# Patient Record
Sex: Male | Born: 1980
Health system: Southern US, Community
[De-identification: ages and names within clinical notes are randomized; demographics above are authoritative.]

## PROBLEM LIST (undated history)

## (undated) DIAGNOSIS — R000 Tachycardia, unspecified: Secondary | ICD-10-CM

## (undated) DIAGNOSIS — R569 Unspecified convulsions: Secondary | ICD-10-CM

## (undated) DIAGNOSIS — A419 Sepsis, unspecified organism: Secondary | ICD-10-CM

## (undated) DIAGNOSIS — E0590 Thyrotoxicosis, unspecified without thyrotoxic crisis or storm: Secondary | ICD-10-CM

## (undated) DIAGNOSIS — F419 Anxiety disorder, unspecified: Secondary | ICD-10-CM

---
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
## 2002-04-24 ENCOUNTER — Encounter: Payer: Self-pay | Admitting: Emergency Medicine

## 2002-04-24 ENCOUNTER — Emergency Department (HOSPITAL_COMMUNITY): Admission: EM | Admit: 2002-04-24 | Discharge: 2002-04-24 | Payer: Self-pay | Admitting: Emergency Medicine

## 2002-05-12 ENCOUNTER — Emergency Department (HOSPITAL_COMMUNITY): Admission: EM | Admit: 2002-05-12 | Discharge: 2002-05-12 | Payer: Self-pay | Admitting: *Deleted

## 2002-05-16 ENCOUNTER — Emergency Department (HOSPITAL_COMMUNITY): Admission: EM | Admit: 2002-05-16 | Discharge: 2002-05-16 | Payer: Self-pay | Admitting: Emergency Medicine

## 2007-06-11 ENCOUNTER — Emergency Department: Payer: Self-pay | Admitting: Emergency Medicine

## 2007-06-14 ENCOUNTER — Emergency Department (HOSPITAL_COMMUNITY): Admission: EM | Admit: 2007-06-14 | Discharge: 2007-06-14 | Payer: Self-pay | Admitting: Emergency Medicine

## 2009-05-04 ENCOUNTER — Emergency Department (HOSPITAL_COMMUNITY): Admission: EM | Admit: 2009-05-04 | Discharge: 2009-05-04 | Payer: Self-pay | Admitting: Emergency Medicine

## 2009-10-13 ENCOUNTER — Emergency Department (HOSPITAL_COMMUNITY): Admission: EM | Admit: 2009-10-13 | Discharge: 2009-10-13 | Payer: Self-pay | Admitting: Emergency Medicine

## 2010-04-25 LAB — WOUND CULTURE

## 2010-05-05 LAB — GC/CHLAMYDIA PROBE AMP, GENITAL
Chlamydia, DNA Probe: NEGATIVE
GC Probe Amp, Genital: NEGATIVE

## 2010-11-06 LAB — COMPREHENSIVE METABOLIC PANEL
ALT: 16
AST: 21
CO2: 26
Calcium: 9.1
Chloride: 109
GFR calc Af Amer: >60
GFR calc non Af Amer: >60
Potassium: 4.1
Sodium: 140

## 2010-11-06 LAB — DIFFERENTIAL
Eosinophils Absolute: 0
Eosinophils Relative: 0
Lymphs Abs: 1.4

## 2010-11-06 LAB — ETHANOL: Alcohol, Ethyl (B): <5

## 2010-11-06 LAB — RAPID URINE DRUG SCREEN, HOSP PERFORMED
Amphetamines: NOT DETECTED
Barbiturates: NOT DETECTED
Benzodiazepines: POSITIVE — AB
Cocaine: NOT DETECTED
Opiates: NOT DETECTED

## 2010-11-06 LAB — CBC
RBC: 4.52
WBC: 10.9 — ABNORMAL HIGH

## 2011-08-11 ENCOUNTER — Emergency Department (HOSPITAL_COMMUNITY)
Admission: EM | Admit: 2011-08-11 | Discharge: 2011-08-11 | Disposition: A | Discharge status: Home / Self Care | Payer: Self-pay | Attending: Emergency Medicine | Admitting: Emergency Medicine

## 2011-08-11 ENCOUNTER — Encounter (HOSPITAL_COMMUNITY): Payer: Self-pay | Admitting: *Deleted

## 2011-08-11 DIAGNOSIS — S30860A Insect bite (nonvenomous) of lower back and pelvis, initial encounter: Secondary | ICD-10-CM | POA: Insufficient documentation

## 2011-08-11 DIAGNOSIS — R21 Rash and other nonspecific skin eruption: Secondary | ICD-10-CM | POA: Insufficient documentation

## 2011-08-11 DIAGNOSIS — L0390 Cellulitis, unspecified: Secondary | ICD-10-CM

## 2011-08-11 DIAGNOSIS — W57XXXA Bitten or stung by nonvenomous insect and other nonvenomous arthropods, initial encounter: Secondary | ICD-10-CM | POA: Insufficient documentation

## 2011-08-11 LAB — GLUCOSE, CAPILLARY: Glucose-Capillary: 94 mg/dL (ref 70–99)

## 2011-08-11 MED ORDER — DOXYCYCLINE HYCLATE 100 MG PO TABS: 100.0000 mg | ORAL_TABLET | Freq: Two times a day (BID) | ORAL | Status: AC

## 2011-08-11 NOTE — ED Notes (Signed)
Removed a tick from RLQ over 1 week ago, now has red , itching areas to abd.  No NVD , or fever.

## 2011-08-11 NOTE — ED Provider Notes (Signed)
History   This chart was scribed for Ronald Lyons, MD by Sofie Rower. The patient was seen in room APA03/APA03 and the patient's care was started at 11:54 AM     CSN: [id number]  Arrival date & time 08/11/11  1120   First MD Initiated Contact with Patient 08/11/11 1144      Chief Complaint  Patient presents with  . Tick Removal    (Consider location/radiation/quality/duration/timing/severity/associated sxs/prior treatment) HPI  PREM COYKENDALL is a 31 y.o. male who presents to the Emergency Department complaining of moderate, episodic rash located at the RLQ onset seven days ago with associated symptoms of itching, erythema. The pt removed a tick one week ago which was located at the RLQ. The pt informs the EDP that the bite location has swelled up and begun itching, progressively becoming worse. The pt also complains of moderate, episodic weakness. The pt would like to have blood work evaluated.  Pt has a hx of allergy to penicillin.   Pt denies fever, headache.       History reviewed. No pertinent past medical history.  History reviewed. No pertinent past surgical history.  History reviewed. No pertinent family history.  History  Substance Use Topics  . Smoking status: Never Smoker   . Smokeless tobacco: Not on file  . Alcohol Use: No      Review of Systems  All other systems reviewed and are negative.    10 Systems reviewed and all are negative for acute change except as noted in the HPI.    Allergies  Penicillins  Home Medications  No current outpatient prescriptions on file.  BP 119/66  Pulse 76  Temp 98.1 F (36.7 C) (Oral)  Resp 18  Ht 5\' 4"  (1.626 m)  Wt 150 lb (68.04 kg)  BMI 25.75 kg/m2  SpO2 100%  Physical Exam  Nursing note and vitals reviewed. Constitutional: He appears well-developed and well-nourished.  HENT:  Head: Atraumatic.  Right Ear: External ear normal.  Left Ear: External ear normal.  Nose: Nose normal.  Musculoskeletal:  Normal range of motion.  Neurological: He is alert.  Skin: Skin is warm and dry. Rash (Located at the RLQ.) noted.       Macular, raised, erythematous rash located at the RLQ. Further area of warmth and erythema located at the suprapubic region.   Psychiatric: He has a normal mood and affect. His behavior is normal.    ED Course  Procedures (including critical care time)  DIAGNOSTIC STUDIES: Oxygen Saturation is 100% on room air, normal by my interpretation.    COORDINATION OF CARE:    11:53AM- EDP at bedside discusses treatment plan concerning treatment of rash with antibiotics.   12:13PM- EDP at bedside discusses results of blood work.   Labs Reviewed - No data to display No results found.   No diagnosis found.    MDM  Has rash where tick was attached.  Does not appear to be in a bullseye configuration, however will treat with doxy.  To return prn if worsens.      I personally performed the services described in this documentation, which was scribed in my presence. The recorded information has been reviewed and considered.      Ronald Lyons, MD 08/12/11 [phone]

## 2011-08-11 NOTE — Discharge Instructions (Signed)
Wood Tick Bite Ticks are insects that attach themselves to the skin. Most tick bites are harmless, but sometimes ticks carry diseases that can make a person quite ill. The chance of getting ill depends on:  The kind of tick that bites you.   Time of year.   How long the tick is attached.   Geographic location.  Wood ticks are also called dog ticks. They are generally black. They can have white markings. They live in shrubs and grassy areas. They are larger than deer ticks. Wood ticks are about the size of a watermelon seed. They have a hard body. The most common places for ticks to attach themselves are the scalp, neck, armpits, waist, and groin. Wood ticks may stay attached for up to 2 weeks. TICKS MUST BE REMOVED AS SOON AS POSSIBLE TO HELP PREVENT DISEASES CAUSED BY TICK BITES.  TO REMOVE A TICK: 1. If available, put on latex gloves before trying to remove a tick.  2. Grasp the tick as close to the skin as possible, with curved forceps, fine tweezers or a special tick removal tool.  3. Pull gently with steady pressure until the tick lets go. Do not twist the tick or jerk it suddenly. This may break off the tick's head or mouth parts.  4. Do not crush the tick's body. This could force disease-carrying fluids from the tick into your body.  5. After the tick is removed, wash the bite area and your hands with soap and water or other disinfectant.  6. Apply a small amount of antiseptic cream or ointment to the bite site.  7. Wash and disinfect any instruments that were used.  8. Save the tick in a jar or plastic bag for later identification. Preserve the tick with a bit of alcohol or put it in the freezer.  9. Do not apply a hot match, petroleum jelly, or fingernail polish to the tick. This does not work and may increase the chances of disease from the tick bite.  YOU MAY NEED TO SEE YOUR CAREGIVER FOR A TETANUS SHOT NOW IF:  You have no idea when you had the last one.   You have never had a  tetanus shot before.  If you need a tetanus shot, and you decide not to get one, there is a rare chance of getting tetanus. Sickness from tetanus can be serious. If you get a tetanus shot, your arm may swell, get red and warm to the touch at the shot site. This is common and not a problem. PREVENTION  Wear protective clothing. Long sleeves and pants are best.   Wear white clothes to see ticks more easily   Tuck your pant legs into your socks.   If walking on trail, stay in the middle of the trail to avoid brushing against bushes.   Put insect repellent on all exposed skin and along boot tops, pant legs and sleeve cuffs   Check clothing, hair and skin repeatedly and before coming inside.   Brush off any ticks that are not attached.  SEEK MEDICAL CARE IF:   You cannot remove a tick or part of the tick that is left in the skin.   Unexplained fever.   Redness and swelling in the area of the tick bite.   Tender, swollen lymph glands.   Diarrhea.   Weight loss.   Cough.   Fatigue.   Muscle, joint or bone pain.   Belly pain.   Headache.   Rash.    SEEK IMMEDIATE MEDICAL CARE IF:   You develop an oral temperature above 102 F (38.9 C).   You are having trouble walking or moving your legs.   Numbness in the legs.   Shortness of breath.   Confusion.   Repeated vomiting.  Document Released: 01/25/2000 Document Revised: 01/16/2011 Document Reviewed: 01/03/2008 ExitCare Patient Information 2012 [employer]. 

## 2013-03-13 ENCOUNTER — Emergency Department (HOSPITAL_COMMUNITY)
Admission: EM | Admit: 2013-03-13 | Discharge: 2013-03-13 | Discharge status: Against Medical Advice | Payer: Self-pay | Attending: Emergency Medicine | Admitting: Emergency Medicine

## 2013-03-13 ENCOUNTER — Encounter (HOSPITAL_COMMUNITY): Payer: Self-pay | Admitting: Emergency Medicine

## 2013-03-13 DIAGNOSIS — F17200 Nicotine dependence, unspecified, uncomplicated: Secondary | ICD-10-CM | POA: Insufficient documentation

## 2013-03-13 DIAGNOSIS — R300 Dysuria: Secondary | ICD-10-CM | POA: Insufficient documentation

## 2013-03-13 NOTE — ED Notes (Signed)
Pt left without being seen after Triage 

## 2013-03-13 NOTE — ED Notes (Signed)
Burning with urination x 3-4 days.  Denies abd pain, denies frequency.

## 2014-02-21 ENCOUNTER — Encounter (HOSPITAL_COMMUNITY): Payer: Self-pay | Admitting: Cardiology

## 2014-02-21 ENCOUNTER — Emergency Department (HOSPITAL_COMMUNITY)
Admission: EM | Admit: 2014-02-21 | Discharge: 2014-02-21 | Disposition: A | Discharge status: Home / Self Care | Payer: Self-pay | Attending: Emergency Medicine | Admitting: Emergency Medicine

## 2014-02-21 DIAGNOSIS — Z880 Allergy status to penicillin: Secondary | ICD-10-CM | POA: Insufficient documentation

## 2014-02-21 DIAGNOSIS — K644 Residual hemorrhoidal skin tags: Secondary | ICD-10-CM | POA: Insufficient documentation

## 2014-02-21 DIAGNOSIS — Z720 Tobacco use: Secondary | ICD-10-CM | POA: Insufficient documentation

## 2014-02-21 HISTORY — DX: Unspecified convulsions: R56.9

## 2014-02-21 MED ORDER — LIDOCAINE 5 % EX OINT: 1.0000 "application " | TOPICAL_OINTMENT | Freq: Four times a day (QID) | CUTANEOUS | Status: DC | PRN

## 2014-02-21 MED ORDER — TRAMADOL HCL 50 MG PO TABS: 50.0000 mg | ORAL_TABLET | Freq: Four times a day (QID) | ORAL | Status: DC | PRN

## 2014-02-21 NOTE — ED Notes (Signed)
Patient called his pharmacy, lidocaine will be $180.00. Discount coupon for lidocaine given for [facility], lidocaine will $44, also gave a pharmacy discount card. EDP also wrote a script for tramadol.

## 2014-02-21 NOTE — ED Provider Notes (Signed)
TIME SEEN: 3:39 PM  CHIEF COMPLAINT: Hemorrhoids  HPI: Pt is a 34 y.o. M with h/o seizures who presents to the ED with hemorrhoids with onset several months ago.  Pt notes pain worsened yesterday. Pt is using Preparation H and Epsom salt soak for relief.  Pt notes associated diarrhea and hematochezia. No abdominal pain. Pt notes he has to lift frequently at work and states this makes his pain worse. Pt denies melena, fever, nausea, and vomiting.  ROS: See HPI Constitutional: no fever  Eyes: no drainage  ENT: no runny nose   Cardiovascular:  no chest pain  Resp: no SOB  GI: no vomiting GU: no dysuria Integumentary: no rash  Allergy: no hives  Musculoskeletal: no leg swelling  Neurological: no slurred speech ROS otherwise negative  PAST MEDICAL HISTORY/PAST SURGICAL HISTORY:  Past Medical History  Diagnosis Date  . Seizures     MEDICATIONS:  Prior to Admission medications   Not on File    ALLERGIES:  Allergies  Allergen Reactions  . Penicillins     SOCIAL HISTORY:  History  Substance Use Topics  . Smoking status: Current Every Day Smoker    Types: Cigarettes  . Smokeless tobacco: Not on file  . Alcohol Use: No    FAMILY HISTORY: History reviewed. No pertinent family history.  EXAM: BP 115/64 mmHg  Pulse 70  Temp(Src) 98.6 F (37 C) (Oral)  Resp 20  Ht 5\' 4"  (1.626 m)  Wt 140 lb (63.504 kg)  BMI 24.02 kg/m2  SpO2 100% CONSTITUTIONAL: Alert and oriented and responds appropriately to questions. Well-appearing; well-nourished HEAD: Normocephalic EYES: Conjunctivae clear, PERRL, no conjunctival pallor ENT: normal nose; no rhinorrhea; moist mucous membranes; pharynx without lesions noted NECK: Supple, no meningismus, no LAD  CARD: RRR; S1 and S2 appreciated; no murmurs, no clicks, no rubs, no gallops RESP: Normal chest excursion without splinting or tachypnea; breath sounds clear and equal bilaterally; no wheezes, no rhonchi, no rales,  ABD/GI: Normal bowel  sounds; non-distended; soft, non-tender, no rebound, no guarding RECTAL: 2 large external non thrombosed, non bleeding hemorrhoids; no rectal prolapse BACK:  The back appears normal and is non-tender to palpation, there is no CVA tenderness EXT: Normal ROM in all joints; non-tender to palpation; no edema; normal capillary refill; no cyanosis    SKIN: Normal color for age and race; warm NEURO: Moves all extremities equally PSYCH: The patient's mood and manner are appropriate. Grooming and personal hygiene are appropriate.  MEDICAL DECISION MAKING: Patient here with external hemorrhoids. They're nonthrombosed. There is no rectal prolapse. Abdominal exam benign. No conjunctival pallor or signs or symptoms to suggest anemia. Hemodynamically stable. Have advised him to continue Preparation H, witch hazel wipes, Epsom salts. We'll give him lidocaine ointment to apply topically. Discussed with patient that narcotic pain medication will make him constipated which can make his symptoms worse. Have advised him to follow up with general surgery if medical management does not improve his symptoms. We'll give work note. Discussed return precautions. He verbalized understanding and is comfortable with plan.      I personally performed the services described in this documentation, which was scribed in my presence. The recorded information has been reviewed and is accurate.    Layla MawKristen N Jillianna Stanek, DO 02/21/14 [id number]

## 2014-02-21 NOTE — ED Notes (Addendum)
Hemorrhoid pain for months.  Pain has been a lot worse since yesterday. Has noticed bright red blood with his bowel movements.

## 2014-02-21 NOTE — Discharge Instructions (Signed)
I recommended you use Preparation H, continue your Epsom salts warm soaks and use witch hazel wipes for your hemorrhoids. I also recommend that you try to keep your stool soft and avoid being constipated.  Please continue to drink plenty of water, eat foods high in fiber. You may use over-the-counter Metamucil, MiraLAX and Colace tablets. Pain medication will make you more constipated which will make your hemorrhoids worse. We are discharging you with lidocaine that you may place on these areas that may help numb them. If you're hemorrhoids continue to bother you and you may follow up with surgery to discuss surgical options.   Hemorrhoids Hemorrhoids are swollen veins around the rectum or anus. There are two types of hemorrhoids:   Internal hemorrhoids. These occur in the veins just inside the rectum. They may poke through to the outside and become irritated and painful.  External hemorrhoids. These occur in the veins outside the anus and can be felt as a painful swelling or hard lump near the anus. CAUSES  Pregnancy.   Obesity.   Constipation or diarrhea.   Straining to have a bowel movement.   Sitting for long periods on the toilet.  Heavy lifting or other activity that caused you to strain.  Anal intercourse. SYMPTOMS   Pain.   Anal itching or irritation.   Rectal bleeding.   Fecal leakage.   Anal swelling.   One or more lumps around the anus.  DIAGNOSIS  Your caregiver may be able to diagnose hemorrhoids by visual examination. Other examinations or tests that may be performed include:   Examination of the rectal area with a gloved hand (digital rectal exam).   Examination of anal canal using a small tube (scope).   A blood test if you have lost a significant amount of blood.  A test to look inside the colon (sigmoidoscopy or colonoscopy). TREATMENT Most hemorrhoids can be treated at home. However, if symptoms do not seem to be getting better or if  you have a lot of rectal bleeding, your caregiver may perform a procedure to help make the hemorrhoids get smaller or remove them completely. Possible treatments include:   Placing a rubber band at the base of the hemorrhoid to cut off the circulation (rubber band ligation).   Injecting a chemical to shrink the hemorrhoid (sclerotherapy).   Using a tool to burn the hemorrhoid (infrared light therapy).   Surgically removing the hemorrhoid (hemorrhoidectomy).   Stapling the hemorrhoid to block blood flow to the tissue (hemorrhoid stapling).  HOME CARE INSTRUCTIONS   Eat foods with fiber, such as whole grains, beans, nuts, fruits, and vegetables. Ask your doctor about taking products with added fiber in them (fibersupplements).  Increase fluid intake. Drink enough water and fluids to keep your urine clear or pale yellow.   Exercise regularly.   Go to the bathroom when you have the urge to have a bowel movement. Do not wait.   Avoid straining to have bowel movements.   Keep the anal area dry and clean. Use wet toilet paper or moist towelettes after a bowel movement.   Medicated creams and suppositories may be used or applied as directed.   Only take over-the-counter or prescription medicines as directed by your caregiver.   Take warm sitz baths for 15-20 minutes, 3-4 times a day to ease pain and discomfort.   Place ice packs on the hemorrhoids if they are tender and swollen. Using ice packs between sitz baths may be helpful.   Put  ice in a plastic bag.   Place a towel between your skin and the bag.   Leave the ice on for 15-20 minutes, 3-4 times a day.   Do not use a donut-shaped pillow or sit on the toilet for long periods. This increases blood pooling and pain.  SEEK MEDICAL CARE IF:  You have increasing pain and swelling that is not controlled by treatment or medicine.  You have uncontrolled bleeding.  You have difficulty or you are unable to have a  bowel movement.  You have pain or inflammation outside the area of the hemorrhoids. MAKE SURE YOU:  Understand these instructions.  Will watch your condition.  Will get help right away if you are not doing well or get worse. Document Released: 01/25/2000 Document Revised: 01/14/2012 Document Reviewed: 12/02/2011 [facility] Patient Information 2015 [place], [place]. This information is not intended to replace advice given to you by your health care provider. Make sure you discuss any questions you have with your health care provider.   High-Fiber Diet Fiber is found in fruits, vegetables, and grains. A high-fiber diet encourages the addition of more whole grains, legumes, fruits, and vegetables in your diet. The recommended amount of fiber for adult males is 38 g per day. For adult females, it is 25 g per day. Pregnant and lactating women should get 28 g of fiber per day. If you have a digestive or bowel problem, ask your caregiver for advice before adding high-fiber foods to your diet. Eat a variety of high-fiber foods instead of only a select few type of foods.  PURPOSE  To increase stool bulk.  To make bowel movements more regular to prevent constipation.  To lower cholesterol.  To prevent overeating. WHEN IS THIS DIET USED?  It may be used if you have constipation and hemorrhoids.  It may be used if you have uncomplicated diverticulosis (intestine condition) and irritable bowel syndrome.  It may be used if you need help with weight management.  It may be used if you want to add it to your diet as a protective measure against atherosclerosis, diabetes, and cancer. SOURCES OF FIBER  Whole-grain breads and cereals.  Fruits, such as apples, oranges, bananas, berries, prunes, and pears.  Vegetables, such as green peas, carrots, sweet potatoes, beets, broccoli, cabbage, spinach, and artichokes.  Legumes, such split peas, soy, lentils.  Almonds. FIBER CONTENT IN FOODS Starches  and Grains / Dietary Fiber (g)  Cheerios, 1 cup / 3 g  Corn Flakes cereal, 1 cup / 0.7 g  Rice crispy treat cereal, 1 cup / 0.3 g  Instant oatmeal (cooked),  cup / 2 g  Frosted wheat cereal, 1 cup / 5.1 g  Brown, long-grain rice (cooked), 1 cup / 3.5 g  White, long-grain rice (cooked), 1 cup / 0.6 g  Enriched macaroni (cooked), 1 cup / 2.5 g Legumes / Dietary Fiber (g)  Baked beans (canned, plain, or vegetarian),  cup / 5.2 g  Kidney beans (canned),  cup / 6.8 g  Pinto beans (cooked),  cup / 5.5 g Breads and Crackers / Dietary Fiber (g)  Plain or honey graham crackers, 2 squares / 0.7 g  Saltine crackers, 3 squares / 0.3 g  Plain, salted pretzels, 10 pieces / 1.8 g  Whole-wheat bread, 1 slice / 1.9 g  White bread, 1 slice / 0.7 g  Raisin bread, 1 slice / 1.2 g  Plain bagel, 3 oz / 2 g  Flour tortilla, 1 oz / 0.9  g  Corn tortilla, 1 small / 1.5 g  Hamburger or hotdog bun, 1 small / 0.9 g Fruits / Dietary Fiber (g)  Apple with skin, 1 medium / 4.4 g  Sweetened applesauce,  cup / 1.5 g  Banana,  medium / 1.5 g  Grapes, 10 grapes / 0.4 g  Orange, 1 small / 2.3 g  Raisin, 1.5 oz / 1.6 g  Melon, 1 cup / 1.4 g Vegetables / Dietary Fiber (g)  Green beans (canned),  cup / 1.3 g  Carrots (cooked),  cup / 2.3 g  Broccoli (cooked),  cup / 2.8 g  Peas (cooked),  cup / 4.4 g  Mashed potatoes,  cup / 1.6 g  Lettuce, 1 cup / 0.5 g  Corn (canned),  cup / 1.6 g  Tomato,  cup / 1.1 g Document Released: 01/27/2005 Document Revised: 07/29/2011 Document Reviewed: 05/01/2011 ExitCare Patient Information 2015 [place], [place]. This information is not intended to replace advice given to you by your health care provider. Make sure you discuss any questions you have with your health care provider.

## 2015-07-28 DIAGNOSIS — F411 Generalized anxiety disorder: Secondary | ICD-10-CM | POA: Diagnosis not present

## 2015-11-01 DIAGNOSIS — K648 Other hemorrhoids: Secondary | ICD-10-CM | POA: Diagnosis not present

## 2015-11-01 NOTE — H&P (Signed)
  NTS SOAP Note  Vital Signs:  Vitals as of: 11/01/2015: Systolic 109: Diastolic 60: Heart Rate 64: Temp 98.12F (Temporal): Height 405ft 4in: Weight 126Lbs 0 Ounces: Pain Level 7: BMI 21.63   BMI : 21.63 kg/m2  Subjective: This 35 year old male presents for of prolapsing hemorrhoid disease.  Referred by Dr. Margo AyeHall.  Has been present for many years.  Recently has had increasing pain and bleeding per rectum.  Creams not helpful.  Feels like hemorrhoid protrudes out, then goes back in spontaneously.  Pain/pressure when out.  Occassional blood on toilet paper when he wipes himself.  No fever, weight loss, family h/o colon cancer.  Review of Symptoms:  Constitutional:negative Head:negative Eyes:pain bilateral sinus problems Cardiovascular:negative Respiratory:negative Gastrointestinnegative Genitourinary:negative Musculoskeletal:negative boils Hematolgic/Lymphatic:negative Allergic/Immunologic:negative   Past Medical History:Reviewed  Past Medical History  Surgical History: none Medical Problems: anxiety Psychiatric History: Anxiety Allergies: pcn Medications: valium   Social History:Reviewed  Social History  Preferred Language: English Race:  White Ethnicity: Not Hispanic / Latino Age: [age] year Marital Status:  S Alcohol: no   Smoking Status: Former smoker reviewed on 11/01/2015 Started Date:  Stopped Date: 02/10/2013 Functional Status reviewed on 11/01/2015 ------------------------------------------------ Bathing: Normal Cooking: Normal Dressing: Normal Driving: Normal Eating: Normal Managing Meds: Normal Oral Care: Normal Shopping: Normal Toileting: Normal Transferring: Normal Walking: Normal Cognitive Status reviewed on 11/01/2015 ------------------------------------------------ Attention: Normal Decision Making: Normal Language: Normal Memory: Normal Motor: Normal Perception: Normal Problem Solving: Normal Visual and Spatial:  Normal   Family History:Reviewed  Family Health History Mother, Healthy;  Father, Healthy;     Objective Information: General:Well appearing, well nourished in no distress. Head:Atraumatic; no masses; no abnormalities Neck:Supple without lymphadenopathy.  Heart:RRR, no murmur or gallop.  Normal S1, S2.  No S3, S4.  Lungs:CTA bilaterally, no wheezes, rhonchi, rales.  Breathing unlabored. Abdomen:Soft, NT/ND, no HSM, no masses. Prolapsing hemorrhoid noted along left aspect of anus.  Smaller one on right side.  Tight sphincter.  No masses.  No active bleeding. Dr. Scharlene GlossHall's notes reviewed. Assessment:Prolapsing hemorrhoidal disease  Diagnoses: 455.8  K64.8 Prolapsed hemorrhoids (Other hemorrhoids)  Procedures: [id number] - OFFICE OUTPATIENT NEW 30 MINUTES    Plan:  Scheduled for extensive hemorrhoidectomy on 11/12/15.   Patient Education:Alternative treatments to surgery were discussed with patient (and family).Risks and benefits  of procedure including bleeding, infection, and recurrence were fully explained to the patient (and family) who gave informed consent. Patient/family questions were addressed.  Follow-up:Pending Surgery

## 2015-11-06 NOTE — Patient Instructions (Signed)
Ronald Hoffman  11/06/2015     @PREFPERIOPPHARMACY @   Your procedure is scheduled on 11/12/2015.  [facility] to [facility] at [facility]:15 [facility].M.  Call this number if you have problems the morning of surgery:  [phone]   Remember:  Do not eat food or drink liquids after midnight.  Take these medicines the morning of surgery with A SIP OF WATER Hydrocodone or Tramadol if needed   Do not wear jewelry, make-up or nail polish.  Do not wear lotions, powders, or perfumes, or deoderant.  Do not shave 48 hours prior to surgery.  Men may shave face and neck.  Do not bring valuables to the hospital.  [facility] any belongings or valuables.  Contacts, dentures or bridgework may not be worn into surgery.  Leave your suitcase in the car.  After surgery it may be brought to your room.  For patients admitted to the hospital, discharge time will be determined by your treatment team.  Patients discharged the day of surgery will not be allowed to drive home.    Please read over the following fact sheets that you were given. Surgical Site Infection Prevention and Anesthesia Post-op Instructions    PATIENT INSTRUCTIONS POST-ANESTHESIA  IMMEDIATELY FOLLOWING SURGERY:  Do not drive or operate machinery for the first twenty four hours after surgery.  Do not make any important decisions for twenty four hours after surgery or while taking narcotic pain medications or sedatives.  If you develop intractable nausea and vomiting or a severe headache please notify your doctor immediately.  FOLLOW-UP:  Please make an appointment with your surgeon as instructed. You do not need to follow up with anesthesia unless specifically instructed to do so.  WOUND CARE INSTRUCTIONS (if applicable):  Keep a dry clean dressing on the anesthesia/puncture wound site if there is drainage.  Once the wound has quit draining you may leave it open to air.  Generally you should leave the bandage intact for twenty  four hours unless there is drainage.  If the epidural site drains for more than 36-48 hours please call the anesthesia department.  QUESTIONS?:  Please feel free to call your physician or the hospital operator if you have any questions, and they will be happy to assist you.      Surgical Procedures for Hemorrhoids Surgical procedures can be used to treat hemorrhoids. Hemorrhoids are swollen veins that are inside the rectum (internal hemorrhoids) or around the anus (external hemorrhoids). They are caused by increased pressure in the anal area. This pressure may result from straining to have a bowel movement (constipation), diarrhea, pregnancy, obesity, anal sex, or sitting for long periods of time. Hemorrhoids can cause symptoms such as pain and bleeding. Surgery may be needed if diet changes, lifestyle changes, and other treatments do not help your symptoms. Various surgical methods may be used. Three common methods are:  Closed hemorrhoidectomy. The hemorrhoids are surgically removed, and the surgical cuts (incisions) are closed with stitches (sutures).  Open hemorrhoidectomy. The hemorrhoids are surgically removed, but the incisions are allowed to heal without sutures.  Stapled hemorrhoidopexy. The hemorrhoids are removed using a device that takes out a ring of excess tissue. LET [facility] KNOW ABOUT:  Any allergies you have.  All medicines you are taking, including vitamins, herbs, eye drops, creams, and over-the-counter medicines.  Previous problems you or members of your family have had with the use of anesthetics.  Any blood disorders you have.  Previous surgeries you have had.  Any medical conditions you have.  Whether you are pregnant or may be pregnant. RISKS AND COMPLICATIONS Generally, this is a safe procedure. However, problems may occur, including:  Infection.  Bleeding.  Allergic reactions to medicines.  Damage to other structures or  organs.  Pain.  Constipation.  Difficulty passing urine.  Narrowing of the anal canal (stenosis).  Difficulty controlling bowel movements (incontinence). BEFORE THE PROCEDURE  Ask your health care provider about:  Changing or stopping your regular medicines. This is especially important if you are taking diabetes medicines or blood thinners.  Taking medicines such as aspirin and ibuprofen. These medicines can thin your blood. Do not take these medicines before your procedure if your health care provider instructs you not to.  You may need to have a procedure to examine the inside of your colon with a scope (colonoscopy). Your health care provider may do this to make sure that there are no other causes for your bleeding or pain.  Follow instructions from your health care provider about eating or drinking restrictions.  You may be instructed to take a laxative and an enema to clean out your colon before surgery (bowel prep). Carefully follow instructions from your health care provider about bowel prep.  Ask your health care provider how your surgical site will be marked or identified.  You may be given antibiotic medicine to help prevent infection.  Plan to have someone take you home after the procedure. PROCEDURE  To reduce your risk of infection:  Your health care team will wash or sanitize their hands.  Your skin will be washed with soap.  An IV tube will be inserted into one of your veins.  You will be given one or more of the following:  A medicine to help you relax (sedative).  A medicine to numb the area (local anesthetic).  A medicine to make you fall asleep (general anesthetic).  A medicine that is injected into an area of your body to numb everything below the injection site (regional anesthetic).  A lubricating jelly may be placed into your rectum.  Your surgeon will insert a short scope (anoscope) into your rectum to examine the hemorrhoids.  One of the  following hemorrhoid procedures will be performed. Closed Hemorrhoidectomy  Your surgeon will use surgical instruments to open the tissue around the hemorrhoids.  The veins that supply the hemorrhoids will be tied off with a suture.  The hemorrhoids will be removed.  The tissue that surrounds the hemorrhoids will be closed with sutures that your body can absorb (absorbable sutures). Open Hemorrhoidectomy  The hemorrhoids will be removed with surgical instruments.  The incisions will be left open to heal without sutures. Stapled Hemorrhoidopexy  Your surgeon will use a circular stapling device to remove the hemorrhoids.  The device will be inserted into your anus. It will remove a circular ring of tissue that includes hemorrhoid tissue and some tissue above the hemorrhoids.  The staples in the device will close the edges of removed tissue. This will cut off the blood supply to the hemorrhoids and will pull any remaining hemorrhoids back into place. Each of these procedures may vary among health care providers and hospitals. AFTER THE PROCEDURE  Your blood pressure, heart rate, breathing rate, and blood oxygen level will be monitored often until the medicines you were given have worn off.  You will be given pain medicine as needed.   This information is not intended to replace advice  given to you by your health care provider. Make sure you discuss any questions you have with your health care provider.   Document Released: 11/24/2008 Document Revised: 10/18/2014 Document Reviewed: 04/24/2014 Elsevier Interactive Patient Education [employer].

## 2015-11-07 ENCOUNTER — Encounter (HOSPITAL_COMMUNITY): Payer: Self-pay

## 2015-11-07 ENCOUNTER — Encounter (HOSPITAL_COMMUNITY)
Admission: RE | Admit: 2015-11-07 | Discharge: 2015-11-07 | Disposition: A | Discharge status: Home / Self Care | Payer: BLUE CROSS/BLUE SHIELD | Source: Ambulatory Visit | Attending: General Surgery | Admitting: General Surgery

## 2015-11-07 DIAGNOSIS — Z01818 Encounter for other preprocedural examination: Secondary | ICD-10-CM | POA: Insufficient documentation

## 2015-11-07 HISTORY — DX: Anxiety disorder, unspecified: F41.9

## 2015-11-07 LAB — BASIC METABOLIC PANEL
Anion gap: 7 (ref 5–15)
BUN: 11 mg/dL (ref 6–20)
CHLORIDE: 102 mmol/L (ref 101–111)
CO2: 27 mmol/L (ref 22–32)
Calcium: 9.1 mg/dL (ref 8.9–10.3)
Creatinine, Ser: 0.97 mg/dL (ref 0.61–1.24)
GFR calc non Af Amer: >60 mL/min (ref >60)
Glucose, Bld: 112 mg/dL — ABNORMAL HIGH (ref 65–99)
POTASSIUM: 3.6 mmol/L (ref 3.5–5.1)
SODIUM: 136 mmol/L (ref 135–145)

## 2015-11-07 LAB — CBC WITH DIFFERENTIAL/PLATELET
BASOS ABS: 0 10*3/uL (ref 0.0–0.1)
Basophils Relative: 0 %
EOS ABS: 0.5 10*3/uL (ref 0.0–0.7)
Eosinophils Relative: 7 %
HCT: 42.2 % (ref 39.0–52.0)
HEMOGLOBIN: 14.4 g/dL (ref 13.0–17.0)
LYMPHS ABS: 2.1 10*3/uL (ref 0.7–4.0)
LYMPHS PCT: 28 %
MCH: 29 pg (ref 26.0–34.0)
MCHC: 34.1 g/dL (ref 30.0–36.0)
MCV: 85.1 fL (ref 78.0–100.0)
Monocytes Absolute: 0.4 10*3/uL (ref 0.1–1.0)
Monocytes Relative: 5 %
NEUTROS PCT: 60 %
Neutro Abs: 4.5 10*3/uL (ref 1.7–7.7)
Platelets: 215 10*3/uL (ref 150–400)
RBC: 4.96 MIL/uL (ref 4.22–5.81)
RDW: 12.9 % (ref 11.5–15.5)
WBC: 7.5 10*3/uL (ref 4.0–10.5)

## 2015-11-07 NOTE — Pre-Procedure Instructions (Signed)
Patient given information to sign up for my chart at home. 

## 2015-11-12 ENCOUNTER — Ambulatory Visit (HOSPITAL_COMMUNITY): Payer: BLUE CROSS/BLUE SHIELD | Admitting: Anesthesiology

## 2015-11-12 ENCOUNTER — Ambulatory Visit (HOSPITAL_COMMUNITY)
Admission: RE | Admit: 2015-11-12 | Discharge: 2015-11-12 | Disposition: A | Discharge status: Home / Self Care | Payer: BLUE CROSS/BLUE SHIELD | Source: Ambulatory Visit | Attending: General Surgery | Admitting: General Surgery

## 2015-11-12 ENCOUNTER — Encounter (HOSPITAL_COMMUNITY)
Admission: RE | Disposition: A | Discharge status: Home / Self Care | Payer: Self-pay | Source: Ambulatory Visit | Attending: General Surgery

## 2015-11-12 ENCOUNTER — Encounter (HOSPITAL_COMMUNITY): Payer: Self-pay | Admitting: *Deleted

## 2015-11-12 DIAGNOSIS — K645 Perianal venous thrombosis: Secondary | ICD-10-CM | POA: Diagnosis not present

## 2015-11-12 DIAGNOSIS — Z79899 Other long term (current) drug therapy: Secondary | ICD-10-CM | POA: Insufficient documentation

## 2015-11-12 DIAGNOSIS — F17200 Nicotine dependence, unspecified, uncomplicated: Secondary | ICD-10-CM | POA: Insufficient documentation

## 2015-11-12 DIAGNOSIS — F419 Anxiety disorder, unspecified: Secondary | ICD-10-CM | POA: Diagnosis not present

## 2015-11-12 DIAGNOSIS — K648 Other hemorrhoids: Secondary | ICD-10-CM | POA: Diagnosis not present

## 2015-11-12 HISTORY — PX: HEMORRHOID SURGERY: SHX153

## 2015-11-12 SURGERY — HEMORRHOIDECTOMY
Anesthesia: General | Site: Rectum

## 2015-11-12 MED ORDER — ROCURONIUM BROMIDE 50 MG/5ML IV SOLN
INTRAVENOUS | Status: AC
  Filled 2015-11-12: qty 1

## 2015-11-12 MED ORDER — HYDROMORPHONE HCL 1 MG/ML IJ SOLN
0.2500 mg | INTRAMUSCULAR | Status: DC | PRN
  Administered 2015-11-12 (×2): 0.5 mg via INTRAVENOUS
  Filled 2015-11-12: qty 1

## 2015-11-12 MED ORDER — BUPIVACAINE HCL (PF) 0.5 % IJ SOLN
INTRAMUSCULAR | Status: DC | PRN
  Administered 2015-11-12: 10 mL

## 2015-11-12 MED ORDER — LIDOCAINE HCL (CARDIAC) 10 MG/ML IV SOLN
INTRAVENOUS | Status: DC | PRN
  Administered 2015-11-12: 50 mg via INTRAVENOUS

## 2015-11-12 MED ORDER — BUPIVACAINE HCL (PF) 0.5 % IJ SOLN
INTRAMUSCULAR | Status: AC
  Filled 2015-11-12: qty 30

## 2015-11-12 MED ORDER — PROPOFOL 10 MG/ML IV BOLUS
INTRAVENOUS | Status: AC
  Filled 2015-11-12: qty 20

## 2015-11-12 MED ORDER — FENTANYL CITRATE (PF) 100 MCG/2ML IJ SOLN
25.0000 ug | INTRAMUSCULAR | Status: AC | PRN
  Administered 2015-11-12 (×2): 25 ug via INTRAVENOUS

## 2015-11-12 MED ORDER — GLYCOPYRROLATE 0.2 MG/ML IJ SOLN
INTRAMUSCULAR | Status: DC | PRN
  Administered 2015-11-12: 0.2 mg via INTRAVENOUS

## 2015-11-12 MED ORDER — LACTATED RINGERS IV SOLN
INTRAVENOUS | Status: DC
  Administered 2015-11-12: 1000 mL via INTRAVENOUS

## 2015-11-12 MED ORDER — CHLORHEXIDINE GLUCONATE CLOTH 2 % EX PADS: 6.0000 | MEDICATED_PAD | Freq: Once | CUTANEOUS | Status: DC

## 2015-11-12 MED ORDER — KETOROLAC TROMETHAMINE 30 MG/ML IJ SOLN
30.0000 mg | Freq: Once | INTRAMUSCULAR | Status: AC
  Administered 2015-11-12: 30 mg via INTRAVENOUS

## 2015-11-12 MED ORDER — KETOROLAC TROMETHAMINE 30 MG/ML IJ SOLN
INTRAMUSCULAR | Status: AC
  Filled 2015-11-12: qty 1

## 2015-11-12 MED ORDER — LIDOCAINE VISCOUS 2 % MT SOLN
OROMUCOSAL | Status: AC
  Filled 2015-11-12: qty 15

## 2015-11-12 MED ORDER — LIDOCAINE VISCOUS 2 % MT SOLN
OROMUCOSAL | Status: DC | PRN
  Administered 2015-11-12: 1

## 2015-11-12 MED ORDER — ONDANSETRON HCL 4 MG/2ML IJ SOLN
4.0000 mg | Freq: Once | INTRAMUSCULAR | Status: AC
  Administered 2015-11-12: 4 mg via INTRAVENOUS

## 2015-11-12 MED ORDER — FENTANYL CITRATE (PF) 100 MCG/2ML IJ SOLN
INTRAMUSCULAR | Status: AC
Start: 2015-11-12 — End: 2015-11-12
  Filled 2015-11-12: qty 2

## 2015-11-12 MED ORDER — SODIUM CHLORIDE 0.9 % IR SOLN
Status: DC | PRN
  Administered 2015-11-12: 1000 mL

## 2015-11-12 MED ORDER — MIDAZOLAM HCL 2 MG/2ML IJ SOLN
INTRAMUSCULAR | Status: AC
  Filled 2015-11-12: qty 2

## 2015-11-12 MED ORDER — ONDANSETRON HCL 4 MG/2ML IJ SOLN
INTRAMUSCULAR | Status: AC
  Filled 2015-11-12: qty 2

## 2015-11-12 MED ORDER — PROPOFOL 10 MG/ML IV BOLUS
INTRAVENOUS | Status: DC | PRN
  Administered 2015-11-12: 200 mg via INTRAVENOUS

## 2015-11-12 MED ORDER — HEMOSTATIC AGENTS (NO CHARGE) OPTIME
TOPICAL | Status: DC | PRN
  Administered 2015-11-12: 1 via TOPICAL

## 2015-11-12 MED ORDER — FENTANYL CITRATE (PF) 250 MCG/5ML IJ SOLN
INTRAMUSCULAR | Status: AC
  Filled 2015-11-12: qty 5

## 2015-11-12 MED ORDER — MIDAZOLAM HCL 2 MG/2ML IJ SOLN
1.0000 mg | INTRAMUSCULAR | Status: DC | PRN
  Administered 2015-11-12: 2 mg via INTRAVENOUS

## 2015-11-12 MED ORDER — OXYCODONE-ACETAMINOPHEN 7.5-325 MG PO TABS: 1.0000 | ORAL_TABLET | ORAL | 0 refills | Status: DC | PRN

## 2015-11-12 MED ORDER — GLYCOPYRROLATE 0.2 MG/ML IJ SOLN
INTRAMUSCULAR | Status: AC
  Filled 2015-11-12: qty 1

## 2015-11-12 MED ORDER — METRONIDAZOLE IN NACL 5-0.79 MG/ML-% IV SOLN
500.0000 mg | INTRAVENOUS | Status: AC
  Administered 2015-11-12: 500 mg via INTRAVENOUS
  Filled 2015-11-12: qty 100

## 2015-11-12 MED ORDER — FENTANYL CITRATE (PF) 100 MCG/2ML IJ SOLN
INTRAMUSCULAR | Status: DC | PRN
  Administered 2015-11-12: 50 ug via INTRAVENOUS
  Administered 2015-11-12: 25 ug via INTRAVENOUS
  Administered 2015-11-12: 50 ug via INTRAVENOUS
  Administered 2015-11-12: 25 ug via INTRAVENOUS

## 2015-11-12 MED ORDER — LIDOCAINE HCL (PF) 1 % IJ SOLN
INTRAMUSCULAR | Status: AC
  Filled 2015-11-12: qty 5

## 2015-11-12 SURGICAL SUPPLY — 32 items
BAG HAMPER (MISCELLANEOUS) ×2 IMPLANT
CLOTH BEACON ORANGE TIMEOUT ST (SAFETY) ×2 IMPLANT
COVER LIGHT HANDLE STERIS (MISCELLANEOUS) ×4 IMPLANT
DECANTER SPIKE VIAL GLASS SM (MISCELLANEOUS) ×2 IMPLANT
DRAPE HALF SHEET 40X57 (DRAPES) ×1 IMPLANT
DRAPE PROXIMA HALF (DRAPES) ×2 IMPLANT
ELECT REM PT RETURN 9FT ADLT (ELECTROSURGICAL) ×2
ELECTRODE REM PT RTRN 9FT ADLT (ELECTROSURGICAL) ×1 IMPLANT
FORMALIN 10 PREFIL 120ML (MISCELLANEOUS) ×2 IMPLANT
GAUZE SPONGE 4X4 12PLY STRL (GAUZE/BANDAGES/DRESSINGS) ×2 IMPLANT
GLOVE BIOGEL PI IND STRL 7.0 (GLOVE) ×1 IMPLANT
GLOVE BIOGEL PI INDICATOR 7.0 (GLOVE) ×1
GLOVE SURG SS PI 7.5 STRL IVOR (GLOVE) ×4 IMPLANT
GOWN STRL REUS W/ TWL XL LVL3 (GOWN DISPOSABLE) ×1 IMPLANT
GOWN STRL REUS W/TWL LRG LVL3 (GOWN DISPOSABLE) ×2 IMPLANT
GOWN STRL REUS W/TWL XL LVL3 (GOWN DISPOSABLE) ×2
HEMOSTAT SURGICEL 4X8 (HEMOSTASIS) ×2 IMPLANT
KIT ROOM TURNOVER AP CYSTO (KITS) ×2 IMPLANT
LIGASURE IMPACT 36 18CM CVD LR (INSTRUMENTS) ×1 IMPLANT
MANIFOLD NEPTUNE II (INSTRUMENTS) ×2 IMPLANT
NDL HYPO 25X1 1.5 SAFETY (NEEDLE) ×1 IMPLANT
NEEDLE HYPO 25X1 1.5 SAFETY (NEEDLE) ×2 IMPLANT
NS IRRIG 1000ML POUR BTL (IV SOLUTION) ×2 IMPLANT
PACK PERI GYN (CUSTOM PROCEDURE TRAY) ×2 IMPLANT
PAD ARMBOARD 7.5X6 YLW CONV (MISCELLANEOUS) ×2 IMPLANT
SET BASIN LINEN APH (SET/KITS/TRAYS/PACK) ×2 IMPLANT
SPONGE GAUZE 4X4 12PLY (GAUZE/BANDAGES/DRESSINGS) ×1 IMPLANT
STRIP SURGICAL 1/2 X 6 IN (GAUZE/BANDAGES/DRESSINGS) ×1 IMPLANT
SURGILUBE 3G PEEL PACK STRL (MISCELLANEOUS) ×2 IMPLANT
SUT SILK 0 FSL (SUTURE) ×2 IMPLANT
SUT VIC AB 2-0 CT2 27 (SUTURE) IMPLANT
SYR CONTROL 10ML LL (SYRINGE) ×2 IMPLANT

## 2015-11-12 NOTE — Anesthesia Procedure Notes (Signed)
Procedure Name: LMA Insertion Date/Time: 11/12/2015 7:41 AM Performed by: Patrcia DollyMOSES, Zimal Weisensel Pre-anesthesia Checklist: Patient identified, Patient being monitored, Timeout performed, Emergency Drugs available and Suction available Patient Re-evaluated:Patient Re-evaluated prior to inductionOxygen Delivery Method: Circle system utilized Preoxygenation: Pre-oxygenation with 100% oxygen Intubation Type: IV induction Ventilation: Mask ventilation without difficulty Tube type: Oral Number of attempts: 1 Placement Confirmation: positive ETCO2 and breath sounds checked- equal and bilateral Tube secured with: Tape Dental Injury: Teeth and Oropharynx as per pre-operative assessment

## 2015-11-12 NOTE — Discharge Instructions (Signed)
Surgical Procedures for Hemorrhoids, Care After °Refer to this sheet in the next few weeks. These instructions provide you with information about caring for yourself after your procedure. Your health care provider may also give you more specific instructions. Your treatment has been planned according to current medical practices, but problems sometimes occur. Call your health care provider if you have any problems or questions after your procedure. °WHAT TO EXPECT AFTER THE PROCEDURE °After your procedure, it is common to have: °· Rectal pain. °· Pain when you are having a bowel movement. °· Slight rectal bleeding. °HOME CARE INSTRUCTIONS °Medicines °· Take over-the-counter and prescription medicines only as told by your health care provider. °· Do not drive or operate heavy machinery while taking prescription pain medicine. °· Use a stool softener or a bulk laxative as told by your health care provider. °Activities °· Rest at home. Return to your normal activities as told by your health care provider. °· Do not lift anything that is heavier than 10 lb (4.5 kg). °· Do not sit for long periods of time. Take a walk every day or as told by your health care provider. °· Do not strain to have a bowel movement. Do not spend a long time sitting on the toilet. °Eating and Drinking °· Eat foods that contain fiber, such as whole grains, beans, nuts, fruits, and vegetables. °· Drink enough fluid to keep your urine clear or pale yellow. °General Instructions °· Sit in a warm bath 2-3 times per day to relieve soreness or itching. °· Keep all follow-up visits as told by your health care provider. This is important. °SEEK MEDICAL CARE IF: °· Your pain medicine is not helping. °· You have a fever or chills. °· You become constipated. °· You have trouble passing urine. °SEEK IMMEDIATE MEDICAL CARE IF: °· You have very bad rectal pain. °· You have heavy bleeding from your rectum. °  °This information is not intended to replace advice  given to you by your health care provider. Make sure you discuss any questions you have with your health care provider. °  °Document Released: 04/19/2003 Document Revised: 10/18/2014 Document Reviewed: 04/24/2014 °Elsevier Interactive Patient Education ©2016 Elsevier Inc. ° °

## 2015-11-12 NOTE — Op Note (Signed)
Patient:  Ronald Hoffman  DOB:  10/17/1980  MRN:  [id number]   Preop Diagnosis:  Prolapsing hemorrhoids  Postop Diagnosis:  Same  Procedure:  Extensive hemorrhoidectomy  Surgeon:  Franky MachoMark Terrius Gentile, M.D.  Anes:  Gen.  Indications:  Patient is a 35 year old white male who presents with prolapsing hemorrhoidal disease. The risks and benefits of the procedure including bleeding, infection, pain, and the possibility of recurrence of the hemorrhoids were fully explained to the patient, who gave informed consent.  Procedure note:  The patient was placed in the lithotomy position after general anesthesia was administered. The perineum was prepped and draped using usual sterile technique with Betadine. Surgical site confirmation was performed.  On examination, the patient had protruding internal and external hemorrhoids at the 4:00 and 10:00 positions. A smaller internal hemorrhoid was noted at the 2:00 position. The internal and external hemorrhoids were removed in a collar-like fashion using the LigaSure. Care was taken to avoid the external sphincter mechanism. The internal hemorrhoid at the 2:00 position was excised using the LigaSure. The rectum was inspected and no bleeding was noted the end of the procedure. 0.5% Sensorcaine was instilled in the surrounding perineum. Surgicel and Viscous Xylocaine rectal packing was then placed.  All tape and needle counts were correct at the end of the procedure. Patient was awakened and transferred to PACU in stable condition.  Complications:  None  EBL:  Minimal  Specimen:  Hemorrhoids

## 2015-11-12 NOTE — Interval H&P Note (Signed)
History and Physical Interval Note:  11/12/2015 7:24 AM  Ronald Hoffman  has presented today for surgery, with the diagnosis of prolapsed hemorrhoids  The various methods of treatment have been discussed with the patient and family. After consideration of risks, benefits and other options for treatment, the patient has consented to  Procedure(s): EXTENSIVE HEMORRHOIDECTOMY (N/A) as a surgical intervention .  The patient's history has been reviewed, patient examined, no change in status, stable for surgery.  I have reviewed the patient's chart and labs.  Questions were answered to the patient's satisfaction.     Franky MachoJENKINS,Eastyn Skalla A

## 2015-11-12 NOTE — Anesthesia Postprocedure Evaluation (Signed)
Anesthesia Post Note  Patient: Ronald Hoffman  Procedure(s) Performed: Procedure(s) (LRB): EXTENSIVE HEMORRHOIDECTOMY (N/A)  Patient location during evaluation: PACU Anesthesia Type: General Level of consciousness: awake and awake and alert Pain management: pain level controlled Respiratory status: spontaneous breathing and patient connected to face mask oxygen Cardiovascular status: stable Anesthetic complications: no    Last Vitals:  Vitals:   11/12/15 0730 11/12/15 0800  BP: (!) 100/59   Pulse:  (!) (P) 59  Resp: 10   Temp:  (P) 37.1 C    Last Pain:  Vitals:   11/12/15 0639  TempSrc: Oral                 Rozalyn Osland

## 2015-11-12 NOTE — Anesthesia Preprocedure Evaluation (Signed)
Anesthesia Evaluation  Patient identified by MRN, date of birth, ID band Patient awake    Reviewed: Allergy & Precautions, NPO status , Patient's Chart, lab work & pertinent test results  Airway Mallampati: I  TM Distance: >3 FB     Dental  (+) Teeth Intact   Pulmonary Current Smoker,    breath sounds clear to auscultation       Cardiovascular negative cardio ROS   Rhythm:Regular Rate:Normal     Neuro/Psych Seizures -, Well Controlled,  PSYCHIATRIC DISORDERS Anxiety    GI/Hepatic negative GI ROS,   Endo/Other    Renal/GU      Musculoskeletal   Abdominal   Peds  Hematology   Anesthesia Other Findings   Reproductive/Obstetrics                             Anesthesia Physical Anesthesia Plan  ASA: II  Anesthesia Plan: General   Post-op Pain Management:    Induction: Intravenous  Airway Management Planned: LMA  Additional Equipment:   Intra-op Plan:   Post-operative Plan: Extubation in OR  Informed Consent: I have reviewed the patients History and Physical, chart, labs and discussed the procedure including the risks, benefits and alternatives for the proposed anesthesia with the patient or authorized representative who has indicated his/her understanding and acceptance.     Plan Discussed with:   Anesthesia Plan Comments:         Anesthesia Quick Evaluation

## 2015-11-12 NOTE — Transfer of Care (Signed)
Immediate Anesthesia Transfer of Care Note  Patient: Ronald Hoffman  Procedure(s) Performed: Procedure(s): EXTENSIVE HEMORRHOIDECTOMY (N/A)  Patient Location: PACU  Anesthesia Type:General  Level of Consciousness: awake, alert , pateint uncooperative and responds to stimulation  Airway & Oxygen Therapy: Patient Spontanous Breathing and Patient connected to face mask oxygen  Post-op Assessment: Report given to RN, Post -op Vital signs reviewed and stable and Patient able to stick tongue midline  Post vital signs: Reviewed and stable  Last Vitals:  Vitals:   11/12/15 0700 11/12/15 0730  BP: 99/61 (!) 100/59  Resp: 15 10  Temp:      Last Pain:  Vitals:   11/12/15 0639  TempSrc: Oral      Patients Stated Pain Goal: 7 (11/12/15 [id number])  Complications: No apparent anesthesia complications

## 2015-11-15 ENCOUNTER — Encounter (HOSPITAL_COMMUNITY): Payer: Self-pay | Admitting: General Surgery

## 2016-02-01 DIAGNOSIS — J060 Acute laryngopharyngitis: Secondary | ICD-10-CM | POA: Diagnosis not present

## 2016-06-03 DIAGNOSIS — Z6822 Body mass index (BMI) 22.0-22.9, adult: Secondary | ICD-10-CM | POA: Diagnosis not present

## 2016-06-03 DIAGNOSIS — J060 Acute laryngopharyngitis: Secondary | ICD-10-CM | POA: Diagnosis not present

## 2016-06-03 DIAGNOSIS — R05 Cough: Secondary | ICD-10-CM | POA: Diagnosis not present

## 2017-01-05 DIAGNOSIS — J069 Acute upper respiratory infection, unspecified: Secondary | ICD-10-CM | POA: Diagnosis not present

## 2017-01-05 DIAGNOSIS — Z0000 Encounter for general adult medical examination without abnormal findings: Secondary | ICD-10-CM | POA: Diagnosis not present

## 2017-06-01 DIAGNOSIS — J019 Acute sinusitis, unspecified: Secondary | ICD-10-CM | POA: Diagnosis not present

## 2018-04-06 DIAGNOSIS — R51 Headache: Secondary | ICD-10-CM | POA: Diagnosis not present

## 2018-04-06 DIAGNOSIS — J019 Acute sinusitis, unspecified: Secondary | ICD-10-CM | POA: Diagnosis not present

## 2018-04-06 DIAGNOSIS — R05 Cough: Secondary | ICD-10-CM | POA: Diagnosis not present

## 2018-04-29 DIAGNOSIS — J069 Acute upper respiratory infection, unspecified: Secondary | ICD-10-CM | POA: Diagnosis not present

## 2018-04-29 DIAGNOSIS — J019 Acute sinusitis, unspecified: Secondary | ICD-10-CM | POA: Diagnosis not present

## 2018-05-13 DIAGNOSIS — J019 Acute sinusitis, unspecified: Secondary | ICD-10-CM | POA: Diagnosis not present

## 2018-05-13 DIAGNOSIS — R05 Cough: Secondary | ICD-10-CM | POA: Diagnosis not present

## 2018-05-13 DIAGNOSIS — R51 Headache: Secondary | ICD-10-CM | POA: Diagnosis not present

## 2018-05-13 DIAGNOSIS — H9209 Otalgia, unspecified ear: Secondary | ICD-10-CM | POA: Diagnosis not present

## 2018-05-25 DIAGNOSIS — R0602 Shortness of breath: Secondary | ICD-10-CM | POA: Diagnosis not present

## 2018-05-25 DIAGNOSIS — R0982 Postnasal drip: Secondary | ICD-10-CM | POA: Diagnosis not present

## 2018-05-25 DIAGNOSIS — R05 Cough: Secondary | ICD-10-CM | POA: Diagnosis not present

## 2018-05-25 DIAGNOSIS — J309 Allergic rhinitis, unspecified: Secondary | ICD-10-CM | POA: Diagnosis not present

## 2018-06-01 DIAGNOSIS — J069 Acute upper respiratory infection, unspecified: Secondary | ICD-10-CM | POA: Diagnosis not present

## 2018-06-01 DIAGNOSIS — R21 Rash and other nonspecific skin eruption: Secondary | ICD-10-CM | POA: Diagnosis not present

## 2018-06-01 DIAGNOSIS — J019 Acute sinusitis, unspecified: Secondary | ICD-10-CM | POA: Diagnosis not present

## 2018-06-01 DIAGNOSIS — L233 Allergic contact dermatitis due to drugs in contact with skin: Secondary | ICD-10-CM | POA: Diagnosis not present

## 2019-10-25 ENCOUNTER — Inpatient Hospital Stay (HOSPITAL_COMMUNITY)
Admission: AD | Admit: 2019-10-25 | Discharge: 2019-10-31 | DRG: 885 | Disposition: A | Discharge status: Home / Self Care | Payer: No Typology Code available for payment source | Attending: Psychiatry | Admitting: Psychiatry

## 2019-10-25 DIAGNOSIS — F22 Delusional disorders: Secondary | ICD-10-CM

## 2019-10-25 DIAGNOSIS — I878 Other specified disorders of veins: Secondary | ICD-10-CM | POA: Diagnosis not present

## 2019-10-25 DIAGNOSIS — Z885 Allergy status to narcotic agent status: Secondary | ICD-10-CM

## 2019-10-25 DIAGNOSIS — Z765 Malingerer [conscious simulation]: Secondary | ICD-10-CM

## 2019-10-25 DIAGNOSIS — Z880 Allergy status to penicillin: Secondary | ICD-10-CM | POA: Diagnosis not present

## 2019-10-25 DIAGNOSIS — F23 Brief psychotic disorder: Secondary | ICD-10-CM | POA: Diagnosis not present

## 2019-10-25 DIAGNOSIS — F419 Anxiety disorder, unspecified: Secondary | ICD-10-CM | POA: Diagnosis not present

## 2019-10-25 DIAGNOSIS — G8929 Other chronic pain: Secondary | ICD-10-CM | POA: Diagnosis present

## 2019-10-25 DIAGNOSIS — F329 Major depressive disorder, single episode, unspecified: Secondary | ICD-10-CM | POA: Diagnosis present

## 2019-10-25 DIAGNOSIS — Z20822 Contact with and (suspected) exposure to covid-19: Secondary | ICD-10-CM | POA: Diagnosis not present

## 2019-10-25 DIAGNOSIS — F1520 Other stimulant dependence, uncomplicated: Secondary | ICD-10-CM | POA: Diagnosis not present

## 2019-10-25 DIAGNOSIS — F1120 Opioid dependence, uncomplicated: Secondary | ICD-10-CM | POA: Diagnosis present

## 2019-10-25 DIAGNOSIS — F17210 Nicotine dependence, cigarettes, uncomplicated: Secondary | ICD-10-CM | POA: Diagnosis not present

## 2019-10-25 DIAGNOSIS — K219 Gastro-esophageal reflux disease without esophagitis: Secondary | ICD-10-CM | POA: Diagnosis present

## 2019-10-25 DIAGNOSIS — R52 Pain, unspecified: Secondary | ICD-10-CM

## 2019-10-25 DIAGNOSIS — F1320 Sedative, hypnotic or anxiolytic dependence, uncomplicated: Secondary | ICD-10-CM | POA: Diagnosis not present

## 2019-10-25 DIAGNOSIS — M545 Low back pain: Secondary | ICD-10-CM | POA: Diagnosis not present

## 2019-10-25 DIAGNOSIS — G4700 Insomnia, unspecified: Secondary | ICD-10-CM | POA: Diagnosis present

## 2019-10-25 DIAGNOSIS — K5900 Constipation, unspecified: Secondary | ICD-10-CM | POA: Diagnosis present

## 2019-10-25 DIAGNOSIS — Z7401 Bed confinement status: Secondary | ICD-10-CM

## 2019-10-25 DIAGNOSIS — R251 Tremor, unspecified: Secondary | ICD-10-CM | POA: Diagnosis present

## 2019-10-25 DIAGNOSIS — I4510 Unspecified right bundle-branch block: Secondary | ICD-10-CM | POA: Diagnosis present

## 2019-10-25 HISTORY — DX: Delusional disorders: F22

## 2019-10-25 LAB — SARS CORONAVIRUS 2 BY RT PCR (HOSPITAL ORDER, PERFORMED IN ~~LOC~~ HOSPITAL LAB): SARS Coronavirus 2: NEGATIVE

## 2019-10-25 MED ORDER — HALOPERIDOL LACTATE 5 MG/ML IJ SOLN
5.0000 mg | Freq: Once | INTRAMUSCULAR | Status: AC
  Filled 2019-10-25: qty 1

## 2019-10-25 MED ORDER — ALUM & MAG HYDROXIDE-SIMETH 200-200-20 MG/5ML PO SUSP
30.0000 mL | ORAL | Status: DC | PRN
  Administered 2019-10-31: 30 mL via ORAL
  Filled 2019-10-25: qty 30

## 2019-10-25 MED ORDER — HYDROXYZINE HCL 25 MG PO TABS
25.0000 mg | ORAL_TABLET | Freq: Three times a day (TID) | ORAL | Status: DC | PRN
Start: 2019-10-25 — End: 2019-10-31
  Administered 2019-10-26 – 2019-10-31 (×6): 25 mg via ORAL
  Filled 2019-10-25 (×6): qty 1

## 2019-10-25 MED ORDER — LORAZEPAM 2 MG/ML IJ SOLN: 1.0000 mg | Freq: Once | INTRAMUSCULAR | Status: AC

## 2019-10-25 MED ORDER — LORAZEPAM 2 MG/ML IJ SOLN
2.0000 mg | Freq: Once | INTRAMUSCULAR | Status: AC
  Administered 2019-10-26: 2 mg via INTRAMUSCULAR

## 2019-10-25 MED ORDER — HALOPERIDOL 5 MG PO TABS
5.0000 mg | ORAL_TABLET | Freq: Once | ORAL | Status: AC
  Administered 2019-10-25: 5 mg via ORAL
  Filled 2019-10-25 (×2): qty 1

## 2019-10-25 MED ORDER — ACETAMINOPHEN 325 MG PO TABS
650.0000 mg | ORAL_TABLET | Freq: Four times a day (QID) | ORAL | Status: DC | PRN
  Administered 2019-10-26 – 2019-10-30 (×7): 650 mg via ORAL
  Filled 2019-10-25 (×8): qty 2

## 2019-10-25 MED ORDER — TRAZODONE HCL 50 MG PO TABS
50.0000 mg | ORAL_TABLET | Freq: Every evening | ORAL | Status: DC | PRN
  Administered 2019-10-26 – 2019-10-29 (×4): 50 mg via ORAL
  Filled 2019-10-25 (×4): qty 1

## 2019-10-25 MED ORDER — NICOTINE POLACRILEX 2 MG MT GUM: 2.0000 mg | CHEWING_GUM | OROMUCOSAL | Status: DC | PRN

## 2019-10-25 MED ORDER — MAGNESIUM HYDROXIDE 400 MG/5ML PO SUSP
30.0000 mL | Freq: Every day | ORAL | Status: DC | PRN
  Administered 2019-10-29: 30 mL via ORAL
  Filled 2019-10-25: qty 30

## 2019-10-25 MED ORDER — LORAZEPAM 1 MG PO TABS
1.0000 mg | ORAL_TABLET | Freq: Once | ORAL | Status: AC
  Administered 2019-10-25: 1 mg via ORAL
  Filled 2019-10-25: qty 1

## 2019-10-25 MED ORDER — LORAZEPAM 2 MG/ML IJ SOLN
INTRAMUSCULAR | Status: AC
  Filled 2019-10-25: qty 1

## 2019-10-25 NOTE — BH Assessment (Signed)
Pt's friend and brother shared their contact information, which is follows:  Bonney Leitz, friend ("son"): [phone]  Jishnu Jenniges, brother: [phone]
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
## 2019-10-25 NOTE — Progress Notes (Addendum)
Pt finally on the unit. Pt agitated, yelling, cursing, walking in other pt's rooms. Pt hard to redirect. Provider notified. Received additional order for 2 mg Ativan IM. Pt agreed to take Haldol 5 mg PO and Ativan 1 mg PO in the search room. Pt very delusional and labile.
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
## 2019-10-26 ENCOUNTER — Other Ambulatory Visit: Payer: Self-pay

## 2019-10-26 ENCOUNTER — Encounter (HOSPITAL_COMMUNITY): Payer: Self-pay | Admitting: Nurse Practitioner

## 2019-10-26 DIAGNOSIS — F22 Delusional disorders: Secondary | ICD-10-CM

## 2019-10-26 DIAGNOSIS — F23 Brief psychotic disorder: Secondary | ICD-10-CM

## 2019-10-26 HISTORY — DX: Brief psychotic disorder: F23

## 2019-10-26 MED ORDER — NICOTINE 21 MG/24HR TD PT24
21.0000 mg | MEDICATED_PATCH | Freq: Every day | TRANSDERMAL | Status: DC
  Administered 2019-10-27 – 2019-10-31 (×5): 21 mg via TRANSDERMAL
  Filled 2019-10-26 (×9): qty 1

## 2019-10-26 MED ORDER — ONDANSETRON 4 MG PO TBDP
4.0000 mg | ORAL_TABLET | Freq: Four times a day (QID) | ORAL | Status: DC | PRN
  Administered 2019-10-27 – 2019-10-28 (×2): 4 mg via ORAL
  Filled 2019-10-26 (×2): qty 1

## 2019-10-26 MED ORDER — OLANZAPINE 10 MG PO TBDP: 10.0000 mg | ORAL_TABLET | Freq: Three times a day (TID) | ORAL | Status: DC | PRN

## 2019-10-26 MED ORDER — CLONIDINE HCL 0.1 MG PO TABS
0.1000 mg | ORAL_TABLET | Freq: Four times a day (QID) | ORAL | Status: DC
  Administered 2019-10-26 – 2019-10-27 (×4): 0.1 mg via ORAL
  Filled 2019-10-26 (×7): qty 1

## 2019-10-26 MED ORDER — HALOPERIDOL 5 MG PO TABS
5.0000 mg | ORAL_TABLET | Freq: Four times a day (QID) | ORAL | Status: DC | PRN
  Administered 2019-10-26 – 2019-10-30 (×3): 5 mg via ORAL
  Filled 2019-10-26 (×3): qty 1

## 2019-10-26 MED ORDER — CLONIDINE HCL 0.1 MG PO TABS: 0.1000 mg | ORAL_TABLET | Freq: Every day | ORAL | Status: DC

## 2019-10-26 MED ORDER — DICYCLOMINE HCL 20 MG PO TABS
20.0000 mg | ORAL_TABLET | Freq: Four times a day (QID) | ORAL | Status: DC | PRN
  Administered 2019-10-27 – 2019-10-28 (×3): 20 mg via ORAL
  Filled 2019-10-26 (×3): qty 1

## 2019-10-26 MED ORDER — OLANZAPINE 5 MG PO TBDP
5.0000 mg | ORAL_TABLET | Freq: Every day | ORAL | Status: DC
  Administered 2019-10-26 – 2019-10-27 (×2): 5 mg via ORAL
  Filled 2019-10-26 (×4): qty 1

## 2019-10-26 MED ORDER — NICOTINE 21 MG/24HR TD PT24
MEDICATED_PATCH | TRANSDERMAL | Status: AC
  Administered 2019-10-26: 21 mg
  Filled 2019-10-26: qty 1

## 2019-10-26 MED ORDER — METHOCARBAMOL 500 MG PO TABS
500.0000 mg | ORAL_TABLET | Freq: Three times a day (TID) | ORAL | Status: DC | PRN
  Administered 2019-10-26 – 2019-10-29 (×6): 500 mg via ORAL
  Filled 2019-10-26 (×7): qty 1

## 2019-10-26 MED ORDER — THIAMINE HCL 100 MG PO TABS
100.0000 mg | ORAL_TABLET | Freq: Every day | ORAL | Status: DC
  Administered 2019-10-26 – 2019-10-31 (×6): 100 mg via ORAL
  Filled 2019-10-26 (×8): qty 1

## 2019-10-26 MED ORDER — ZIPRASIDONE MESYLATE 20 MG IM SOLR: 20.0000 mg | INTRAMUSCULAR | Status: DC | PRN

## 2019-10-26 MED ORDER — LORAZEPAM 1 MG PO TABS
1.0000 mg | ORAL_TABLET | Freq: Four times a day (QID) | ORAL | Status: DC | PRN
  Administered 2019-10-26 – 2019-10-27 (×4): 1 mg via ORAL
  Filled 2019-10-26 (×4): qty 1

## 2019-10-26 MED ORDER — ENSURE ENLIVE PO LIQD
237.0000 mL | Freq: Two times a day (BID) | ORAL | Status: DC
  Administered 2019-10-26 – 2019-10-31 (×10): 237 mL via ORAL

## 2019-10-26 MED ORDER — CLONIDINE HCL 0.1 MG PO TABS
0.1000 mg | ORAL_TABLET | ORAL | Status: DC
  Filled 2019-10-26 (×3): qty 1

## 2019-10-26 MED ORDER — HALOPERIDOL LACTATE 5 MG/ML IJ SOLN
5.0000 mg | Freq: Four times a day (QID) | INTRAMUSCULAR | Status: DC | PRN
  Administered 2019-10-28: 5 mg via INTRAMUSCULAR
  Filled 2019-10-26: qty 1

## 2019-10-26 MED ORDER — LORAZEPAM 1 MG PO TABS
1.0000 mg | ORAL_TABLET | ORAL | Status: AC | PRN
  Administered 2019-10-30: 1 mg via ORAL

## 2019-10-26 MED ORDER — ADULT MULTIVITAMIN W/MINERALS CH
1.0000 | ORAL_TABLET | Freq: Every day | ORAL | Status: DC
  Administered 2019-10-26 – 2019-10-31 (×6): 1 via ORAL
  Filled 2019-10-26 (×9): qty 1

## 2019-10-26 MED ORDER — LOPERAMIDE HCL 2 MG PO CAPS: 2.0000 mg | ORAL_CAPSULE | ORAL | Status: DC | PRN

## 2019-10-26 MED ORDER — OLANZAPINE 10 MG PO TBDP
10.0000 mg | ORAL_TABLET | Freq: Every day | ORAL | Status: DC
  Administered 2019-10-26: 10 mg via ORAL
  Filled 2019-10-26 (×2): qty 1

## 2019-10-26 MED ORDER — LORAZEPAM 1 MG PO TABS
1.0000 mg | ORAL_TABLET | Freq: Four times a day (QID) | ORAL | Status: DC | PRN
  Administered 2019-10-26: 1 mg via ORAL
  Filled 2019-10-26: qty 1

## 2019-10-26 MED ORDER — NAPROXEN 500 MG PO TABS
500.0000 mg | ORAL_TABLET | Freq: Two times a day (BID) | ORAL | Status: AC | PRN
  Administered 2019-10-26 – 2019-10-31 (×7): 500 mg via ORAL
  Filled 2019-10-26 (×7): qty 1

## 2019-10-26 MED ORDER — FOLIC ACID 1 MG PO TABS
1.0000 mg | ORAL_TABLET | Freq: Every day | ORAL | Status: DC
  Administered 2019-10-26 – 2019-10-31 (×6): 1 mg via ORAL
  Filled 2019-10-26 (×8): qty 1

## 2019-10-26 NOTE — Progress Notes (Signed)
Pt seen at nurse's station complaining of the following symptoms: tremors, legs hurting, anxiety, pain 10/10. Pt given PRNs. Pt returned to his room.
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
## 2019-10-26 NOTE — Progress Notes (Signed)
Patient ID: Ronald Hoffman, male   DOB: 1980/07/29, 39 y.o.   MRN: [id number] Patient with b/l arm tremors, complaining of generalized body pains of 7 (10 being worst), complained of feeling anxious, reports that he had been abusing heroine and "pain pills" prior to hospitalization. Patient medicated with Ativan 1mg  PO for anxiety, Robaxin 500mg  and Tylenol 650mg  for pain. Report will be given to oncoming shift RN. Pt also given urine cup for Urine Drug Screen, but states he does not have to urinate yet, and encouraged to bring the urine to the nurses' station once he voids and give to staff.
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
## 2019-10-26 NOTE — Progress Notes (Signed)
Patient ID: Ronald Hoffman, male   DOB: May 19, 1980, 39 y.o.   MRN: [id number] D: Patient with blunted affect, depressed mood, appeared fidgety and restless, but was cooperative this morning. Pt denies SI/HI/AVH.  A: Pt was medicated with Ativan 1mg  and Hydroxyzine 25mg  for anxiety, and also with Haldol 5mg  for psychosis. Pt is resting in bed now with no signs of distress.  R: Will continue to monitor on Q15 minute safety checks [place] NOVEL CORONAVIRUS (COVID-19) DAILY CHECK-OFF SYMPTOMS - answer yes or no to each - every day NO YES  Have you had a fever in the past 24 hours?  . Fever (Temp > 37.80C / 100F) X   Have you had any of these symptoms in the past 24 hours? . New Cough .  Sore Throat  .  Shortness of Breath .  Difficulty Breathing .  Unexplained Body Aches   X   Have you had any one of these symptoms in the past 24 hours not related to allergies?   . Runny Nose .  Nasal Congestion .  Sneezing   X   If you have had runny nose, nasal congestion, sneezing in the past 24 hours, has it worsened?  X   EXPOSURES - check yes or no X   Have you traveled outside the state in the past 14 days?  X   Have you been in contact with someone with a confirmed diagnosis of COVID-19 or PUI in the past 14 days without wearing appropriate PPE?  X   Have you been living in the same home as a person with confirmed diagnosis of COVID-19 or a PUI (household contact)?    X   Have you been diagnosed with COVID-19?    X              What to do next: Answered NO to all: Answered YES to anything:   Proceed with unit schedule Follow the [facility] Inpatient Flowsheet.

## 2019-10-26 NOTE — Progress Notes (Signed)
Pt in his room calling out. Pt states that his legs are hurting badly. Pt has been given all of the pain medication that has been ordered for him. "Can you give me something stronger for pain?" Pt told that he will have to endure until the next scheduled dose of his medications. Will continue to monitor.

## 2019-10-26 NOTE — Progress Notes (Signed)
NUTRITION ASSESSMENT  Pt identified as at risk on the Malnutrition Screen Tool  INTERVENTION: 1. Supplements: Ensure Enlive po BID, each supplement provides 350 kcal and 20 grams of protein 2. Multivitamin with minerals daily   NUTRITION DIAGNOSIS: Unintentional weight loss related to sub-optimal intake as evidenced by pt report.   Goal: Pt to meet >/= 90% of their estimated nutrition needs.  Monitor:  PO intake  Assessment:  Pt admitted with delusional disorder and substance abuse. Pt reports using benzos, opiates and alcohol. Pt with decreased appetite. Will order Ensure supplements and daily MVI.  Height: Ht Readings from Last 1 Encounters:  10/25/19 5' 3.5" (1.613 m)    Weight: Wt Readings from Last 1 Encounters:  10/25/19 64.2 kg    Weight Hx: Wt Readings from Last 10 Encounters:  10/25/19 64.2 kg  11/07/15 57.4 kg  02/21/14 63.5 kg  03/13/13 64.4 kg  08/11/11 68 kg    BMI:  Body mass index is 24.67 kg/m. Pt meets criteria for normal based on current BMI.  Estimated Nutritional Needs: Kcal: 25-30 kcal/kg Protein: > 1 gram protein/kg Fluid: 1 ml/kcal  Diet Order:  Diet Order            Diet regular Room service appropriate? Yes; Fluid consistency: Thin  Diet effective now                Pt is also offered choice of unit snacks mid-morning and mid-afternoon.  Pt is eating as desired.   Lab results and medications reviewed.   Tilda Franco, MS, RD, LDN Inpatient Clinical Dietitian Contact information available via Amion
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
## 2019-10-26 NOTE — Tx Team (Signed)
Interdisciplinary Treatment and Diagnostic Plan Update  10/26/2019 Time of Session: 10:00am ESIQUIO BOESEN MRN: [id number]  Principal Diagnosis: <principal problem not specified>  Secondary Diagnoses: Active Problems:   Delusional disorder ([place])   Current Medications:  Current Facility-Administered Medications  Medication Dose Route Frequency Provider Last Rate Last Admin  . acetaminophen (TYLENOL) tablet 650 mg  650 mg Oral Q6H PRN Lindon Romp A, NP      . alum & mag hydroxide-simeth (MAALOX/MYLANTA) 200-200-20 MG/5ML suspension 30 mL  30 mL Oral Q4H PRN Lindon Romp A, NP      . cloNIDine (CATAPRES) tablet 0.1 mg  0.1 mg Oral QID Sharma Covert, MD       Followed by  . [START ON 10/28/2019] cloNIDine (CATAPRES) tablet 0.1 mg  0.1 mg Oral BH-qamhs Clary, Cordie Grice, MD       Followed by  . [START ON 10/30/2019] cloNIDine (CATAPRES) tablet 0.1 mg  0.1 mg Oral QAC breakfast Sharma Covert, MD      . dicyclomine (BENTYL) tablet 20 mg  20 mg Oral Q6H PRN Sharma Covert, MD      . folic acid (FOLVITE) tablet 1 mg  1 mg Oral Daily Sharma Covert, MD      . haloperidol (HALDOL) tablet 5 mg  5 mg Oral Q6H PRN Lindon Romp A, NP   5 mg at 10/26/19 1001   Or  . haloperidol lactate (HALDOL) injection 5 mg  5 mg Intramuscular Q6H PRN Lindon Romp A, NP      . hydrOXYzine (ATARAX/VISTARIL) tablet 25 mg  25 mg Oral TID PRN Lindon Romp A, NP   25 mg at 10/26/19 1001  . loperamide (IMODIUM) capsule 2-4 mg  2-4 mg Oral PRN Lindon Romp A, NP      . LORazepam (ATIVAN) 2 MG/ML injection           . OLANZapine zydis (ZYPREXA) disintegrating tablet 10 mg  10 mg Oral Q8H PRN Sharma Covert, MD       And  . LORazepam (ATIVAN) tablet 1 mg  1 mg Oral PRN Sharma Covert, MD       And  . ziprasidone (GEODON) injection 20 mg  20 mg Intramuscular PRN Sharma Covert, MD      . LORazepam (ATIVAN) tablet 1 mg  1 mg Oral Q6H PRN Sharma Covert, MD      . magnesium hydroxide (MILK  OF MAGNESIA) suspension 30 mL  30 mL Oral Daily PRN Lindon Romp A, NP      . methocarbamol (ROBAXIN) tablet 500 mg  500 mg Oral Q8H PRN Sharma Covert, MD      . naproxen (NAPROSYN) tablet 500 mg  500 mg Oral BID PRN Sharma Covert, MD      . nicotine (NICODERM CQ - dosed in mg/24 hours) patch 21 mg  21 mg Transdermal Daily Sharma Covert, MD      . OLANZapine zydis (ZYPREXA) disintegrating tablet 10 mg  10 mg Oral QHS Sharma Covert, MD      . OLANZapine zydis [facility]) disintegrating tablet 5 mg  5 mg Oral Daily Sharma Covert, MD      . ondansetron (ZOFRAN-ODT) disintegrating tablet 4 mg  4 mg Oral Q6H PRN Lindon Romp A, NP      . thiamine tablet 100 mg  100 mg Oral Daily Sharma Covert, MD      . traZODone (DESYREL) tablet 50 mg  50 mg Oral QHS PRN Lindon Romp A, NP       PTA Medications: No medications prior to admission.    Patient Stressors: Arts development officer issue Marital or family conflict Substance abuse  Patient Strengths: Motivation for treatment/growth Supportive family/friends  Treatment Modalities: Medication Management, Group therapy, Case management,  1 to 1 session with clinician, Psychoeducation, Recreational therapy.   Physician Treatment Plan for Primary Diagnosis: <principal problem not specified> Long Term Goal(s):     Short Term Goals:    Medication Management: Evaluate patient's response, side effects, and tolerance of medication regimen.  Therapeutic Interventions: 1 to 1 sessions, Unit Group sessions and Medication administration.  Evaluation of Outcomes: Not Met  Physician Treatment Plan for Secondary Diagnosis: Active Problems:   Delusional disorder ([place])  Long Term Goal(s):     Short Term Goals:       Medication Management: Evaluate patient's response, side effects, and tolerance of medication regimen.  Therapeutic Interventions: 1 to 1 sessions, Unit Group sessions and Medication  administration.  Evaluation of Outcomes: Not Met   RN Treatment Plan for Primary Diagnosis: <principal problem not specified> Long Term Goal(s): Knowledge of disease and therapeutic regimen to maintain health will improve  Short Term Goals: Ability to remain free from injury will improve, Ability to verbalize frustration and anger appropriately will improve, Ability to demonstrate self-control, Ability to identify and develop effective coping behaviors will improve and Compliance with prescribed medications will improve  Medication Management: RN will administer medications as ordered by provider, will assess and evaluate patient's response and provide education to patient for prescribed medication. RN will report any adverse and/or side effects to prescribing provider.  Therapeutic Interventions: 1 on 1 counseling sessions, Psychoeducation, Medication administration, Evaluate responses to treatment, Monitor vital signs and CBGs as ordered, Perform/monitor CIWA, COWS, AIMS and Fall Risk screenings as ordered, Perform wound care treatments as ordered.  Evaluation of Outcomes: Not Met   LCSW Treatment Plan for Primary Diagnosis: <principal problem not specified> Long Term Goal(s): Safe transition to appropriate next level of care at discharge, Engage patient in therapeutic group addressing interpersonal concerns.  Short Term Goals: Engage patient in aftercare planning with referrals and resources, Increase social support, Increase ability to appropriately verbalize feelings, Facilitate acceptance of mental health diagnosis and concerns, Identify triggers associated with mental health/substance abuse issues and Increase skills for wellness and recovery  Therapeutic Interventions: Assess for all discharge needs, 1 to 1 time with Social worker, Explore available resources and support systems, Assess for adequacy in community support network, Educate family and significant other(s) on suicide  prevention, Complete Psychosocial Assessment, Interpersonal group therapy.  Evaluation of Outcomes: Not Met   Progress in Treatment: Attending groups: No. Participating in groups: No. Taking medication as prescribed: Yes. Toleration medication: Yes. Family/Significant other contact made: No, will contact:  if consent is given Patient understands diagnosis: No. Discussing patient identified problems/goals with staff: Yes. Medical problems stabilized or resolved: Yes. Denies suicidal/homicidal ideation: Yes. Issues/concerns per patient self-inventory: No.  New problem(s) identified: No, Describe:  none  New Short Term/Long Term Goal(s): medication stabilization, elimination of SI thoughts, development of comprehensive mental wellness plan.   Patient Goals:  "To get out"  Discharge Plan or Barriers:  Patient recently admitted. CSW will continue to follow and assess for appropriate referrals and possible discharge planning.   Reason for Continuation of Hospitalization: Aggression Delusions  Medication stabilization  Estimated Length of Stay: 3-5 days  Attendees: Patient: Darrol Brandenburg 10/26/2019  Physician: Myles Lipps, MD 10/26/2019   Nursing:  10/26/2019  RN Care Manager: 10/26/2019  Social Worker: Darletta Moll, LCSW 10/26/2019   Recreational Therapist:  10/26/2019   Other:  10/26/2019  Other:  10/26/2019   Other: 10/26/2019       Scribe for Treatment Team: Vassie Moselle, LCSW 10/26/2019 10:49 AM

## 2019-10-26 NOTE — Progress Notes (Signed)
Ativan 2 mg IM not given at this time. Went to pt room. Pt taking shower. Pt staying in room at the moment. Will continue to monitor.

## 2019-10-26 NOTE — Progress Notes (Addendum)
Pt has been yelling and screaming and cursing for the last 30 minutes. Pt has awoken the other patients on the hall. Pt came out of his room and was hard to redirect. Pt given Ativan 2 mg IM right deltoid. Pt now out of his room and pulling on the doors. Pt belligerent with staff and trying to pull on panels on the wall. AC called to come and speak with pt. Pt maintains that he has nanites in his blood and they are causing pain in his legs.
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
## 2019-10-26 NOTE — BHH Counselor (Signed)
CSW attempted to complete PSA with this patient, however this patient was sleeping and was unable to be woken.   CSW will attempt to complete assessment at a later time.   Ruthann Cancer MSW, LCSW Clincal Social Worker  [facility]

## 2019-10-26 NOTE — Progress Notes (Signed)
   10/26/19 2204  Psych Admission Type (Psych Patients Only)  Admission Status Voluntary  Psychosocial Assessment  Patient Complaints Anxiety;Agitation;Substance abuse  Eye Contact Intense;Glaring;Watchful  Facial Expression Anxious;Pained  Affect Angry;Anxious;Irritable;Labile;Preoccupied  Speech Logical/coherent;Loud  Interaction Assertive;Dominating;Hostile  Motor Activity Fidgety;Pacing;Restless  Appearance/Hygiene Disheveled  Behavior Characteristics Anxious;Intrusive  Mood Anxious;Preoccupied  Thought Process  Coherency Disorganized  Content Delusions;Preoccupation  Delusions Grandeur;Somatic;Paranoid;Persecutory  Perception WDL  Hallucination None reported or observed  Judgment Impaired  Confusion None  Danger to Self  Current suicidal ideation? Passive  Self-Injurious Behavior No self-injurious ideation or behavior indicators observed or expressed   Agreement Not to Harm Self Yes  Description of Agreement verbal agreement to approach staff  Danger to Others  Danger to Others None reported or observed   Pt intrusive and demanding. Today, more redirectable than last night.

## 2019-10-26 NOTE — Progress Notes (Signed)
   10/25/19 2330  COVID-19 Daily Checkoff  Have you had a fever (temp > 37.80C/100F)  in the past 24 hours?  No  COVID-19 EXPOSURE  Have you traveled outside the state in the past 14 days? No  Have you been in contact with someone with a confirmed diagnosis of COVID-19 or PUI in the past 14 days without wearing appropriate PPE? No  Have you been living in the same home as a person with confirmed diagnosis of COVID-19 or a PUI (household contact)? No  Have you been diagnosed with COVID-19? No

## 2019-10-27 LAB — COMPREHENSIVE METABOLIC PANEL
ALT: 27 U/L (ref 0–44)
AST: 27 U/L (ref 15–41)
Albumin: 4.8 g/dL (ref 3.5–5.0)
Alkaline Phosphatase: 118 U/L (ref 38–126)
Anion gap: 15 (ref 5–15)
BUN: 15 mg/dL (ref 6–20)
CO2: 24 mmol/L (ref 22–32)
Calcium: 10.3 mg/dL (ref 8.9–10.3)
Chloride: 107 mmol/L (ref 98–111)
Creatinine, Ser: 0.89 mg/dL (ref 0.61–1.24)
GFR calc Af Amer: >60 mL/min (ref >60)
GFR calc non Af Amer: >60 mL/min (ref >60)
Glucose, Bld: 92 mg/dL (ref 70–99)
Potassium: 3.5 mmol/L (ref 3.5–5.1)
Sodium: 146 mmol/L — ABNORMAL HIGH (ref 135–145)
Total Bilirubin: 0.8 mg/dL (ref 0.3–1.2)
Total Protein: 8.3 g/dL — ABNORMAL HIGH (ref 6.5–8.1)

## 2019-10-27 LAB — CBC
HCT: 43.9 % (ref 39.0–52.0)
Hemoglobin: 15.3 g/dL (ref 13.0–17.0)
MCH: 28.6 pg (ref 26.0–34.0)
MCHC: 34.9 g/dL (ref 30.0–36.0)
MCV: 82.1 fL (ref 80.0–100.0)
Platelets: 363 10*3/uL (ref 150–400)
RBC: 5.35 MIL/uL (ref 4.22–5.81)
RDW: 12.3 % (ref 11.5–15.5)
WBC: 10.7 10*3/uL — ABNORMAL HIGH (ref 4.0–10.5)
nRBC: 0 % (ref 0.0–0.2)

## 2019-10-27 LAB — RAPID URINE DRUG SCREEN, HOSP PERFORMED
Amphetamines: NOT DETECTED
Barbiturates: NOT DETECTED
Benzodiazepines: POSITIVE — AB
Cocaine: NOT DETECTED
Opiates: POSITIVE — AB
Tetrahydrocannabinol: NOT DETECTED

## 2019-10-27 LAB — ETHANOL: Alcohol, Ethyl (B): <10 mg/dL (ref <10)

## 2019-10-27 LAB — TSH: TSH: 0.247 u[IU]/mL — ABNORMAL LOW (ref 0.350–4.500)

## 2019-10-27 LAB — HEMOGLOBIN A1C
Hgb A1c MFr Bld: 5.2 % (ref 4.8–5.6)
Mean Plasma Glucose: 102.54 mg/dL

## 2019-10-27 MED ORDER — OLANZAPINE 5 MG PO TBDP
25.0000 mg | ORAL_TABLET | Freq: Every day | ORAL | Status: DC
  Administered 2019-10-27: 25 mg via ORAL
  Filled 2019-10-27 (×3): qty 1

## 2019-10-27 MED ORDER — OLANZAPINE 10 MG PO TBDP
20.0000 mg | ORAL_TABLET | Freq: Every day | ORAL | Status: DC
  Filled 2019-10-27 (×2): qty 2

## 2019-10-27 NOTE — Progress Notes (Signed)
Patient has been in his room most of the evening in bed. He was compliant with his medications but has c/o withdrawal symptoms and medicated accordingly. He was encouraged to drink plenty of fluids and was given a pitcher of gatorade. He is delusional reporting that his father is the president,  Jackquline Bosch and if I don't believe him then let him call him. He did request  Adderall which Clinical research associate informed him that was not ordered for him and he is unlikely to get that here. Aked him to speak with the doctor about this medication. Support given and safety maintained with 15 min checks.
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
## 2019-10-27 NOTE — Progress Notes (Signed)
Recreation Therapy Notes  Date: 9.16.21 Time: 0950 Location: [address] Hall Dayroom  Group Topic: Coping Skills  Goal Area(s) Addresses:  Patient will identify positive coping skills. Patient will identify benefit of using coping skills post d/c.  Intervention: Worksheet, Music  Activity: Coping Skills A to Z.  Patients listened to music in the background as they attempted to identify coping skills for each letter of the alphabet.  Education: Pharmacologist, Building control surveyor.   Education Outcome: Acknowledges understanding/In group clarification offered/Needs additional education.   Clinical Observations/Feedback: Pt did not attend group.    Caroll Rancher, LRT/CTRS         Caroll Rancher A 10/27/2019 10:52 AM

## 2019-10-27 NOTE — Progress Notes (Signed)
Pt can be heard in his room shouting. Pt out in the hallway pacing and cursing. Pt redirected back to his room.

## 2019-10-27 NOTE — Progress Notes (Signed)
Dar Note: Patient presents with anxious affect and mood.  Denies suicidal thoughts and auditory hallucination.  Medication given as prescribed.  Routine safety checks maintained every 15 minutes.  Reports withdrawal symptoms: tremors, cramping, irritability, and anxiety during assessment.  Reports poor appetite.  Fluid intake encouraged.  Patient appears drowsy and sedated this evening.  Clonidine held due to low BP.  Patient is safe on the unit.

## 2019-10-27 NOTE — Progress Notes (Signed)
   10/27/19 2100  COVID-19 Daily Checkoff  Have you had a fever (temp > 37.80C/100F)  in the past 24 hours?  No  If you have had runny nose, nasal congestion, sneezing in the past 24 hours, has it worsened? No  COVID-19 EXPOSURE  Have you traveled outside the state in the past 14 days? No  Have you been in contact with someone with a confirmed diagnosis of COVID-19 or PUI in the past 14 days without wearing appropriate PPE? No  Have you been living in the same home as a person with confirmed diagnosis of COVID-19 or a PUI (household contact)? No  Have you been diagnosed with COVID-19? No

## 2019-10-27 NOTE — Progress Notes (Signed)
Patient states that he had a "better day" today since his peers made him feel better. His goal for tomorrow is to feel well enough to go home.

## 2019-10-27 NOTE — Progress Notes (Signed)
Pt still c/o pain in his legs. Tremors visible in pts hands and pt sweating. Pt legs examined for any visible cause of pain other than withdrawal. Skin color appropriate to ethnicity without swelling, redness or injury. Pt has not slept since waking at the beginning of the shift. Pt given Tylenol 650 mg and Ativan 1 mg for withdrawal. Will continue to monitor.

## 2019-10-28 ENCOUNTER — Inpatient Hospital Stay (HOSPITAL_COMMUNITY): Payer: No Typology Code available for payment source

## 2019-10-28 LAB — T4, FREE: Free T4: 1.07 ng/dL (ref 0.61–1.12)

## 2019-10-28 LAB — PROLACTIN: Prolactin: 30.2 ng/mL — ABNORMAL HIGH (ref 4.0–15.2)

## 2019-10-28 MED ORDER — QUETIAPINE FUMARATE 100 MG PO TABS
100.0000 mg | ORAL_TABLET | Freq: Every day | ORAL | Status: DC
  Administered 2019-10-28 – 2019-10-31 (×4): 100 mg via ORAL
  Filled 2019-10-28 (×6): qty 1

## 2019-10-28 MED ORDER — QUETIAPINE FUMARATE 400 MG PO TABS
400.0000 mg | ORAL_TABLET | Freq: Every day | ORAL | Status: DC
  Administered 2019-10-28: 400 mg via ORAL
  Filled 2019-10-28 (×2): qty 1

## 2019-10-28 MED ORDER — LORAZEPAM 1 MG PO TABS
1.0000 mg | ORAL_TABLET | ORAL | Status: DC | PRN
  Filled 2019-10-28: qty 1

## 2019-10-28 MED ORDER — BENZTROPINE MESYLATE 0.5 MG PO TABS
0.5000 mg | ORAL_TABLET | Freq: Two times a day (BID) | ORAL | Status: DC | PRN
  Administered 2019-10-29 – 2019-10-30 (×2): 0.5 mg via ORAL
  Filled 2019-10-28 (×2): qty 1

## 2019-10-28 MED ORDER — RISPERIDONE 2 MG PO TBDP
2.0000 mg | ORAL_TABLET | Freq: Three times a day (TID) | ORAL | Status: DC | PRN
  Administered 2019-10-29: 2 mg via ORAL
  Filled 2019-10-28: qty 1

## 2019-10-28 MED ORDER — ZIPRASIDONE MESYLATE 20 MG IM SOLR: 20.0000 mg | INTRAMUSCULAR | Status: DC | PRN

## 2019-10-28 MED ORDER — GABAPENTIN 300 MG PO CAPS
300.0000 mg | ORAL_CAPSULE | Freq: Three times a day (TID) | ORAL | Status: DC
  Administered 2019-10-28 – 2019-10-31 (×9): 300 mg via ORAL
  Filled 2019-10-28 (×13): qty 1
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
## 2019-10-28 NOTE — Progress Notes (Signed)
Patient starting to become more and more agitated and threatening because he wants more medications for hss withdrawal symptoms and it is too soon. He was reminded that he slept quite a bit on day shift and may be why he can't sleep. He was offered pill form of Haldol but requested IM which he received.

## 2019-10-28 NOTE — Progress Notes (Signed)
   10/28/19 2333  COVID-19 Daily Checkoff  Have you had a fever (temp > 37.80C/100F)  in the past 24 hours?  No  If you have had runny nose, nasal congestion, sneezing in the past 24 hours, has it worsened? No  COVID-19 EXPOSURE  Have you traveled outside the state in the past 14 days? No  Have you been in contact with someone with a confirmed diagnosis of COVID-19 or PUI in the past 14 days without wearing appropriate PPE? No  Have you been living in the same home as a person with confirmed diagnosis of COVID-19 or a PUI (household contact)? No  Have you been diagnosed with COVID-19? No

## 2019-10-28 NOTE — Progress Notes (Signed)
[facility]  10/28/2019 2:04 PM Ronald Hoffman  MRN:  [id number] Subjective:  Pt is seen and examined today. Pt states his mood is better but he still feels depressed. He rates his mood at 4/10 (10 is the best mood) . Pt c/o anxiety states its getting better. He is still very paranoid and states "somebody is after me, people are trying to lock me up", They are looking after me". He is delusional that he is married and he has 2 grown up sons. Pt states " I have to get back to my wife". He states he talked to his parents, brother and son yesterday and they said " They love me". Pt states " I need my Adderall which helps me to focus". 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  When Pt was asked about Nanytes, He states " No Nanytes". Pt didn't sleep well last night. Nursing notes indicate that Pt slept for 1.5 hours. Nurse reported that he kept on waking every hour and demanded his pain and other PRN medications. Pt states his appetite is poor and he tried to eat his lunch but threw up. He complains of Nausea, vomiting and severe leg pain. Pt states he gets angry when he gets leg pain. He states his leg pain is constant and pain medications doesn't work and he would need something stronger. Currently, Pt denies any suicidal ideation, homicidal ideation and, visual and auditory hallucination. Pt denies any headache dizziness, chest pain, SOB, abdominal pain, diarrhea, and constipation. Pt denies any medication side effects and has been tolerating it well. Pt denies any concerns. On examination, Pt is casual, oriented to place and person but not to time. Pt's eye contact is fair. His thought process is cicumstantial. Pt is paranoid and has delusions. Pt's mood is anxious and dysphoric and affect is flat. No SI , HI. AVH . Tremors present. Pt is not responding to internal stimuli. Nursing note indicate Pt reported withdrawal symptoms: tremors, cramping, irritability, and anxiety during assessment.  Reported  poor appetite.  Fluid intake was encouraged.  Patient appeared drowsy and sedated yesterday evening. Patient stated that he had a "better day" today since his peers made him feel better. He was delusional reported that his father is the president,  Ronald Hoffman and if I don't believe him then let him call him. He requested for Adderall. Pt became more and more agitated and threatening because he wanted more medications for his withdrawal symptoms. Pt got Haldol IM. Vitals- Blood pressure (!) 106/50, pulse (!) 105, temperature 98.3 F (36.8 C), temperature source Oral, resp. rate 16, height 5' 3.5" (1.613 m), weight 64.2 kg, SpO2 100 %. Objective : Pt is a39 y.o.malewho [facility] by his brother and his friend (whom pt refers to as his son) due to pt's increasing aggression and delusions. 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  Principal Problem: Acute psychosis (HCC) Diagnosis: Principal Problem:   Acute psychosis (HCC) Active Problems:   Delusional disorder (HCC)  Total Time spent with patient: 20 minutes  Past Psychiatric History:see H&P  Past Medical History:  Past Medical History:  Diagnosis Date  . Anxiety   . Seizures (HCC)    stress related; no more seizures  since first one 10 years ago; on no meds.    Past Surgical History:  Procedure Laterality Date  . HEMORRHOID SURGERY N/A 11/12/2015   Procedure: EXTENSIVE HEMORRHOIDECTOMY;  Surgeon: Franky Macho, MD;  Location: [facility];  Service: General;  Laterality: N/A;   Family History: History reviewed. No pertinent family history. Family Psychiatric  History: See H&P Social History:  Social History   Substance and Sexual Activity  Alcohol Use Yes   Comment: last drink 10/25/2019, pt  said he had 3 beers     Social History   Substance and Sexual Activity  Drug Use Yes  . Types: Heroin, Methamphetamines, Other-see comments   Comment: was using synthetic heroin daily for 2 years but stopped 2 months ago, methamphetamine daily for 3 months until June (last use 10/25/2019- "just a pinch"), "mushrooms yesterday," and "ketamine today"    Social History   Socioeconomic History  . Marital status: Single    Spouse name: Not on file  . Number of children: Not on file  . Years of education: Not on file  . Highest education level: Not on file  Occupational History  . Not on file  Tobacco Use  . Smoking status: Current Every Day Smoker    Packs/day: 0.50    Years: 10.00    Pack years: 5.00    Types: Cigarettes  . Smokeless tobacco: Never Used  Vaping Use  . Vaping Use: Never assessed  Substance and Sexual Activity  . Alcohol use: Yes    Comment: last drink 10/25/2019, pt said he had 3 beers  . Drug use: Yes    Types: Heroin, Methamphetamines, Other-see comments    Comment: was using synthetic heroin daily for 2 years but stopped 2 months ago, methamphetamine daily for 3 months until June (last use 10/25/2019- "just a pinch"), "mushrooms yesterday," and "ketamine today"  . Sexual activity: Never    Birth control/protection: None  Other Topics Concern  . Not on file  Social History Narrative  . Not on file   Social Determinants of Health   Financial Resource Strain:   . Difficulty of Paying Living Expenses: Not on file  Food Insecurity:   . Worried About Programme researcher, broadcasting/film/video in the Last Year: Not on file  . Ran Out of Food in the Last Year: Not on file  Transportation Needs:   . Lack of Transportation (Medical): Not on file  . Lack of Transportation (Non-Medical): Not on file  Physical Activity:   . Days of Exercise per Week: Not on file  . Minutes of Exercise per Session: Not on file  Stress:   . Feeling of Stress : Not on file  Social Connections:   .  Frequency of Communication with Friends and Family: Not on file  . Frequency of Social Gatherings with Friends and Family: Not on file  . Attends Religious Services: Not on file  . Active Member of Clubs or Organizations: Not on file  . Attends Banker Meetings: Not on file  . Marital Status: Not on file   Additional Social History:    Pain Medications: Please see MAR Prescriptions: Please see MAR Over the Counter: Please see MAR History of alcohol / drug use?: Yes Longest period of sobriety (when/how long): Unknown Name of Substance 1: Amphetamine 1 - Age of First Use: Unknown 1 - Amount (size/oz): Pt does not know 1 - Frequency: Daily for 3 months approx March - June 2021 1 - Duration: 3 months 1 -  Last Use / Amount: Today; pt states he used a little amount due to pain Name of Substance 2: Heroin ("synthetic heroin") 2 - Age of First Use: Unknown 2 - Amount (size/oz): 1 gram + 2 - Frequency: Daily for two years; stopped 2 1/2 months ago 2 - Duration: 2 years 2 - Last Use / Amount: 2 1/2 months ago Name of Substance 3: EtOH 3 - Age of First Use: Unknown 3 - Amount (size/oz): 2 24-ounce beers 3 - Frequency: None 3 - Duration: None 3 - Last Use / Amount: Today; drank 2 24-ounce beers; son states it was to assist in getting him to agree to come to hospital              Sleep: Poor  Appetite:  Poor  Current Medications: Current Facility-Administered Medications  Medication Dose Route Frequency Provider Last Rate Last Admin  . acetaminophen (TYLENOL) tablet 650 mg  650 mg Oral Q6H PRN Nira Conn A, NP   650 mg at 10/28/19 1205  . alum & mag hydroxide-simeth (MAALOX/MYLANTA) 200-200-20 MG/5ML suspension 30 mL  30 mL Oral Q4H PRN Nira Conn A, NP      . benztropine (COGENTIN) tablet 0.5 mg  0.5 mg Oral BID PRN Antonieta Pert, MD      . dicyclomine (BENTYL) tablet 20 mg  20 mg Oral Q6H PRN Antonieta Pert, MD   20 mg at 10/28/19 1205  . feeding  supplement (ENSURE ENLIVE) (ENSURE ENLIVE) liquid 237 mL  237 mL Oral BID BM Antonieta Pert, MD   237 mL at 10/28/19 1050  . folic acid (FOLVITE) tablet 1 mg  1 mg Oral Daily Antonieta Pert, MD   1 mg at 10/28/19 1046  . gabapentin (NEURONTIN) capsule 300 mg  300 mg Oral TID Antonieta Pert, MD   300 mg at 10/28/19 1304  . haloperidol (HALDOL) tablet 5 mg  5 mg Oral Q6H PRN Nira Conn A, NP   5 mg at 10/26/19 1001   Or  . haloperidol lactate (HALDOL) injection 5 mg  5 mg Intramuscular Q6H PRN Nira Conn A, NP   5 mg at 10/28/19 0046  . hydrOXYzine (ATARAX/VISTARIL) tablet 25 mg  25 mg Oral TID PRN Jackelyn Poling, NP   25 mg at 10/28/19 1205  . loperamide (IMODIUM) capsule 2-4 mg  2-4 mg Oral PRN Nira Conn A, NP      . LORazepam (ATIVAN) tablet 1 mg  1 mg Oral PRN Antonieta Pert, MD      . risperiDONE (RISPERDAL M-TABS) disintegrating tablet 2 mg  2 mg Oral Q8H PRN Antonieta Pert, MD       And  . LORazepam (ATIVAN) tablet 1 mg  1 mg Oral PRN Antonieta Pert, MD       And  . ziprasidone (GEODON) injection 20 mg  20 mg Intramuscular PRN Antonieta Pert, MD      . magnesium hydroxide (MILK OF MAGNESIA) suspension 30 mL  30 mL Oral Daily PRN Nira Conn A, NP      . methocarbamol (ROBAXIN) tablet 500 mg  500 mg Oral Q8H PRN Antonieta Pert, MD   500 mg at 10/28/19 1205  . multivitamin with minerals tablet 1 tablet  1 tablet Oral Daily Antonieta Pert, MD   1 tablet at 10/28/19 1046  . naproxen (NAPROSYN) tablet 500 mg  500 mg Oral BID PRN Antonieta Pert, MD   500 mg  at 10/26/19 2043  . nicotine (NICODERM CQ - dosed in mg/24 hours) patch 21 mg  21 mg Transdermal Daily Antonieta Pert, MD   21 mg at 10/28/19 1050  . ondansetron (ZOFRAN-ODT) disintegrating tablet 4 mg  4 mg Oral Q6H PRN Nira Conn A, NP   4 mg at 10/27/19 2102  . QUEtiapine (SEROQUEL) tablet 100 mg  100 mg Oral Daily Antonieta Pert, MD   100 mg at 10/28/19 1048  . QUEtiapine (SEROQUEL)  tablet 400 mg  400 mg Oral QHS Antonieta Pert, MD      . thiamine tablet 100 mg  100 mg Oral Daily Antonieta Pert, MD   100 mg at 10/28/19 1046  . traZODone (DESYREL) tablet 50 mg  50 mg Oral QHS PRN Jackelyn Poling, NP   50 mg at 10/27/19 2102    Lab Results:  Results for orders placed or performed during the hospital encounter of 10/25/19 (from the past 48 hour(s))  Rapid urine drug screen (hospital performed)     Status: Abnormal   Collection Time: 10/27/19  5:09 PM  Result Value Ref Range   Opiates POSITIVE (A) NONE DETECTED   Cocaine NONE DETECTED NONE DETECTED   Benzodiazepines POSITIVE (A) NONE DETECTED   Amphetamines NONE DETECTED NONE DETECTED   Tetrahydrocannabinol NONE DETECTED NONE DETECTED   Barbiturates NONE DETECTED NONE DETECTED    Comment: (NOTE) DRUG SCREEN FOR MEDICAL PURPOSES ONLY.  IF CONFIRMATION IS NEEDED FOR ANY PURPOSE, NOTIFY LAB WITHIN 5 DAYS.  LOWEST DETECTABLE LIMITS FOR URINE DRUG SCREEN Drug Class                     Cutoff (ng/mL) Amphetamine and metabolites    1000 Barbiturate and metabolites    200 Benzodiazepine                 200 Tricyclics and metabolites     300 Opiates and metabolites        300 Cocaine and metabolites        300 THC                            50 Performed at [facility], [address] [place], [place] [address]   CBC     Status: Abnormal   Collection Time: 10/27/19  5:33 PM  Result Value Ref Range   WBC 10.7 (H) 4.0 - 10.5 K/uL   RBC 5.35 4.22 - 5.81 MIL/uL   Hemoglobin 15.3 13.0 - 17.0 g/dL   HCT 60.4 39 - 52 %   MCV 82.1 80.0 - 100.0 fL   MCH 28.6 26.0 - 34.0 pg   MCHC 34.9 30.0 - 36.0 g/dL   RDW 54.0 98.1 - 19.1 %   Platelets 363 150 - 400 K/uL   nRBC 0.0 0.0 - 0.2 %    Comment: Performed at [facility], [address], [place], [place] [address]  Comprehensive metabolic panel     Status: Abnormal   Collection Time: 10/27/19  5:33 PM  Result Value Ref Range    Sodium 146 (H) 135 - 145 mmol/L   Potassium 3.5 3.5 - 5.1 mmol/L   Chloride 107 98 - 111 mmol/L   CO2 24 22 - 32 mmol/L   Glucose, Bld 92 70 - 99 mg/dL    Comment: Glucose reference range applies only to samples taken after fasting for at  least 8 hours.   BUN 15 6 - 20 mg/dL   Creatinine, Ser 4.19 0.61 - 1.24 mg/dL   Calcium 37.9 8.9 - 02.4 mg/dL   Total Protein 8.3 (H) 6.5 - 8.1 g/dL   Albumin 4.8 3.5 - 5.0 g/dL   AST 27 15 - 41 U/L   ALT 27 0 - 44 U/L   Alkaline Phosphatase 118 38 - 126 U/L   Total Bilirubin 0.8 0.3 - 1.2 mg/dL   GFR calc non Af Amer >60 >60 mL/min   GFR calc Af Amer >60 >60 mL/min   Anion gap 15 5 - 15    Comment: Performed at [facility] [place] [facility], [address], [place], [place] [address]  Ethanol     Status: None   Collection Time: 10/27/19  5:33 PM  Result Value Ref Range   Alcohol, Ethyl (B) <10 <10 mg/dL    Comment: (NOTE) Lowest detectable limit for serum alcohol is 10 mg/dL.  For medical purposes only. Performed at [facility], [address] [place], [place] [address]   Hemoglobin A1c     Status: None   Collection Time: 10/27/19  5:33 PM  Result Value Ref Range   Hgb A1c MFr Bld 5.2 4.8 - 5.6 %    Comment: (NOTE) Pre diabetes:          5.7%-6.4%  Diabetes:              >6.4%  Glycemic control for   <7.0% adults with diabetes    Mean Plasma Glucose 102.54 mg/dL    Comment: Performed at [facility], [address] [place], [place] [address]  Prolactin     Status: Abnormal   Collection Time: 10/27/19  5:33 PM  Result Value Ref Range   Prolactin 30.2 (H) 4.0 - 15.2 ng/mL    Comment: (NOTE) Performed At: [facility] [address] [place], [place] [id number] Jolene Schimke MD LG:[id number]   TSH     Status: Abnormal   Collection Time: 10/27/19  5:33 PM  Result Value Ref Range   TSH 0.247 (L) 0.350 - 4.500 uIU/mL    Comment: Performed by a 3rd Generation assay with a functional  sensitivity of <=0.01 uIU/mL. Performed at [facility], [address] [place], [place] [address]     Blood Alcohol level:  Lab Results  Component Value Date   ETH <10 10/27/2019   [facility]  06/14/2007    <5        LOWEST DETECTABLE LIMIT FOR SERUM ALCOHOL IS 11 mg/dL FOR MEDICAL PURPOSES ONLY    Metabolic Disorder Labs: Lab Results  Component Value Date   HGBA1C 5.2 10/27/2019   MPG 102.54 10/27/2019   Lab Results  Component Value Date   PROLACTIN 30.2 (H) 10/27/2019   No results found for: CHOL, TRIG, HDL, CHOLHDL, VLDL, LDLCALC  Physical Findings: AIMS: Facial and Oral Movements Muscles of Facial Expression: None, normal Lips and Perioral Area: None, normal Jaw: None, normal Tongue: None, normal,Extremity Movements Upper (arms, wrists, hands, fingers): None, normal Lower (legs, knees, ankles, toes): None, normal, Trunk Movements Neck, shoulders, hips: None, normal, Overall Severity Severity of abnormal movements (highest score from questions above): None, normal Incapacitation due to abnormal movements: None, normal Patient's awareness of abnormal movements (rate only patient's report): No Awareness, Dental Status Current problems with teeth and/or dentures?: No Does patient usually wear dentures?: No  CIWA:  CIWA-Ar Total: 2 COWS:  COWS Total Score:  3  Musculoskeletal: Strength & Muscle Tone: within normal limits Gait & Station: normal Patient leans: N/A  Psychiatric Specialty Exam: Physical Exam Vitals and nursing note reviewed.  Constitutional:      General: He is not in acute distress.    Appearance: Normal appearance. He is not toxic-appearing or diaphoretic.  HENT:     Head: Normocephalic and atraumatic.  Pulmonary:     Effort: Pulmonary effort is normal.  Neurological:     General: No focal deficit present.     Mental Status: He is alert and oriented to person, place, and time.     Review of Systems  Constitutional: Positive  for appetite change and fatigue. Negative for chills and fever.  Respiratory: Negative for chest tightness and shortness of breath.   Cardiovascular: Negative for chest pain and palpitations.  Gastrointestinal: Positive for nausea and vomiting. Negative for abdominal pain, constipation and diarrhea.  Neurological: Positive for tremors. Negative for numbness and headaches.  Psychiatric/Behavioral: Positive for agitation, dysphoric mood and sleep disturbance. Negative for hallucinations. The patient is nervous/anxious.     Blood pressure (!) 106/50, pulse (!) 105, temperature 98.3 F (36.8 C), temperature source Oral, resp. rate 16, height 5' 3.5" (1.613 m), weight 64.2 kg, SpO2 100 %.Body mass index is 24.67 kg/m.  General Appearance: Casual  Eye Contact:  Fair  Speech:  Normal Rate  Volume:  Decreased  Mood:  Depressed and Dysphoric  Affect:  Flat  Thought Process:  Coherent and Descriptions of Associations: Circumstantial  Orientation:  Full (Time, Place, and Person)  Thought Content:  Delusions, Hallucinations: None and Paranoid Ideation  Suicidal Thoughts:  No  Homicidal Thoughts:  No  Memory:  Immediate;   Poor Recent;   Poor Remote;   Poor  Judgement:  Impaired  Insight:  Lacking  Psychomotor Activity:  Decreased  Concentration:  Concentration: Poor and Attention Span: Poor  Recall:  Poor  Fund of Knowledge:  Poor  Language:  Fair  Akathisia:  Negative  Handed:  Right  AIMS (if indicated):     Assets:  Desire for Improvement Resilience  ADL's:  Intact  Cognition:  WNL  Sleep:  Number of Hours: 1.5     Treatment Plan Summary: Pt is a39 y.o.malewho was voluntarily brought to [facility] by his brother and his friend (whom pt refers to as his son) due to pt's increasing aggression and delusions. 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  On examination, Pt is casual, oriented to place and person but not to time. Pt's eye contact is fair. His thought process is cicumstantial. Pt is paranoid and has delusions. Pt's mood is anxious and dysphoric and affect is flat. No SI , HI. AVH . Tremors present. Pt is not responding to internal stimuli. Blood pressure (!) 106/50, pulse (!) 105, temperature 98.3 F (36.8 C), temperature source Oral, resp. rate 16, height 5' 3.5" (1.613 m), weight 64.2 kg, SpO2 100 %. Labs- Urine toxicology was positive for Benzodiazepine and Opiates. Alcohol level <10,  Prolactin- 30.2 Glucose- 92, HbA1C- 5.2, TSH- 0.247 Na- 146, K- 3.5, LFT- WNL, Total Protein 8.3 Plan- Daily contact with patient to assess and evaluate symptoms and progress in treatment  -Send T3 and T4 - Monitor Vitals. - Monitor  for withdrawal symptoms. - Monitor for medication side effects. - Agitation Protocol -Continue Seroquel 100 mg Daily and 400 mg QHS -Start Gabapentin 300 mg three times Daily -Continue Cogentin 0.5 mg BID PRN for Tremors - Continue Ensure BID Between meals. -ContinueClonidine 0.1 mg taper -Continue bentyl 20 mg Every 6 hours PRN for spasm -Continue Folvite 1 mg Daily -Continue Haldol 5 mg oral or IM and or Lorazepam Oral/IM Q6H PRN for Agitation and withdrawal for CIWA >10 -Continue Zyprexa 10 mg Q6H PRN for Agitation -Continue Geodon 20 mg IM PRN for Agitation -Continue Zyprexa 5 mg Daily And increase to 25 mg Daily at bedtime for Psychosis. -Zofran ODT 4 mg Q^H PRN for Nausea -Thiamine 100 mg Daily -Continue Imodium 2-4 mg Oral as needed -ContinueRobaxin 500mg  2 times daily PRN for pain -Nicoderm patch 2 times daily as needed. -Continue Hydroxyzine 25 mg TID PRN for Anxiety. -Continue Trazodone 50 mg QHS PRN for sleep.  , MD 10/28/2019, 2:04 PM

## 2019-10-29 LAB — T3, FREE: T3, Free: 2.5 pg/mL (ref 2.0–4.4)

## 2019-10-29 MED ORDER — DICYCLOMINE HCL 20 MG PO TABS
20.0000 mg | ORAL_TABLET | Freq: Three times a day (TID) | ORAL | Status: DC
  Administered 2019-10-29 – 2019-10-30 (×5): 20 mg via ORAL
  Filled 2019-10-29 (×13): qty 1

## 2019-10-29 MED ORDER — FAMOTIDINE 20 MG PO TABS
20.0000 mg | ORAL_TABLET | Freq: Two times a day (BID) | ORAL | Status: DC
  Administered 2019-10-29 – 2019-10-31 (×4): 20 mg via ORAL
  Filled 2019-10-29 (×6): qty 1

## 2019-10-29 MED ORDER — QUETIAPINE FUMARATE 300 MG PO TABS
600.0000 mg | ORAL_TABLET | Freq: Every day | ORAL | Status: DC
  Administered 2019-10-29: 600 mg via ORAL
  Filled 2019-10-29 (×3): qty 2
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
## 2019-10-29 NOTE — Progress Notes (Signed)
[place] NOVEL CORONAVIRUS (COVID-19) DAILY CHECK-OFF SYMPTOMS - answer yes or no to each - every day NO YES  Have you had a fever in the past 24 hours?  . Fever (Temp > 37.80C / 100F) X   Have you had any of these symptoms in the past 24 hours? . New Cough .  Sore Throat  .  Shortness of Breath .  Difficulty Breathing .  Unexplained Body Aches   X   Have you had any one of these symptoms in the past 24 hours not related to allergies?   . Runny Nose .  Nasal Congestion .  Sneezing   X   If you have had runny nose, nasal congestion, sneezing in the past 24 hours, has it worsened?  X   EXPOSURES - check yes or no X   Have you traveled outside the state in the past 14 days?  X   Have you been in contact with someone with a confirmed diagnosis of COVID-19 or PUI in the past 14 days without wearing appropriate PPE?  X   Have you been living in the same home as a person with confirmed diagnosis of COVID-19 or a PUI (household contact)?    X   Have you been diagnosed with COVID-19?    X              What to do next: Answered NO to all: Answered YES to anything:   Proceed with unit schedule Follow the [facility] Inpatient Flowsheet.   Pt A & O X3. Visible in hall and in dayroom at intervals. Presents with blunted affect, anxious mood, fidgety / restless and delusional / disorganized at intervals on interactions. Approached writer this afternoon requesting his discharge papers "I need my papers to go home. They told me on Monday but I got to leave now to go to church tomorrow. They told me all my stuff is done to leave". Pt observed with bilateral hand tremors. Reports mild constipation "I had a BM this morning but I did not get all out". Emotional support and encouargement offered to pt as needed throughout this shift. Scheduled and PRN medications (MOM, Robaxin) given with verbal education and effects monitored. Q 15 minutes safety checks maintained without self harm gestures.  Pt tolerated  meals and fluids well. Interacted well with peers in dayroom. Safety maintained.

## 2019-10-29 NOTE — Progress Notes (Signed)
[facility] MD Progress Note  10/29/2019 1:15 PM Ronald Hoffman  MRN:  [id number] Subjective:  Pt is seen and examined today. Pt states his mood is good. He rates his mood at 6 /10 (10 is the best mood). Pt didn't sleep well last night because of restlessness in legs and pain. Nursing notes indicate that Pt slept for 3 hours. Pt states his appetite is poor and he ate some breakfast only. Pt rates his anxiety at 6/10 (10 is the worst anxiety). He states he wants to go home to his wife. Patient is still paranoid and delusional. Upon asking if he has kids he states" My wife is pregnant and will be delivering soon so they are on the way".  He insists on going home to his wife. 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 Pt c/o constant leg pain and fidgedity in legs. Pt states he fell from a 3 story building and didn't see a doctor after that. He states Neurontin is helping him with his pain but its not enough and he needs high dose. He complains of Nausea. Currently, Pt denies any suicidal ideation, homicidal ideation and, visual and auditory hallucination. Pt denies any headache, vomiting, dizziness, chest pain, SOB, abdominal pain, diarrhea, and constipation. Pt denies any medication side effects and has been tolerating it well. Pt denies any concerns.  On examination, Pt is casual, oriented x3. Pt's eye contact is fair. His thought process is cicumstantial. Pt is paranoid and has delusions. Pt's mood is anxious and dysphoric and affect is flat. No SI , HI. AVH . Tremors present. Pt is not responding to internal stimuli. Pt has a drug seeking behavior and has been demanding all his PRN medications.  Blood pressure (!) 106/50, pulse (!) 105, temperature 98.3 F (36.8 C), temperature source Oral, resp. rate 16, height 5' 3.5" (1.613 m), weight 64.2 kg, SpO2 100 %. Objective : Pt is a39 y.o.malewho was voluntarily brought to [facility] by his brother and his friend (whom pt refers to as his son) due to  pt's increasing aggression and delusions. Pt shares he came to [facility] tonight because, "I have digital nayites all throughout my body."On initial evaluation by NP and Counsellor, Pt stated thatthe nanytes have been in his body for years and that they were put into him, without his consent, by the [employer], which he owns.  Principal Problem: Acute psychosis (HCC) Diagnosis: Principal Problem:   Acute psychosis (HCC) Active Problems:   Delusional disorder (HCC)  Total Time spent with patient: 20 minutes  Past Psychiatric History: See H&P  Past Medical History:  Past Medical History:  Diagnosis Date  . Anxiety   . Seizures (HCC)    stress related; no more seizures since first one 10 years ago; on no meds.    Past Surgical History:  Procedure Laterality Date  . HEMORRHOID SURGERY N/A 11/12/2015   Procedure: EXTENSIVE HEMORRHOIDECTOMY;  Surgeon: Franky Macho, MD;  Location: [facility];  Service: General;  Laterality: N/A;   Family History: History reviewed. No pertinent family history. Family Psychiatric  History: see H&P Social History:  Social History   Substance and Sexual Activity  Alcohol Use Yes   Comment: last drink 10/25/2019, pt said he had 3 beers     Social History   Substance and Sexual Activity  Drug Use Yes  . Types: Heroin, Methamphetamines, Other-see comments   Comment: was using synthetic heroin daily for 2 years but stopped 2 months ago, methamphetamine daily for 3  months until June (last use 10/25/2019- "just a pinch"), "mushrooms yesterday," and "ketamine today"    Social History   Socioeconomic History  . Marital status: Single    Spouse name: Not on file  . Number of children: Not on file  . Years of education: Not on file  . Highest education level: Not on file  Occupational History  . Not on file  Tobacco Use  . Smoking status: Current Every Day Smoker    Packs/day: 0.50    Years: 10.00    Pack years: 5.00    Types: Cigarettes  . Smokeless tobacco:  Never Used  Vaping Use  . Vaping Use: Never assessed  Substance and Sexual Activity  . Alcohol use: Yes    Comment: last drink 10/25/2019, pt said he had 3 beers  . Drug use: Yes    Types: Heroin, Methamphetamines, Other-see comments    Comment: was using synthetic heroin daily for 2 years but stopped 2 months ago, methamphetamine daily for 3 months until June (last use 10/25/2019- "just a pinch"), "mushrooms yesterday," and "ketamine today"  . Sexual activity: Never    Birth control/protection: None  Other Topics Concern  . Not on file  Social History Narrative  . Not on file   Social Determinants of Health   Financial Resource Strain:   . Difficulty of Paying Living Expenses: Not on file  Food Insecurity:   . Worried About Programme researcher, broadcasting/film/videounning Out of Food in the Last Year: Not on file  . Ran Out of Food in the Last Year: Not on file  Transportation Needs:   . Lack of Transportation (Medical): Not on file  . Lack of Transportation (Non-Medical): Not on file  Physical Activity:   . Days of Exercise per Week: Not on file  . Minutes of Exercise per Session: Not on file  Stress:   . Feeling of Stress : Not on file  Social Connections:   . Frequency of Communication with Friends and Family: Not on file  . Frequency of Social Gatherings with Friends and Family: Not on file  . Attends Religious Services: Not on file  . Active Member of Clubs or Organizations: Not on file  . Attends BankerClub or Organization Meetings: Not on file  . Marital Status: Not on file   Additional Social History:    Pain Medications: Please see MAR Prescriptions: Please see MAR Over the Counter: Please see MAR History of alcohol / drug use?: Yes Longest period of sobriety (when/how long): Unknown Name of Substance 1: Amphetamine 1 - Age of First Use: Unknown 1 - Amount (size/oz): Pt does not know 1 - Frequency: Daily for 3 months approx March - June 2021 1 - Duration: 3 months 1 - Last Use / Amount: Today; pt  states he used a little amount due to pain Name of Substance 2: Heroin ("synthetic heroin") 2 - Age of First Use: Unknown 2 - Amount (size/oz): 1 gram + 2 - Frequency: Daily for two years; stopped 2 1/2 months ago 2 - Duration: 2 years 2 - Last Use / Amount: 2 1/2 months ago Name of Substance 3: EtOH 3 - Age of First Use: Unknown 3 - Amount (size/oz): 2 24-ounce beers 3 - Frequency: None 3 - Duration: None 3 - Last Use / Amount: Today; drank 2 24-ounce beers; son states it was to assist in getting him to agree to come to hospital              Sleep:  Poor  Appetite:  Poor  Current Medications: Current Facility-Administered Medications  Medication Dose Route Frequency Provider Last Rate Last Admin  . acetaminophen (TYLENOL) tablet 650 mg  650 mg Oral Q6H PRN Nira Conn A, NP   650 mg at 10/29/19 0829  . alum & mag hydroxide-simeth (MAALOX/MYLANTA) 200-200-20 MG/5ML suspension 30 mL  30 mL Oral Q4H PRN Nira Conn A, NP      . benztropine (COGENTIN) tablet 0.5 mg  0.5 mg Oral BID PRN Antonieta Pert, MD      . dicyclomine (BENTYL) tablet 20 mg  20 mg Oral TID AC & HS Karsten Ro, MD   20 mg at 10/29/19 1218  . famotidine (PEPCID) tablet 20 mg  20 mg Oral BID AC Rafel Garde, MD      . feeding supplement (ENSURE ENLIVE) (ENSURE ENLIVE) liquid 237 mL  237 mL Oral BID BM Antonieta Pert, MD   237 mL at 10/29/19 1000  . folic acid (FOLVITE) tablet 1 mg  1 mg Oral Daily Antonieta Pert, MD   1 mg at 10/29/19 0803  . gabapentin (NEURONTIN) capsule 300 mg  300 mg Oral TID Antonieta Pert, MD   300 mg at 10/29/19 1218  . haloperidol (HALDOL) tablet 5 mg  5 mg Oral Q6H PRN Nira Conn A, NP   5 mg at 10/26/19 1001   Or  . haloperidol lactate (HALDOL) injection 5 mg  5 mg Intramuscular Q6H PRN Nira Conn A, NP   5 mg at 10/28/19 0046  . hydrOXYzine (ATARAX/VISTARIL) tablet 25 mg  25 mg Oral TID PRN Jackelyn Poling, NP   25 mg at 10/28/19 2134  . LORazepam (ATIVAN) tablet  1 mg  1 mg Oral PRN Antonieta Pert, MD      . risperiDONE (RISPERDAL M-TABS) disintegrating tablet 2 mg  2 mg Oral Q8H PRN Antonieta Pert, MD       And  . LORazepam (ATIVAN) tablet 1 mg  1 mg Oral PRN Antonieta Pert, MD       And  . ziprasidone (GEODON) injection 20 mg  20 mg Intramuscular PRN Antonieta Pert, MD      . magnesium hydroxide (MILK OF MAGNESIA) suspension 30 mL  30 mL Oral Daily PRN Nira Conn A, NP      . methocarbamol (ROBAXIN) tablet 500 mg  500 mg Oral Q8H PRN Antonieta Pert, MD   500 mg at 10/28/19 2349  . multivitamin with minerals tablet 1 tablet  1 tablet Oral Daily Antonieta Pert, MD   1 tablet at 10/29/19 0803  . naproxen (NAPROSYN) tablet 500 mg  500 mg Oral BID PRN Antonieta Pert, MD   500 mg at 10/28/19 2350  . nicotine (NICODERM CQ - dosed in mg/24 hours) patch 21 mg  21 mg Transdermal Daily Antonieta Pert, MD   21 mg at 10/29/19 0807  . QUEtiapine (SEROQUEL) tablet 100 mg  100 mg Oral Daily Antonieta Pert, MD   100 mg at 10/29/19 0803  . QUEtiapine (SEROQUEL) tablet 600 mg  600 mg Oral QHS Manila Rommel, MD      . thiamine tablet 100 mg  100 mg Oral Daily Antonieta Pert, MD   100 mg at 10/29/19 0803  . traZODone (DESYREL) tablet 50 mg  50 mg Oral QHS PRN Jackelyn Poling, NP   50 mg at 10/28/19 2134    Lab Results:  Results for orders  placed or performed during the hospital encounter of 10/25/19 (from the past 48 hour(s))  Rapid urine drug screen (hospital performed)     Status: Abnormal   Collection Time: 10/27/19  5:09 PM  Result Value Ref Range   Opiates POSITIVE (A) NONE DETECTED   Cocaine NONE DETECTED NONE DETECTED   Benzodiazepines POSITIVE (A) NONE DETECTED   Amphetamines NONE DETECTED NONE DETECTED   Tetrahydrocannabinol NONE DETECTED NONE DETECTED   Barbiturates NONE DETECTED NONE DETECTED    Comment: (NOTE) DRUG SCREEN FOR MEDICAL PURPOSES ONLY.  IF CONFIRMATION IS NEEDED FOR ANY PURPOSE, NOTIFY LAB WITHIN  5 DAYS.  LOWEST DETECTABLE LIMITS FOR URINE DRUG SCREEN Drug Class                     Cutoff (ng/mL) Amphetamine and metabolites    1000 Barbiturate and metabolites    200 Benzodiazepine                 200 Tricyclics and metabolites     300 Opiates and metabolites        300 Cocaine and metabolites        300 THC                            50 Performed at [facility], [address] [place], [place] [address]   CBC     Status: Abnormal   Collection Time: 10/27/19  5:33 PM  Result Value Ref Range   WBC 10.7 (H) 4.0 - 10.5 K/uL   RBC 5.35 4.22 - 5.81 MIL/uL   Hemoglobin 15.3 13.0 - 17.0 g/dL   HCT 13.0 39 - 52 %   MCV 82.1 80.0 - 100.0 fL   MCH 28.6 26.0 - 34.0 pg   MCHC 34.9 30.0 - 36.0 g/dL   RDW 86.5 78.4 - 69.6 %   Platelets 363 150 - 400 K/uL   nRBC 0.0 0.0 - 0.2 %    Comment: Performed at [facility], [address], [place], [place] [address]  Comprehensive metabolic panel     Status: Abnormal   Collection Time: 10/27/19  5:33 PM  Result Value Ref Range   Sodium 146 (H) 135 - 145 mmol/L   Potassium 3.5 3.5 - 5.1 mmol/L   Chloride 107 98 - 111 mmol/L   CO2 24 22 - 32 mmol/L   Glucose, Bld 92 70 - 99 mg/dL    Comment: Glucose reference range applies only to samples taken after fasting for at least 8 hours.   BUN 15 6 - 20 mg/dL   Creatinine, Ser 4.13 0.61 - 1.24 mg/dL   Calcium 24.4 8.9 - 01.0 mg/dL   Total Protein 8.3 (H) 6.5 - 8.1 g/dL   Albumin 4.8 3.5 - 5.0 g/dL   AST 27 15 - 41 U/L   ALT 27 0 - 44 U/L   Alkaline Phosphatase 118 38 - 126 U/L   Total Bilirubin 0.8 0.3 - 1.2 mg/dL   GFR calc non Af Amer >60 >60 mL/min   GFR calc Af Amer >60 >60 mL/min   Anion gap 15 5 - 15    Comment: Performed at [facility], [address] [place], [place] [address]  Ethanol     Status: None   Collection Time: 10/27/19  5:33 PM  Result Value Ref Range   Alcohol, Ethyl (B) <10 <10 mg/dL  Comment:  (NOTE) Lowest detectable limit for serum alcohol is 10 mg/dL.  For medical purposes only. Performed at [facility], [address], [place], [place] [address]   Hemoglobin A1c     Status: None   Collection Time: 10/27/19  5:33 PM  Result Value Ref Range   Hgb A1c MFr Bld 5.2 4.8 - 5.6 %    Comment: (NOTE) Pre diabetes:          5.7%-6.4%  Diabetes:              >6.4%  Glycemic control for   <7.0% adults with diabetes    Mean Plasma Glucose 102.54 mg/dL    Comment: Performed at [facility], [address], [place], [place] [address]  Prolactin     Status: Abnormal   Collection Time: 10/27/19  5:33 PM  Result Value Ref Range   Prolactin 30.2 (H) 4.0 - 15.2 ng/mL    Comment: (NOTE) Performed At: [facility] [address] [place], [place] [id number] Jolene Schimke MD IW:[id number]   TSH     Status: Abnormal   Collection Time: 10/27/19  5:33 PM  Result Value Ref Range   TSH 0.247 (L) 0.350 - 4.500 uIU/mL    Comment: Performed by a 3rd Generation assay with a functional sensitivity of <=0.01 uIU/mL. Performed at [facility], [address], [place], [place] [address]   T4, free     Status: None   Collection Time: 10/27/19  5:33 PM  Result Value Ref Range   Free T4 1.07 0.61 - 1.12 ng/dL    Comment: (NOTE) Biotin ingestion may interfere with free T4 tests. If the results are inconsistent with the TSH level, previous test results, or the clinical presentation, then consider biotin interference. If needed, order repeat testing after stopping biotin. Performed at [facility], [address] [place], [place] [address]   T3, free     Status: None   Collection Time: 10/27/19  5:33 PM  Result Value Ref Range   T3, Free 2.5 2.0 - 4.4 pg/mL    Comment: (NOTE) Performed At: [facility] [address] [place], [place] [id number] Jolene Schimke MD [id number]     Blood Alcohol level:  Lab Results   Component Value Date   ETH <10 10/27/2019   [facility]  06/14/2007    <5        LOWEST DETECTABLE LIMIT FOR SERUM ALCOHOL IS 11 mg/dL FOR MEDICAL PURPOSES ONLY    Metabolic Disorder Labs: Lab Results  Component Value Date   HGBA1C 5.2 10/27/2019   MPG 102.54 10/27/2019   Lab Results  Component Value Date   PROLACTIN 30.2 (H) 10/27/2019   No results found for: CHOL, TRIG, HDL, CHOLHDL, VLDL, LDLCALC  Physical Findings: AIMS: Facial and Oral Movements Muscles of Facial Expression: None, normal Lips and Perioral Area: None, normal Jaw: None, normal Tongue: None, normal,Extremity Movements Upper (arms, wrists, hands, fingers): None, normal Lower (legs, knees, ankles, toes): None, normal, Trunk Movements Neck, shoulders, hips: None, normal, Overall Severity Severity of abnormal movements (highest score from questions above): None, normal Incapacitation due to abnormal movements: None, normal Patient's awareness of abnormal movements (rate only patient's report): No Awareness, Dental Status Current problems with teeth and/or dentures?: No Does patient usually wear dentures?: No  CIWA:  CIWA-Ar Total: 3 COWS:  COWS Total Score: 4  Musculoskeletal: Strength & Muscle Tone: within normal limits Gait & Station: normal Patient leans: N/A  Psychiatric Specialty Exam: Physical Exam Vitals and nursing note reviewed.  Constitutional:      General: He is not in acute distress.    Appearance: Normal appearance. He is not ill-appearing, toxic-appearing or diaphoretic.  HENT:     Head: Normocephalic and atraumatic.  Pulmonary:     Effort: Pulmonary effort is normal.  Neurological:     General: No focal deficit present.     Mental Status: He is alert and oriented to person, place, and time.     Review of Systems  Constitutional: Positive for appetite change and fatigue. Negative for activity change and fever.  Respiratory: Negative for chest tightness and shortness of breath.    Cardiovascular: Negative for chest pain and palpitations.  Gastrointestinal: Positive for nausea. Negative for abdominal pain, constipation, diarrhea and vomiting.  Psychiatric/Behavioral: Positive for dysphoric mood. Negative for hallucinations and suicidal ideas. The patient is nervous/anxious.     Blood pressure (!) 115/93, pulse (!) 105, temperature 98.3 F (36.8 C), temperature source Oral, resp. rate 16, height 5' 3.5" (1.613 m), weight 64.2 kg, SpO2 97 %.Body mass index is 24.67 kg/m.  General Appearance: Casual  Eye Contact:  Fair  Speech:  Normal Rate  Volume:  Decreased  Mood:  Anxious and Dysphoric  Affect:  Flat  Thought Process:  Coherent and Descriptions of Associations: Circumstantial  Orientation:  Full (Time, Place, and Person)  Thought Content:  Delusions, Hallucinations: None and Paranoid Ideation  Suicidal Thoughts:  No  Homicidal Thoughts:  No  Memory:  Immediate;   Poor Recent;   Poor Remote;   Poor  Judgement:  Impaired  Insight:  Lacking  Psychomotor Activity:  Decreased  Concentration:  Concentration: Poor and Attention Span: Poor  Recall:  Poor  Fund of Knowledge:  Poor  Language:  Fair  Akathisia:  Negative  Handed:  Right  AIMS (if indicated):     Assets:  Desire for Improvement Resilience  ADL's:  Intact  Cognition:  WNL  Sleep:  Number of Hours: 3     Treatment Plan Summary:Objective : Pt is a39 y.o.malewho was voluntarily brought to [facility] by his brother and his friend (whom pt refers to as his son) due to pt's increasing aggression and delusions. Pt shares he came to [facility] tonight because, "I have digital nayites all throughout my body."On initial evaluation by NP and Counsellor, Pt stated thatthe nanytes have been in his body for years and that they were put into him, without his consent, by [employer], which he owns. On examination, Pt is casual, oriented x3. Pt's eye contact is fair. His thought process is cicumstantial. Pt is paranoid and  has delusions. Pt's mood is anxious and dysphoric and affect is flat. No SI , HI. AVH . Tremors present. Pt is not responding to internal stimuli. Pt has a drug seeking behavior and has been demanding all his PRN medications.  Blood pressure (!) 106/50, pulse (!) 105, temperature 98.3 F (36.8 C), temperature source Oral, resp. rate 16, height 5' 3.5" (1.613 m), weight 64.2 kg, SpO2 100 %. Labs- X Ray Lumber spine- Negative X ray Pelvis - Negative Plan-  Daily contact with patient to assess and evaluate symptoms and progress in treatment Monitor Vitals. - Monitor for withdrawal symptoms. - Monitor for medication side effects. - Agitation Protocol -Continue Seroquel 100 mg Daily and increase Night time Seroquel to 600 mg QHS -Continue Gabapentin 300 mg three times Daily -Continue Cogentin 0.5 mg BID PRN for Tremors - Continue Ensure  BID Between meals. -Change Bentyl 20 mg to TID -Start Pepcid 20 mg Daily before food. -Continue Folvite 1 mg Daily -Continue Haldol 5 mg oral or IM and or Lorazepam Oral/IM Q6H PRN for Agitation and withdrawal for CIWA >10 -Continue Zyprexa 10 mg Q6H PRN for Agitation -Continue Geodon 20 mg IM PRN for Agitation -Thiamine 100 mg Daily -ContinueRobaxin 500mg  2 times daily PRN for pain -Nicoderm patch 2 times daily as needed. -Continue Hydroxyzine 25 mg TID PRN for Anxiety. -Continue Trazodone 50 mg QHS PRN for sleep. -Continue Milk of Mg 30 ml for Constipation - Continue Multivitamin with Minerals Daily.  , MD 10/29/2019, 1:15 PM

## 2019-10-29 NOTE — [facility] Group Notes (Signed)
.  Psychoeducational Group Note    Date: 10-29-19 Time: 0900-1000    Goal Setting   Purpose of Group: This group helps to provide patients with the steps of setting a goal that is specific, measurable, attainable, realistic and time specific. A discussion on how we keep ourselves stuck with negative self talk.    Participation Level:  Active  Participation Quality:  Appropriate  Affect:  Appropriate  Cognitive:  Appropriate  Insight:  Improving  Engagement in Group:  Engaged  Additional Comments:  Pt was able to attend to the group and to partisipate. States "I have no idea why I am here. MY wife is pregnant and I think I am here for a psych evaluation.  Dione Housekeeper

## 2019-10-29 NOTE — [facility] Group Notes (Signed)
.  Psychoeducational Group Note    Date: 10-29-2019 Time: 1300-1400    Life Skills: This group discusses  Purpose of Group: . The group focus' on teaching patients on how to identify their needs and how to develop the coping skills needed to get their needs met  Participation Level:  Active  Participation Quality:  Appropriate  Affect:  Appropriate  Cognitive:  Oriented  Insight:  Improving  Engagement in Group:  Engaged  Additional Comments:  Pt adied to the groups participation. Is able to participate within the group, but seems to lack knowledge of why he is here in the hospital   Ronald Hoffman A

## 2019-10-30 LAB — TSH: TSH: 0.36 u[IU]/mL (ref 0.350–4.500)

## 2019-10-30 MED ORDER — QUETIAPINE FUMARATE 400 MG PO TABS
800.0000 mg | ORAL_TABLET | Freq: Every day | ORAL | Status: DC
  Administered 2019-10-31: 800 mg via ORAL
  Filled 2019-10-30: qty 4
  Filled 2019-10-30 (×2): qty 2

## 2019-10-30 MED ORDER — BENZTROPINE MESYLATE 1 MG PO TABS
1.0000 mg | ORAL_TABLET | Freq: Two times a day (BID) | ORAL | Status: DC | PRN
  Administered 2019-10-31: 1 mg via ORAL
  Filled 2019-10-30: qty 1

## 2019-10-30 NOTE — [facility] Group Notes (Signed)
Adult Psychoeducational Group Not Date:  10/30/2019 Time:  1000-1045 Group Topic/Focus: PROGRESSIVE RELAXATION. A group where deep breathing is taught and tensing and relaxation muscle groups is used. Imagery is used as well.  Pts are asked to imagine 3 pillars that hold them up when they are not able to hold themselves up.  Participation Level:  Active  Participation Quality:  Appropriate  Affect:  Appropriate  Cognitive:  Oriented  Insight: Improving  Engagement in Group:  Engaged  Modes of Intervention:  Activity, Discussion, Education, and Support  Additional Comments: Rates his energy at a 2. States he here for a psych evaluation. Was fully present throughout the group time.   Dione Housekeeper 10/30/2019

## 2019-10-30 NOTE — Plan of Care (Signed)
Progress note  D: pt found in bed; allowed to rest. Pt does have complaints of tremors in their arms and legs still. Pt was provided prn's. Pt has been pleasant and seen out of their bedroom pacing in the hallway. Pt has been seen in group and in the dayroom interacting with peers. Pt denies si/hi/ah/vh and verbally agrees to approach staff if these become apparent or before harming themself/others while at [facility].  A: Pt provided support and encouragement. Pt given medication per protocol and standing orders. Q57m safety checks implemented and continued.  R: Pt safe on the unit. Will continue to monitor.  Pt progressing in the following metrics  Problem: Education: Goal: Verbalization of understanding the information provided will improve Outcome: Progressing   Problem: Activity: Goal: Interest or engagement in activities will improve Outcome: Progressing Goal: Sleeping patterns will improve Outcome: Progressing   Problem: Health Behavior/Discharge Planning: Goal: Identification of resources available to assist in meeting health care needs will improve Outcome: Progressing

## 2019-10-30 NOTE — [facility] Suicide Risk Assessment (Signed)
[facility] INPATIENT:  Family/Significant Other Suicide Prevention Education   Suicide Prevention Education: Education Completed; Mother and Father Harriett Sine and Leretha Pol [phone]), has been identified by the patient as the family member/significant other with whom the patient will be residing, and identified as the person(s) who will aid the patient in the event of a mental health crisis (suicidal ideations/suicide attempt).  With written consent from the patient, the family member/significant other has been provided the following suicide prevention education, prior to the and/or following the discharge of the patient.  The suicide prevention education provided includes the following:  Suicide risk factors  Suicide prevention and interventions  National Suicide Hotline telephone number  [facility] assessment telephone number  [facility] Emergency Assistance 911  [facility] and/or Residential Mobile Crisis Unit telephone number   Request made of family/significant other to:  Remove weapons (e.g., guns, rifles, knives), all items previously/currently identified as safety concern.    Remove drugs/medications (over-the-counter, prescriptions, illicit drugs), all items previously/currently identified as a safety concern.   The family member/significant other verbalizes understanding of the suicide prevention education information provided.  The family member/significant other agrees to remove the items of safety concern listed above.  CSW spoke with this patients family who stated that this patient has become symptomatic over the last month becoming aggressive, talking to himself, and delusional. They reported that he has a trespassing charge due to stalking a lady he briefly dated years ago. This patient believes that this lady is his wife and that they have children together, however patients family stated that this is not the case and that he is not married and does not  have children. Per patients family this patient has been using Heroin, Xanax, and Methadone for the past year. Patients family stated that this patient has court on 11/01/19 for the trespassing charges and is supposed to have a consultation with his lawyer, Boyd Kerbs [phone]) on Monday at 3pm.  Patients family stated that this patient is able to return home to live with them at discharge.   Ruthann Cancer MSW, LCSW Clincal Social Worker  [facility]

## 2019-10-30 NOTE — Progress Notes (Signed)
Adult Psychoeducational Group Note  Date:  10/30/2019 Time:  2:24 AM  Group Topic/Focus:  Wrap-Up Group:   The focus of this group is to help patients review their daily goal of treatment and discuss progress on daily workbooks.  Participation Level:  Active  Participation Quality:  Appropriate  Affect:  Appropriate  Cognitive:  Appropriate  Insight: Appropriate  Engagement in Group:  Improving  Modes of Intervention:  Discussion  Additional Comments:  Pt stated his goal for today was to focus on his treatment plan and interact with peers more. Pt stated he felt he accomplished his goals today. Pt stated he talk with his doctor and social worker, regarding his care today. Pt stated been able to contact his son, and parents today improved his overall day.  Pt stated he took all his medication today from his providers. Pt rated his overall day a 6 out of 10. Pt stated his appetite was poor  today. Pt stated his sleep last night was fair. Pt stated the goal for tonight was to get some rest. Pt stated he was in some physical pain. Pt stated he was having pain in both legs. Pt rated the pain level a 10. Pt nurse was made aware of the situation. Pt deny auditory or visual hallucinations. Pt denies thoughts of harming himself or others. Pt stated he would alert staff if anything changes.  Felipa Furnace 10/30/2019, 2:24 AM

## 2019-10-30 NOTE — [facility] Group Notes (Signed)
LCSW Group Therapy 10/30/19 11:00 AM   Type of Therapy and Topic:  Group Therapy:  Setting Goals   Participation Level:  Active   Description of Group: In this process group, patients discussed using strengths to work toward goals and address challenges.  Patients identified two positive things about themselves and one goal they were working on.  Patients were given the opportunity to share openly and support each other's plan for self-empowerment.  The group discussed the value of gratitude and were encouraged to have a daily reflection of positive characteristics or circumstances.  Patients were encouraged to identify a plan to utilize their strengths to work on current challenges and goals.   Therapeutic Goals 1. Patient will verbalize personal strengths/positive qualities and relate how these can assist with achieving desired personal goals 2. Patients will verbalize affirmation of peers plans for personal change and goal setting 3. Patients will explore the value of gratitude and positive focus as related to successful achievement of goals 4. Patients will verbalize a plan for regular reinforcement of personal positive qualities and circumstances.   Summary of Patient Progress: Patient identified the definition of goals. Patients was given the opportunity to share openly and support other group members' plan for self-empowerment. Patient verbalized personal strength and how they relate to achieving the desired goal. Patient was able to identify positive goals to work towards when he returns home. He identified his family as his motivation live a better life.        Therapeutic Modalities Cognitive Behavioral Therapy Motivational Interviewing

## 2019-10-30 NOTE — Progress Notes (Signed)
[facility] MD Progress Note  10/30/2019 1:15 PM Ronald Hoffman  MRN:  [id number] Subjective: Patient is a 39 year old male who was admitted secondary to increasing aggression, delusional thinking and substance related issues.  He reportedly had been abusing ketamine as well as methamphetamines.  He was admitted on 10/26/2019.  Objective: Patient is seen and examined.  Patient is a 39 year old male with the above-stated past psychiatric history who is seen in follow-up.  He seems to be improving.  His speech, mood and affect are significantly improved.  He is not thinking about delusional things such as knowing the president, or other grandiose thinking.  He denied any auditory hallucinations.  He did state that he sees "lint in the air".  He stated that one of the reasons why was in the hospital for detox, and recognizes what happened with the substances.  He denied suicidal or homicidal ideation.  He did state that he had a court date for trespassing on Tuesday.  His vital signs are stable, he is afebrile.  He continues to have episodes of tachycardia.  His most recent CIWA this morning was 7.  His lumbar spine x-rays were negative.  His pelvic x-rays were negative.  His EKG on admission showed a sinus rhythm with an incomplete right bundle branch block and a normal QTc interval.  He slept 5.5 hours.  Principal Problem: Acute psychosis (HCC) Diagnosis: Principal Problem:   Acute psychosis (HCC) Active Problems:   Delusional disorder (HCC)  Total Time spent with patient: 20 minutes  Past Psychiatric History: See admission H&P  Past Medical History:  Past Medical History:  Diagnosis Date  . Anxiety   . Seizures (HCC)    stress related; no more seizures since first one 10 years ago; on no meds.    Past Surgical History:  Procedure Laterality Date  . HEMORRHOID SURGERY N/A 11/12/2015   Procedure: EXTENSIVE HEMORRHOIDECTOMY;  Surgeon: Franky Macho, MD;  Location: [facility];  Service: General;  Laterality:  N/A;   Family History: History reviewed. No pertinent family history. Family Psychiatric  History: See admission H&P Social History:  Social History   Substance and Sexual Activity  Alcohol Use Yes   Comment: last drink 10/25/2019, pt said he had 3 beers     Social History   Substance and Sexual Activity  Drug Use Yes  . Types: Heroin, Methamphetamines, Other-see comments   Comment: was using synthetic heroin daily for 2 years but stopped 2 months ago, methamphetamine daily for 3 months until June (last use 10/25/2019- "just a pinch"), "mushrooms yesterday," and "ketamine today"    Social History   Socioeconomic History  . Marital status: Single    Spouse name: Not on file  . Number of children: Not on file  . Years of education: Not on file  . Highest education level: Not on file  Occupational History  . Not on file  Tobacco Use  . Smoking status: Current Every Day Smoker    Packs/day: 0.50    Years: 10.00    Pack years: 5.00    Types: Cigarettes  . Smokeless tobacco: Never Used  Vaping Use  . Vaping Use: Never assessed  Substance and Sexual Activity  . Alcohol use: Yes    Comment: last drink 10/25/2019, pt said he had 3 beers  . Drug use: Yes    Types: Heroin, Methamphetamines, Other-see comments    Comment: was using synthetic heroin daily for 2 years but stopped 2 months ago, methamphetamine daily for 3  months until June (last use 10/25/2019- "just a pinch"), "mushrooms yesterday," and "ketamine today"  . Sexual activity: Never    Birth control/protection: None  Other Topics Concern  . Not on file  Social History Narrative  . Not on file   Social Determinants of Health   Financial Resource Strain:   . Difficulty of Paying Living Expenses: Not on file  Food Insecurity:   . Worried About Programme researcher, broadcasting/film/video in the Last Year: Not on file  . Ran Out of Food in the Last Year: Not on file  Transportation Needs:   . Lack of Transportation (Medical): Not on file   . Lack of Transportation (Non-Medical): Not on file  Physical Activity:   . Days of Exercise per Week: Not on file  . Minutes of Exercise per Session: Not on file  Stress:   . Feeling of Stress : Not on file  Social Connections:   . Frequency of Communication with Friends and Family: Not on file  . Frequency of Social Gatherings with Friends and Family: Not on file  . Attends Religious Services: Not on file  . Active Member of Clubs or Organizations: Not on file  . Attends Banker Meetings: Not on file  . Marital Status: Not on file   Additional Social History:    Pain Medications: Please see MAR Prescriptions: Please see MAR Over the Counter: Please see MAR History of alcohol / drug use?: Yes Longest period of sobriety (when/how long): Unknown Name of Substance 1: Amphetamine 1 - Age of First Use: Unknown 1 - Amount (size/oz): Pt does not know 1 - Frequency: Daily for 3 months approx March - June 2021 1 - Duration: 3 months 1 - Last Use / Amount: Today; pt states he used a little amount due to pain Name of Substance 2: Heroin ("synthetic heroin") 2 - Age of First Use: Unknown 2 - Amount (size/oz): 1 gram + 2 - Frequency: Daily for two years; stopped 2 1/2 months ago 2 - Duration: 2 years 2 - Last Use / Amount: 2 1/2 months ago Name of Substance 3: EtOH 3 - Age of First Use: Unknown 3 - Amount (size/oz): 2 24-ounce beers 3 - Frequency: None 3 - Duration: None 3 - Last Use / Amount: Today; drank 2 24-ounce beers; son states it was to assist in getting him to agree to come to hospital              Sleep: Fair  Appetite:  Good  Current Medications: Current Facility-Administered Medications  Medication Dose Route Frequency Provider Last Rate Last Admin  . acetaminophen (TYLENOL) tablet 650 mg  650 mg Oral Q6H PRN Nira Conn A, NP   650 mg at 10/30/19 1106  . alum & mag hydroxide-simeth (MAALOX/MYLANTA) 200-200-20 MG/5ML suspension 30 mL  30 mL Oral  Q4H PRN Nira Conn A, NP      . benztropine (COGENTIN) tablet 0.5 mg  0.5 mg Oral BID PRN Antonieta Pert, MD   0.5 mg at 10/30/19 0914  . dicyclomine (BENTYL) tablet 20 mg  20 mg Oral TID AC & HS Doda, Vandana, MD   20 mg at 10/30/19 1106  . famotidine (PEPCID) tablet 20 mg  20 mg Oral BID AC Karsten Ro, MD   20 mg at 10/30/19 0914  . feeding supplement (ENSURE ENLIVE) (ENSURE ENLIVE) liquid 237 mL  237 mL Oral BID BM Antonieta Pert, MD   237 mL at 10/30/19 0918  .  folic acid (FOLVITE) tablet 1 mg  1 mg Oral Daily Antonieta Pert, MD   1 mg at 10/30/19 0915  . gabapentin (NEURONTIN) capsule 300 mg  300 mg Oral TID Antonieta Pert, MD   300 mg at 10/30/19 1142  . haloperidol (HALDOL) tablet 5 mg  5 mg Oral Q6H PRN Nira Conn A, NP   5 mg at 10/29/19 2106   Or  . haloperidol lactate (HALDOL) injection 5 mg  5 mg Intramuscular Q6H PRN Nira Conn A, NP   5 mg at 10/28/19 0046  . hydrOXYzine (ATARAX/VISTARIL) tablet 25 mg  25 mg Oral TID PRN Jackelyn Poling, NP   25 mg at 10/28/19 2134  . LORazepam (ATIVAN) tablet 1 mg  1 mg Oral PRN Antonieta Pert, MD      . risperiDONE (RISPERDAL M-TABS) disintegrating tablet 2 mg  2 mg Oral Q8H PRN Antonieta Pert, MD   2 mg at 10/29/19 2244   And  . LORazepam (ATIVAN) tablet 1 mg  1 mg Oral PRN Antonieta Pert, MD       And  . ziprasidone (GEODON) injection 20 mg  20 mg Intramuscular PRN Antonieta Pert, MD      . magnesium hydroxide (MILK OF MAGNESIA) suspension 30 mL  30 mL Oral Daily PRN Nira Conn A, NP   30 mL at 10/29/19 1759  . methocarbamol (ROBAXIN) tablet 500 mg  500 mg Oral Q8H PRN Antonieta Pert, MD   500 mg at 10/29/19 1800  . multivitamin with minerals tablet 1 tablet  1 tablet Oral Daily Antonieta Pert, MD   1 tablet at 10/30/19 0915  . naproxen (NAPROSYN) tablet 500 mg  500 mg Oral BID PRN Antonieta Pert, MD   500 mg at 10/29/19 2107  . nicotine (NICODERM CQ - dosed in mg/24 hours) patch 21 mg  21  mg Transdermal Daily Antonieta Pert, MD   21 mg at 10/30/19 0915  . QUEtiapine (SEROQUEL) tablet 100 mg  100 mg Oral Daily Antonieta Pert, MD   100 mg at 10/30/19 0914  . QUEtiapine (SEROQUEL) tablet 600 mg  600 mg Oral QHS Karsten Ro, MD   600 mg at 10/29/19 2106  . thiamine tablet 100 mg  100 mg Oral Daily Antonieta Pert, MD   100 mg at 10/30/19 0915  . traZODone (DESYREL) tablet 50 mg  50 mg Oral QHS PRN Nira Conn A, NP   50 mg at 10/29/19 2243    Lab Results: No results found for this or any previous visit (from the past 48 hour(s)).  Blood Alcohol level:  Lab Results  Component Value Date   ETH <10 10/27/2019   ETH  06/14/2007    <5        LOWEST DETECTABLE LIMIT FOR SERUM ALCOHOL IS 11 mg/dL FOR MEDICAL PURPOSES ONLY    Metabolic Disorder Labs: Lab Results  Component Value Date   HGBA1C 5.2 10/27/2019   MPG 102.54 10/27/2019   Lab Results  Component Value Date   PROLACTIN 30.2 (H) 10/27/2019   No results found for: CHOL, TRIG, HDL, CHOLHDL, VLDL, LDLCALC  Physical Findings: AIMS: Facial and Oral Movements Muscles of Facial Expression: None, normal Lips and Perioral Area: None, normal Jaw: None, normal Tongue: None, normal,Extremity Movements Upper (arms, wrists, hands, fingers): Mild Lower (legs, knees, ankles, toes): None, normal, Trunk Movements Neck, shoulders, hips: None, normal, Overall Severity Severity of abnormal movements (highest score  from questions above): Mild Incapacitation due to abnormal movements: Minimal Patient's awareness of abnormal movements (rate only patient's report): Aware, mild distress, Dental Status Current problems with teeth and/or dentures?: No Does patient usually wear dentures?: No  CIWA:  CIWA-Ar Total: 7 COWS:  COWS Total Score: 7  Musculoskeletal: Strength & Muscle Tone: within normal limits Gait & Station: normal Patient leans: N/A  Psychiatric Specialty Exam: Physical Exam Vitals and nursing note  reviewed.  Constitutional:      Appearance: Normal appearance.  HENT:     Head: Normocephalic and atraumatic.  Pulmonary:     Effort: Pulmonary effort is normal.  Neurological:     General: No focal deficit present.     Mental Status: He is alert and oriented to person, place, and time.     Review of Systems  Blood pressure 120/79, pulse (!) 134, temperature 98.3 F (36.8 C), temperature source Oral, resp. rate 16, height 5' 3.5" (1.613 m), weight 64.2 kg, SpO2 97 %.Body mass index is 24.67 kg/m.  General Appearance: Casual  Eye Contact:  Fair  Speech:  Normal Rate  Volume:  Normal  Mood:  Euthymic  Affect:  Congruent  Thought Process:  Coherent and Descriptions of Associations: Loose  Orientation:  Full (Time, Place, and Person)  Thought Content:  Delusions  Suicidal Thoughts:  No  Homicidal Thoughts:  No  Memory:  Immediate;   Fair Recent;   Fair Remote;   Fair  Judgement:  Intact  Insight:  Fair  Psychomotor Activity:  Normal  Concentration:  Concentration: Good and Attention Span: Good  Recall:  Good  Fund of Knowledge:  Good  Language:  Good  Akathisia:  Negative  Handed:  Right  AIMS (if indicated):     Assets:  Desire for Improvement Housing Resilience Social Support  ADL's:  Intact  Cognition:  WNL  Sleep:  Number of Hours: 5.5     Treatment Plan Summary: Daily contact with patient to assess and evaluate symptoms and progress in treatment, Medication management and Plan : Patient is seen and examined.  Patient is a 39 year old male with the above-stated past psychiatric history who is seen in follow-up.   Diagnosis: 1.  Substance-induced psychotic disorder versus bipolar disorder with psychotic features (with contribution of substances) versus schizoaffective disorder; bipolar type (with substances involved as well). 2.  Polysubstance use disorder versus polysubstance dependence 3.  Tachycardia 4.  Incomplete right bundle branch block  Pertinent  findings on examination today: 1.  Still with some residual visual hallucinations. 2.  I have concerned that his thinking about his "wife" is delusional as well.  We will have social work contact his family to get collateral information on that. 3.  Sleep is slightly improved with current medications. 4.  Leg pain seems to be improved with gabapentin. 5.  Patient does not discuss "nanocytes" today.  Plan: 1.  Continue Cogentin 0.5 mg p.o. twice daily as needed tremors. 2.  Stop clinical opiate withdrawal score. 3.  Stop Bentyl. 4.  Repeat EKG secondary to abnormal EKG and tachycardia. 5.  Continue famotidine 20 mg p.o. twice daily for reflux disease 6.  Continue folic acid 1 mg p.o. daily for nutritional supplementation. 7.  Continue gabapentin 300 mg p.o. 3 times daily for chronic leg pain. 8.  Stop Robaxin. 9.  Continue multivitamin p.o. daily for nutritional supplementation. 10.  Continue Seroquel 100 mg p.o. daily but increase nightly dose to 800 mg for psychosis and sleep. 11.  Continue seizure  precautions. 12.  Continue thiamine 100 mg p.o. daily for nutritional supplementation. 13.  Continue trazodone but increase to 100 mg p.o. nightly as needed insomnia. 14.  Disposition planning-in progress.  Antonieta Pert, MD 10/30/2019, 1:15 PM

## 2019-10-30 NOTE — Progress Notes (Signed)
Patient ID: Ronald Hoffman, male   DOB: Jul 03, 1980, 39 y.o.   MRN: [id number] Patient agitated at the start of shift, intrusive at the nurses' station asking for medications. Patient given his scheduled meds at 2100, meds included Seroquel 600mg , and a PRN Haldol 5mg  and Cogentin 2mg .  Patient was educated and encouraged to lie down and try to get some rest, which he did for 30 minutes and came back to the nurses station and yelling at this RN, stating: "You'all are not fucking helping me. I am here to get help". Patient was slurring, looked very sleepy, but yelling out at staff, and agitated, still stating that he was in so much discomfort.  Patient was educated on not using profanity, and was again medicated with Trazodone 50mg  and Risperdone 2mg  for insomnia and agitation respectively. Patient  Was able sleep through the night with respirations even and unlabored. Will report to oncoming shift, Q15 minute checks maintained.

## 2019-10-30 NOTE — [facility] Group Notes (Signed)
Psychoeducational Group Note  Date:  10-30-19 Time:  1300  Group Topic/Focus:  Making Healthy Choices:   The focus of this group is to help patients identify negative/unhealthy choices they were using prior to admission and identify positive/healthier coping strategies to replace them upon discharge.  Participation Level:  Active  Participation Quality:  Appropriate  Affect:  Appropriate  Cognitive:  Oriented  Insight:  Improving  Engagement in Group:  Engaged  Additional Comments:  Pt was able to write out positives about himself and was proud of what he had to say. His peers gave him a lot of positive feedback  Dione Housekeeper

## 2019-10-31 MED ORDER — NICOTINE 21 MG/24HR TD PT24
21.0000 mg | MEDICATED_PATCH | Freq: Every day | TRANSDERMAL | 0 refills | Status: DC
Start: 2019-10-31 — End: 2021-02-25

## 2019-10-31 MED ORDER — GABAPENTIN 400 MG PO CAPS
400.0000 mg | ORAL_CAPSULE | Freq: Three times a day (TID) | ORAL | 0 refills | Status: DC
Start: 2019-10-31 — End: 2021-02-25

## 2019-10-31 MED ORDER — GABAPENTIN 300 MG PO CAPS
300.0000 mg | ORAL_CAPSULE | Freq: Once | ORAL | Status: AC
  Administered 2019-10-31: 300 mg via ORAL
  Filled 2019-10-31 (×2): qty 1

## 2019-10-31 MED ORDER — HYDROXYZINE HCL 25 MG PO TABS
25.0000 mg | ORAL_TABLET | Freq: Three times a day (TID) | ORAL | 0 refills | Status: DC | PRN
Start: 2019-10-31 — End: 2021-02-25

## 2019-10-31 MED ORDER — QUETIAPINE FUMARATE 100 MG PO TABS: 100.0000 mg | ORAL_TABLET | Freq: Every day | ORAL | 0 refills | Status: DC

## 2019-10-31 MED ORDER — GABAPENTIN 400 MG PO CAPS
400.0000 mg | ORAL_CAPSULE | Freq: Three times a day (TID) | ORAL | Status: DC
  Filled 2019-10-31 (×3): qty 1

## 2019-10-31 MED ORDER — FAMOTIDINE 20 MG PO TABS
20.0000 mg | ORAL_TABLET | Freq: Two times a day (BID) | ORAL | 0 refills | Status: DC
Start: 2019-10-31 — End: 2021-02-25

## 2019-10-31 MED ORDER — QUETIAPINE FUMARATE 400 MG PO TABS: 800.0000 mg | ORAL_TABLET | Freq: Every day | ORAL | 0 refills | Status: DC

## 2019-10-31 MED ORDER — TRAZODONE HCL 50 MG PO TABS
50.0000 mg | ORAL_TABLET | Freq: Every evening | ORAL | 0 refills | Status: DC | PRN
Start: 2019-10-31 — End: 2021-02-25

## 2019-10-31 NOTE — Progress Notes (Signed)
  [facility] Adult Case Management Discharge Plan :  Will you be returning to the same living situation after discharge:  Yes,  to home At discharge, do you have transportation home?: Yes,  parents to pick this patient up Do you have the ability to pay for your medications: Yes,  has insurance  Release of information consent forms completed and in the chart;  Patient's signature needed at discharge.  Patient to Follow up at:  Follow-up Information    Services, [facility]. Go on 10/31/2019.   Why: You have a hospital follow-up appointment for medication management and therapy services on 11/01/19 at 9:00 am.  This appointment will be held in person.   Contact information: [address] [place] [address] [phone]               Next level of care provider has access to [facility] Link:no  Safety Planning and Suicide Prevention discussed: Yes,  with family     Has patient been referred to the Quitline?: Yes, faxed on 10/31/19  Patient has been referred for addiction treatment: Pt. refused referral  Otelia Santee, LCSW 10/31/2019, 10:33 AM

## 2019-10-31 NOTE — Progress Notes (Signed)
Pt discharged to lobby. Pt was stable and appreciative at that time. All papers and prescriptions were given and valuables returned. Verbal understanding expressed. Denies SI/HI and A/VH. Pt given opportunity to express concerns and ask questions.  

## 2019-10-31 NOTE — Progress Notes (Signed)
   10/30/19 2300  COVID-19 Daily Checkoff  Have you had a fever (temp > 37.80C/100F)  in the past 24 hours?  No  If you have had runny nose, nasal congestion, sneezing in the past 24 hours, has it worsened? No  COVID-19 EXPOSURE  Have you traveled outside the state in the past 14 days? No  Have you been in contact with someone with a confirmed diagnosis of COVID-19 or PUI in the past 14 days without wearing appropriate PPE? No  Have you been living in the same home as a person with confirmed diagnosis of COVID-19 or a PUI (household contact)? No  Have you been diagnosed with COVID-19? No

## 2019-10-31 NOTE — Progress Notes (Signed)
Patient in bed asleep since shift change. No distress noted.

## 2019-10-31 NOTE — Progress Notes (Signed)
Patient came to nursing station to request his medications ordered. He has been asleep since shift change. Snacks given and patient returned to his room.

## 2019-10-31 NOTE — Progress Notes (Signed)
Recreation Therapy Notes  Date: 9.20.21 Time: 1015 Location: [address] Dayroom  Group Topic: Triggers  Goal Area(s) Addresses:  Patient will identify their biggest triggers. Patient will identify ways to avoid dealing with triggers. Patient will identify ways to face triggers head on.  Behavioral Response: Engaged  Intervention:  Worksheet, pencils   Activity: Triggers.  Patients were to describe their three biggest triggers.  Patients were to then identify ways to avoid/reduce exposure to triggers.  Patients would then identify ways in which they can face triggers head on when they are unavoidable.   Education: Triggers, Discharge Planning  Education Outcome: Acknowledges understanding/In group clarification offered/Needs additional education.   Clinical Observations/Feedback: Pt stated triggers were work, equal opportunity and work together.  Pt he deals with triggers by avoiding them or make friends.  Pt expressed facing triggers head on by calling [employer] or [employer].     Caroll Rancher, LRT/CTRS    Caroll Rancher A 10/31/2019 11:24 AM

## 2019-10-31 NOTE — [facility] Group Notes (Signed)
The focus of this group is to help patients establish daily goals to achieve during treatment and discuss how the patient can incorporate goal setting into their daily lives to aide in recovery.  Pt stated that he wants to "cut the BS", when asked what BS pt stated drugs. He says that he wants to stop doing drugs.

## 2019-10-31 NOTE — BHH Suicide Risk Assessment (Signed)
[facility] Discharge Suicide Risk Assessment   Principal Problem: Acute psychosis [facility]) Discharge Diagnoses: Principal Problem:   Acute psychosis (HCC) Active Problems:   Delusional disorder (HCC)   Total Time spent with patient: 20 minutes  Musculoskeletal: Strength & Muscle Tone: within normal limits Gait & Station: normal Patient leans: N/A  Psychiatric Specialty Exam: Review of Systems  Psychiatric/Behavioral: Positive for sleep disturbance.  All other systems reviewed and are negative.   Blood pressure 104/64, pulse (!) 161, temperature (!) 97.5 F (36.4 C), temperature source Oral, resp. rate 16, height 5' 3.5" (1.613 m), weight 64.2 kg, SpO2 97 %.Body mass index is 24.67 kg/m.  General Appearance: Casual  Eye Contact::  Fair  Speech:  Normal Rate409  Volume:  Normal  Mood:  Euthymic  Affect:  Congruent  Thought Process:  Coherent and Descriptions of Associations: Loose  Orientation:  Full (Time, Place, and Person)  Thought Content:  Delusions and Paranoid Ideation  Suicidal Thoughts:  No  Homicidal Thoughts:  No  Memory:  Immediate;   Fair Recent;   Fair Remote;   Fair  Judgement:  Intact  Insight:  Lacking  Psychomotor Activity:  Normal  Concentration:  Fair  Recall:  [employer] of Knowledge:Fair  Language: Good  Akathisia:  Negative  Handed:  Right  AIMS (if indicated):     Assets:  Desire for Improvement Housing Resilience Social Support  Sleep:  Number of Hours: 3.75  Cognition: WNL  ADL's:  Intact   Mental Status Per Nursing Assessment::   On Admission:  Plan to harm others  Demographic Factors:  Male, Caucasian, Low socioeconomic status and Unemployed  Loss Factors: Legal issues  Historical Factors: Impulsivity  Risk Reduction Factors:   Living with another person, especially a relative and Positive social support  Continued Clinical Symptoms:  Alcohol/Substance Abuse/Dependencies Schizophrenia:   Less than 5 years old Paranoid or  undifferentiated type  Cognitive Features That Contribute To Risk:  Thought constriction (tunnel vision)    Suicide Risk:  Minimal: No identifiable suicidal ideation.  Patients presenting with no risk factors but with morbid ruminations; may be classified as minimal risk based on the severity of the depressive symptoms    Plan Of Care/Follow-up recommendations:  Activity:  ad lib  Antonieta Pert, MD 10/31/2019, 8:24 AM
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
## 2019-11-14 DIAGNOSIS — F1120 Opioid dependence, uncomplicated: Secondary | ICD-10-CM | POA: Diagnosis not present

## 2020-02-01 ENCOUNTER — Ambulatory Visit (HOSPITAL_COMMUNITY)
Admission: RE | Admit: 2020-02-01 | Discharge: 2020-02-01 | Disposition: A | Discharge status: Home / Self Care | Payer: Self-pay | Attending: Psychiatry | Admitting: Psychiatry

## 2020-02-01 NOTE — Progress Notes (Signed)
Went to lobby to call patient back for assessment. Informed by staff he had left without being seen.

## 2020-03-18 ENCOUNTER — Emergency Department (HOSPITAL_COMMUNITY)
Admission: EM | Admit: 2020-03-18 | Discharge: 2020-03-23 | Disposition: A | Discharge status: Home / Self Care | Payer: Self-pay | Attending: Emergency Medicine | Admitting: Emergency Medicine

## 2020-03-18 ENCOUNTER — Encounter (HOSPITAL_COMMUNITY): Payer: Self-pay | Admitting: *Deleted

## 2020-03-18 ENCOUNTER — Other Ambulatory Visit: Payer: Self-pay

## 2020-03-18 DIAGNOSIS — F1110 Opioid abuse, uncomplicated: Secondary | ICD-10-CM | POA: Clinically undetermined

## 2020-03-18 DIAGNOSIS — F23 Brief psychotic disorder: Secondary | ICD-10-CM | POA: Diagnosis present

## 2020-03-18 DIAGNOSIS — F19251 Other psychoactive substance dependence with psychoactive substance-induced psychotic disorder with hallucinations: Secondary | ICD-10-CM | POA: Insufficient documentation

## 2020-03-18 DIAGNOSIS — F17210 Nicotine dependence, cigarettes, uncomplicated: Secondary | ICD-10-CM | POA: Insufficient documentation

## 2020-03-18 DIAGNOSIS — F1910 Other psychoactive substance abuse, uncomplicated: Secondary | ICD-10-CM | POA: Clinically undetermined

## 2020-03-18 DIAGNOSIS — F22 Delusional disorders: Secondary | ICD-10-CM | POA: Diagnosis present

## 2020-03-18 DIAGNOSIS — F29 Unspecified psychosis not due to a substance or known physiological condition: Secondary | ICD-10-CM

## 2020-03-18 DIAGNOSIS — Z20822 Contact with and (suspected) exposure to covid-19: Secondary | ICD-10-CM | POA: Insufficient documentation

## 2020-03-18 DIAGNOSIS — F19959 Other psychoactive substance use, unspecified with psychoactive substance-induced psychotic disorder, unspecified: Secondary | ICD-10-CM | POA: Clinically undetermined

## 2020-03-18 NOTE — BH Assessment (Signed)
Clinician attempted to call the pt father/IVC petitioner Virginia Francisco Sr., [phone]) to gather additional information but received the following message: "I'm sorry but the person you called has a voicemail that is not set up, goodbye."    Redmond Pulling, MS, [facility], [facility] Triage Specialist [phone]

## 2020-03-18 NOTE — ED Provider Notes (Signed)
[facility] EMERGENCY DEPARTMENT Provider Note   CSN: [id number] Arrival date & time: 03/18/20  2202     History Chief Complaint  Patient presents with  . V70.1    Ronald Hoffman is a 40 y.o. male.  Patient with hx mental health illness, presents with ivc/law enforcement. Parent had filled out ivc noting that pt has not been taking his meds, and that he has periods of agitated behavior, was throwing objects at home.  Patient indicates he feels fine, and is not sure why he was brought here. He states he sometimes does not take his meds. Denies feeling anxious or depressed. Denies any thoughts of harm to self or others. Normal appetite. Sleeps ok at night. Denies recent acute physical or mental health symptoms. Pt poor historian, level 5 caveat.   The history is provided by the patient, the police and a parent.       Past Medical History:  Diagnosis Date  . Anxiety   . Seizures (HCC)    stress related; no more seizures since first one 10 years ago; on no meds.    Patient Active Problem List   Diagnosis Date Noted  . Acute psychosis (HCC) 10/26/2019  . Delusional disorder (HCC) 10/25/2019    Past Surgical History:  Procedure Laterality Date  . HEMORRHOID SURGERY N/A 11/12/2015   Procedure: EXTENSIVE HEMORRHOIDECTOMY;  Surgeon: Franky Macho, MD;  Location: [facility];  Service: General;  Laterality: N/A;       History reviewed. No pertinent family history.  Social History   Tobacco Use  . Smoking status: Current Every Day Smoker    Packs/day: 0.50    Years: 10.00    Pack years: 5.00    Types: Cigarettes  . Smokeless tobacco: Never Used  Substance Use Topics  . Alcohol use: Yes    Comment: last drink 10/25/2019, pt said he had 3 beers  . Drug use: Yes    Types: Heroin, Methamphetamines, Other-see comments    Comment: was using synthetic heroin daily for 2 years but stopped 2 months ago, methamphetamine daily for 3 months until June (last use 10/25/2019- "just a  pinch"), "mushrooms yesterday," and "ketamine today"    Home Medications Prior to Admission medications   Medication Sig Start Date End Date Taking? Authorizing Provider  famotidine (PEPCID) 20 MG tablet Take 1 tablet (20 mg total) by mouth 2 (two) times daily before a meal. 10/31/19   Aldean Baker, NP  gabapentin (NEURONTIN) 400 MG capsule Take 1 capsule (400 mg total) by mouth 3 (three) times daily. 10/31/19   Aldean Baker, NP  hydrOXYzine (ATARAX/VISTARIL) 25 MG tablet Take 1 tablet (25 mg total) by mouth 3 (three) times daily as needed for anxiety. 10/31/19   Aldean Baker, NP  nicotine (NICODERM CQ - DOSED IN MG/24 HOURS) 21 mg/24hr patch Place 1 patch (21 mg total) onto the skin daily. 10/31/19   Aldean Baker, NP  QUEtiapine (SEROQUEL) 100 MG tablet Take 1 tablet (100 mg total) by mouth daily. 10/31/19   Aldean Baker, NP  QUEtiapine (SEROQUEL) 400 MG tablet Take 2 tablets (800 mg total) by mouth at bedtime. 10/31/19   Aldean Baker, NP  traZODone (DESYREL) 50 MG tablet Take 1 tablet (50 mg total) by mouth at bedtime as needed for sleep. 10/31/19   Aldean Baker, NP    Allergies    Hydrocodone and Penicillins  Review of Systems   Review of Systems  Constitutional: Negative for  fever.  HENT: Negative for sore throat.   Eyes: Negative for redness.  Respiratory: Negative for cough and shortness of breath.   Cardiovascular: Negative for chest pain.  Gastrointestinal: Negative for abdominal pain.  Genitourinary: Negative for flank pain.  Musculoskeletal: Negative for back pain and neck pain.  Skin: Negative for rash.  Neurological: Negative for headaches.  Hematological: Does not bruise/bleed easily.  Psychiatric/Behavioral: Negative for suicidal ideas.    Physical Exam Updated Vital Signs BP (!) 114/99 (BP Location: Right Arm)   Pulse (!) 104   Temp 98.2 F (36.8 C) (Oral)   Resp 14   Ht 1.651 m (5\' 5" )   Wt 74.8 kg   SpO2 100%   BMI 27.46 kg/m   Physical  Exam Vitals and nursing note reviewed.  Constitutional:      Appearance: Normal appearance. He is well-developed.  HENT:     Head: Atraumatic.     Nose: Nose normal.     Mouth/Throat:     Mouth: Mucous membranes are moist.  Eyes:     General: No scleral icterus.    Conjunctiva/sclera: Conjunctivae normal.     Pupils: Pupils are equal, round, and reactive to light.  Neck:     Trachea: No tracheal deviation.  Cardiovascular:     Rate and Rhythm: Normal rate and regular rhythm.     Pulses: Normal pulses.     Heart sounds: Normal heart sounds. No murmur heard. No friction rub. No gallop.   Pulmonary:     Effort: Pulmonary effort is normal. No accessory muscle usage or respiratory distress.     Breath sounds: Normal breath sounds.  Abdominal:     General: Bowel sounds are normal. There is no distension.     Palpations: Abdomen is soft.     Tenderness: There is no abdominal tenderness.  Genitourinary:    Comments: No cva tenderness. Musculoskeletal:        General: No swelling.     Cervical back: Normal range of motion and neck supple. No rigidity.  Skin:    General: Skin is warm and dry.     Findings: No rash.  Neurological:     Mental Status: He is alert.     Comments: Alert, speech clear. Steady gait.   Psychiatric:     Comments: Normal mood/affect. Pt with very little insight into concerns of parent and reasons for being brought into ED. He seems to minimize any recent issues, saying he feels fine, and feels his behavior has been fine. No SI/HI.      ED Results / Procedures / Treatments   Labs (all labs ordered are listed, but only abnormal results are displayed) Results for orders placed or performed during the hospital encounter of 10/25/19  SARS Coronavirus 2 by RT PCR (hospital order, performed in [facility] hospital lab) Nasopharyngeal Nasopharyngeal Swab   Specimen: Nasopharyngeal Swab  Result Value Ref Range   SARS Coronavirus 2 NEGATIVE NEGATIVE  CBC  Result  Value Ref Range   WBC 10.7 (H) 4.0 - 10.5 K/uL   RBC 5.35 4.22 - 5.81 MIL/uL   Hemoglobin 15.3 13.0 - 17.0 g/dL   HCT [facility] 84.6 - 96.2 %   MCV 82.1 80.0 - 100.0 fL   MCH 28.6 26.0 - 34.0 pg   MCHC 34.9 30.0 - 36.0 g/dL   RDW 95.2 84.1 - 32.4 %   Platelets 363 150 - 400 K/uL   nRBC 0.0 0.0 - 0.2 %  Comprehensive metabolic panel  Result Value Ref Range   Sodium 146 (H) 135 - 145 mmol/L   Potassium 3.5 3.5 - 5.1 mmol/L   Chloride 107 98 - 111 mmol/L   CO2 24 22 - 32 mmol/L   Glucose, Bld 92 70 - 99 mg/dL   BUN 15 6 - 20 mg/dL   Creatinine, Ser 9.93 0.61 - 1.24 mg/dL   Calcium 71.6 8.9 - 96.7 mg/dL   Total Protein 8.3 (H) 6.5 - 8.1 g/dL   Albumin 4.8 3.5 - 5.0 g/dL   AST 27 15 - 41 U/L   ALT 27 0 - 44 U/L   Alkaline Phosphatase 118 38 - 126 U/L   Total Bilirubin 0.8 0.3 - 1.2 mg/dL   GFR calc non Af Amer >60 >60 mL/min   GFR calc Af Amer >60 >60 mL/min   Anion gap 15 5 - 15  Ethanol  Result Value Ref Range   Alcohol, Ethyl (B) <10 <10 mg/dL  Hemoglobin E9F  Result Value Ref Range   Hgb A1c MFr Bld 5.2 4.8 - 5.6 %   Mean Plasma Glucose 102.54 mg/dL  Prolactin  Result Value Ref Range   Prolactin 30.2 (H) 4.0 - 15.2 ng/mL  TSH  Result Value Ref Range   TSH 0.247 (L) 0.350 - 4.500 uIU/mL  Rapid urine drug screen (hospital performed)  Result Value Ref Range   Opiates POSITIVE (A) NONE DETECTED   Cocaine NONE DETECTED NONE DETECTED   Benzodiazepines POSITIVE (A) NONE DETECTED   Amphetamines NONE DETECTED NONE DETECTED   Tetrahydrocannabinol NONE DETECTED NONE DETECTED   Barbiturates NONE DETECTED NONE DETECTED  T4, free  Result Value Ref Range   Free T4 1.07 0.61 - 1.12 ng/dL  T3, free  Result Value Ref Range   T3, Free 2.5 2.0 - 4.4 pg/mL  TSH  Result Value Ref Range   TSH 0.360 0.350 - 4.500 uIU/mL    EKG None  Radiology No results found.  Procedures Procedures   Medications Ordered in ED Medications - No data to display  ED Course  I have reviewed  the triage vital signs and the nursing notes.  Pertinent labs & imaging results that were available during my care of the patient were reviewed by me and considered in my medical decision making (see chart for details).    MDM Rules/Calculators/A&P                         Labs sent.    Reviewed nursing notes and prior charts for additional history.  IVC reviewed.   BH team consulted.   Labs pending.   Recheck pt, calm and alert.   BH evaluation pending - disposition per [facility] team - as pt seems to minimize issues, likely will need to obtain collateral information from parents/family.   The patient has been placed in psychiatric observation due to the need to provide a safe environment for the patient while obtaining psychiatric consultation and evaluation, as well as ongoing medical and medication management to treat the patient's condition.  The patient has been placed under full IVC at this time.  Signed out to oncoming EDP at 2330.     Final Clinical Impression(s) / ED Diagnoses Final diagnoses:  None    Rx / DC Orders ED Discharge Orders    None       Cathren Laine, MD 03/18/20 2332

## 2020-03-18 NOTE — ED Notes (Signed)
Security wanded pt in triage.  

## 2020-03-18 NOTE — ED Notes (Signed)
Pt changed into maroon scrubs, personal belongings secured in two separate bags in locker. Pt wanded by security. Pt unable to provide urine sample at this time.
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
## 2020-03-18 NOTE — BH Assessment (Addendum)
Areta Haber, NT to fax pr's IVC paperwork. Once IVC paperwork is received clinician to complete pt's assessment.    Redmond Pulling, MS, [facility], [facility] Triage Specialist [phone]

## 2020-03-18 NOTE — ED Triage Notes (Signed)
Pt brought here by [employer] with IVC papers.  dx'd with schizophrenia and not taking medication correctly per IVC papers. Pt denies SI or Hi.  Pt denies any ETOH or drug use.

## 2020-03-18 NOTE — ED Notes (Signed)
Pt being uncooperative with ED Staff and Lab Personelle at this time. Pt refusing to allow blood work to be obtained. [employer] at bedside.

## 2020-03-19 DIAGNOSIS — F19959 Other psychoactive substance use, unspecified with psychoactive substance-induced psychotic disorder, unspecified: Secondary | ICD-10-CM | POA: Clinically undetermined

## 2020-03-19 DIAGNOSIS — F1910 Other psychoactive substance abuse, uncomplicated: Secondary | ICD-10-CM | POA: Clinically undetermined

## 2020-03-19 DIAGNOSIS — F1110 Opioid abuse, uncomplicated: Secondary | ICD-10-CM | POA: Clinically undetermined

## 2020-03-19 HISTORY — DX: Other psychoactive substance abuse, uncomplicated: F19.10

## 2020-03-19 HISTORY — DX: Other psychoactive substance use, unspecified with psychoactive substance-induced psychotic disorder, unspecified: F19.959

## 2020-03-19 LAB — BASIC METABOLIC PANEL
Anion gap: 4 — ABNORMAL LOW (ref 5–15)
BUN: 10 mg/dL (ref 6–20)
CO2: 27 mmol/L (ref 22–32)
Calcium: 9 mg/dL (ref 8.9–10.3)
Chloride: 106 mmol/L (ref 98–111)
Creatinine, Ser: 0.73 mg/dL (ref 0.61–1.24)
GFR, Estimated: >60 mL/min (ref >60)
Glucose, Bld: 102 mg/dL — ABNORMAL HIGH (ref 70–99)
Potassium: 4.2 mmol/L (ref 3.5–5.1)
Sodium: 137 mmol/L (ref 135–145)

## 2020-03-19 LAB — CBC WITH DIFFERENTIAL/PLATELET
Abs Immature Granulocytes: 0.02 10*3/uL (ref 0.00–0.07)
Basophils Absolute: 0 10*3/uL (ref 0.0–0.1)
Basophils Relative: 0 %
Eosinophils Absolute: 0.9 10*3/uL — ABNORMAL HIGH (ref 0.0–0.5)
Eosinophils Relative: 13 %
HCT: 39.8 % (ref 39.0–52.0)
Hemoglobin: 13.3 g/dL (ref 13.0–17.0)
Immature Granulocytes: 0 %
Lymphocytes Relative: 24 %
Lymphs Abs: 1.6 10*3/uL (ref 0.7–4.0)
MCH: 27.9 pg (ref 26.0–34.0)
MCHC: 33.4 g/dL (ref 30.0–36.0)
MCV: 83.4 fL (ref 80.0–100.0)
Monocytes Absolute: 0.4 10*3/uL (ref 0.1–1.0)
Monocytes Relative: 6 %
Neutro Abs: 3.9 10*3/uL (ref 1.7–7.7)
Neutrophils Relative %: 57 %
Platelets: 248 10*3/uL (ref 150–400)
RBC: 4.77 MIL/uL (ref 4.22–5.81)
RDW: 12.6 % (ref 11.5–15.5)
WBC: 6.9 10*3/uL (ref 4.0–10.5)
nRBC: 0 % (ref 0.0–0.2)

## 2020-03-19 LAB — ACETAMINOPHEN LEVEL: Acetaminophen (Tylenol), Serum: <10 ug/mL — ABNORMAL LOW (ref 10–30)

## 2020-03-19 LAB — RESP PANEL BY RT-PCR (FLU A&B, COVID) ARPGX2
Influenza A by PCR: NEGATIVE
Influenza B by PCR: NEGATIVE
SARS Coronavirus 2 by RT PCR: NEGATIVE

## 2020-03-19 LAB — RAPID URINE DRUG SCREEN, HOSP PERFORMED
Amphetamines: NOT DETECTED
Barbiturates: NOT DETECTED
Benzodiazepines: NOT DETECTED
Cocaine: NOT DETECTED
Opiates: NOT DETECTED
Tetrahydrocannabinol: NOT DETECTED

## 2020-03-19 LAB — ETHANOL: Alcohol, Ethyl (B): <10 mg/dL (ref <10)

## 2020-03-19 MED ORDER — STERILE WATER FOR INJECTION IJ SOLN
INTRAMUSCULAR | Status: AC
  Administered 2020-03-19: 1.2 mL
  Filled 2020-03-19: qty 10

## 2020-03-19 MED ORDER — ZIPRASIDONE MESYLATE 20 MG IM SOLR
INTRAMUSCULAR | Status: AC
  Administered 2020-03-19: 20 mg via INTRAMUSCULAR
  Filled 2020-03-19: qty 20

## 2020-03-19 MED ORDER — QUETIAPINE FUMARATE 100 MG PO TABS
100.0000 mg | ORAL_TABLET | Freq: Every day | ORAL | Status: DC
  Administered 2020-03-19 – 2020-03-23 (×5): 100 mg via ORAL
  Filled 2020-03-19 (×5): qty 1

## 2020-03-19 MED ORDER — TRAZODONE HCL 50 MG PO TABS: 50.0000 mg | ORAL_TABLET | Freq: Every evening | ORAL | Status: DC | PRN

## 2020-03-19 MED ORDER — QUETIAPINE FUMARATE 100 MG PO TABS
400.0000 mg | ORAL_TABLET | Freq: Every day | ORAL | Status: DC
Start: 2020-03-19 — End: 2020-03-23
  Administered 2020-03-19 – 2020-03-22 (×3): 400 mg via ORAL
  Filled 2020-03-19 (×3): qty 4

## 2020-03-19 MED ORDER — ZIPRASIDONE MESYLATE 20 MG IM SOLR
20.0000 mg | Freq: Once | INTRAMUSCULAR | Status: AC
  Administered 2020-03-19: 20 mg via INTRAMUSCULAR
  Filled 2020-03-19: qty 20

## 2020-03-19 MED ORDER — HYDROXYZINE HCL 25 MG PO TABS
25.0000 mg | ORAL_TABLET | Freq: Three times a day (TID) | ORAL | Status: DC | PRN
  Administered 2020-03-20 – 2020-03-22 (×2): 25 mg via ORAL
  Filled 2020-03-19 (×2): qty 1

## 2020-03-19 MED ORDER — ZIPRASIDONE MESYLATE 20 MG IM SOLR: 20.0000 mg | Freq: Once | INTRAMUSCULAR | Status: AC
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
## 2020-03-19 NOTE — ED Notes (Signed)
Pt walked out of ED. Security called and patient redirected back to room.

## 2020-03-19 NOTE — Consult Note (Signed)
  NP attempted to assess patient and notified via RN Tiffany patient is currently asleep after receiving IM medications. Will attempt assessment at a later time.

## 2020-03-19 NOTE — ED Notes (Signed)
Lab requested to patient to perform lab draw. Pt refuses. MD made aware.

## 2020-03-20 MED ORDER — HALOPERIDOL LACTATE 5 MG/ML IJ SOLN: 5.0000 mg | Freq: Every day | INTRAMUSCULAR | Status: DC | PRN

## 2020-03-20 MED ORDER — OLANZAPINE 5 MG PO TBDP
10.0000 mg | ORAL_TABLET | Freq: Once | ORAL | Status: AC
  Administered 2020-03-20: 10 mg via ORAL

## 2020-03-20 MED ORDER — HALOPERIDOL 5 MG PO TABS
5.0000 mg | ORAL_TABLET | Freq: Every day | ORAL | Status: DC | PRN
  Administered 2020-03-20 – 2020-03-21 (×2): 5 mg via ORAL
  Filled 2020-03-20 (×2): qty 1

## 2020-03-20 MED ORDER — OLANZAPINE 5 MG PO TBDP
10.0000 mg | ORAL_TABLET | Freq: Every day | ORAL | Status: DC
  Filled 2020-03-20: qty 2

## 2020-03-20 NOTE — ED Notes (Signed)
Mother called back and says pt is upset after talking to his brother.

## 2020-03-20 NOTE — ED Notes (Signed)
Security informed of pts issue with family.

## 2020-03-20 NOTE — Progress Notes (Signed)
Pt meets inpatient criteria. Referral information has been sent to the following hospitals for review:  [facility]  [facility]  [facility]  [facility]  [facility]  [facility]  [facility]  [facility]  [facility]  [facility]     Disposition will continue to follow.    Wells Guiles, MSW, LCSW, LCAS Clinical Social Worker II Disposition CSW [phone]

## 2020-03-20 NOTE — ED Notes (Signed)
Pt has been asking for his' "Power shot".  Pt is wanting Geodon IM on scheduled bases.  Pt informed that he need to follow treatment plan to expedite his discharge.

## 2020-03-20 NOTE — ED Notes (Signed)
Pt spoke to mother.

## 2020-03-20 NOTE — ED Notes (Addendum)
Pt requesting "power shot" (Geodon). EDP made aware and states to "hold off for now" as pt calm and cooperative at this time.

## 2020-03-20 NOTE — ED Notes (Signed)
Pt getting increasingly more anxious over not getting his "power shot." Pt refusing to stay in room. Security at bedside in case of increased escalation. This nurse has explained to patient multiple times today that "power shot" has not been ordered at this time. PO haldol given earlier which patient states did not help his leg pain and "power shot" is the only thing that will provide relief. Pt went willingly back to room so that I could talk to EDP. One time dose of zyprexa ordered and given.

## 2020-03-20 NOTE — ED Notes (Addendum)
Pt is requesting a "shot" says he does not want to take pills. MD notified.

## 2020-03-21 MED ORDER — GABAPENTIN 100 MG PO CAPS
100.0000 mg | ORAL_CAPSULE | Freq: Once | ORAL | Status: AC
  Administered 2020-03-22: 100 mg via ORAL
  Filled 2020-03-21: qty 1

## 2020-03-21 MED ORDER — ACETAMINOPHEN 500 MG PO TABS
1000.0000 mg | ORAL_TABLET | Freq: Once | ORAL | Status: AC
  Administered 2020-03-22: 1000 mg via ORAL
  Filled 2020-03-21: qty 2

## 2020-03-21 MED ORDER — ONDANSETRON 8 MG PO TBDP
8.0000 mg | ORAL_TABLET | Freq: Once | ORAL | Status: AC
  Administered 2020-03-21: 8 mg via ORAL
  Filled 2020-03-21: qty 1

## 2020-03-21 MED ORDER — NAPROXEN 250 MG PO TABS
500.0000 mg | ORAL_TABLET | Freq: Once | ORAL | Status: AC
  Administered 2020-03-21: 500 mg via ORAL
  Filled 2020-03-21: qty 2

## 2020-03-21 NOTE — BH Assessment (Signed)
Pt is a 40 year old male who remains at [facility] under IVC due to delusion and aggressive behavior.  Pt presented to the ED on or about 03/18/2020.  IVC petitioner is Pt's father.  Pt was reassessed this AM.  He appeared to have little insight into presenting concern.  He stated that he came to the hospital because his legs hurt. ''It feels like I've been running and running and then I had to stop.'' Pt was asked about psychiatric symptoms.  He denied suicidal ideation, homicidal ideation, and hallucination.  When asked what he wanted to do today, Pt stated that he wanted to leave so he could smoke a cigarette.    Recommended continued inpatient.

## 2020-03-21 NOTE — ED Notes (Signed)
Pt awake. Pt went to BR, came back and asking for his power shot. Pt made aware he has no shots ordered and we can not just give him one. Pt laid back in bed. Vitals were obtained at this time, and pt went back to sleep

## 2020-03-21 NOTE — Progress Notes (Signed)
Pt continues to meet inpatient criteria. Referral information has been sent to the following hospitals for review:  [facility]  [facility]  [facility]  [facility]  [facility]  [facility]  [facility]  [facility]  [facility]  [facility]     Disposition will continue to follow.    Wells Guiles, MSW, LCSW, LCAS Clinical Social Worker II Disposition CSW [phone]

## 2020-03-21 NOTE — ED Notes (Signed)
Pt repeatedly calling out in hallway, has requested "that shot" for my legs and in passing the room again added "and stomach cramps". Pt educated that geodon injection is not for leg pain, educated on other pain relief options. While obtaining VS, pt says "that guy over there is getting all the attention, I can't get nothing", informed patient that all his requests have been updated to the EDP and medication orders have been placed and are currently with me to be administered. Right after this, patient vomited on wall and floor of room. When asked if nausea was sudden or if patient felt it coming, he reports "I felt it coming but y'all were so busy with that guy over there". Nausea medication adminsitered, room cleaned, linens changed. Pt resting in room. Pt made aware further PO medications would be held at this time due to vomiting.

## 2020-03-21 NOTE — ED Notes (Addendum)
Pt VS obtained, pt proceeded to have yellow bilious emesis x 3. Dr. Rhunette Croft made aware, verbal orders for 8mg  ODT zofran STAT ONCE. Dr. to see patient.

## 2020-03-21 NOTE — ED Provider Notes (Signed)
Emergency Medicine Observation Re-evaluation Note  Ronald Hoffman is a 40 y.o. male, seen on rounds today.  Pt initially presented to the ED for complaints of V70.1 Currently, the patient is resting comfortably.  Physical Exam  BP 117/74 (BP Location: Left Arm)   Pulse 78   Temp 98.2 F (36.8 C)   Resp 18   Ht 5\' 5"  (1.651 m)   Wt 74.8 kg   SpO2 98%   BMI 27.46 kg/m  Physical Exam General: NAD Cardiac: Regular rate Lungs: No respiratory distress Psych: No significant changes  ED Course / MDM  EKG:    I have reviewed the labs performed to date as well as medications administered while in observation.  Recent changes in the last 24 hours include single dose zyprexa given yesterday afternoon for agitation.  Plan  Current plan is for inpatient psych placement Patient is under full IVC at this time.   , MD 03/21/20 [phone]

## 2020-03-21 NOTE — ED Notes (Signed)
Pt c/o leg pain, Dr. Clayborne Dana made aware. Pt becoming increasingly agitated. Orders placed for tylenol and neurontin at this time, pt can also have PRN hydroxyzine at this time per Dr. Clayborne Dana.

## 2020-03-22 DIAGNOSIS — F19959 Other psychoactive substance use, unspecified with psychoactive substance-induced psychotic disorder, unspecified: Secondary | ICD-10-CM

## 2020-03-22 MED ORDER — LOPERAMIDE HCL 2 MG PO CAPS: 2.0000 mg | ORAL_CAPSULE | ORAL | Status: DC | PRN

## 2020-03-22 MED ORDER — LORAZEPAM 2 MG/ML IJ SOLN: 1.0000 mg | Freq: Four times a day (QID) | INTRAMUSCULAR | Status: DC | PRN

## 2020-03-22 MED ORDER — CLONIDINE HCL 0.1 MG PO TABS
0.1000 mg | ORAL_TABLET | Freq: Three times a day (TID) | ORAL | Status: DC
  Administered 2020-03-22 – 2020-03-23 (×3): 0.1 mg via ORAL
  Filled 2020-03-22 (×3): qty 1

## 2020-03-22 MED ORDER — ONDANSETRON 4 MG PO TBDP
4.0000 mg | ORAL_TABLET | Freq: Four times a day (QID) | ORAL | Status: DC | PRN
  Administered 2020-03-22 – 2020-03-23 (×2): 4 mg via ORAL
  Filled 2020-03-22 (×2): qty 1

## 2020-03-22 MED ORDER — NAPROXEN 250 MG PO TABS
500.0000 mg | ORAL_TABLET | Freq: Two times a day (BID) | ORAL | Status: DC | PRN
  Administered 2020-03-23: 500 mg via ORAL
  Filled 2020-03-22: qty 2

## 2020-03-22 MED ORDER — DICYCLOMINE HCL 10 MG PO CAPS
20.0000 mg | ORAL_CAPSULE | Freq: Four times a day (QID) | ORAL | Status: DC | PRN
  Administered 2020-03-23: 20 mg via ORAL
  Filled 2020-03-22: qty 2

## 2020-03-22 MED ORDER — METHOCARBAMOL 500 MG PO TABS
500.0000 mg | ORAL_TABLET | Freq: Three times a day (TID) | ORAL | Status: DC | PRN
  Administered 2020-03-23: 500 mg via ORAL
  Filled 2020-03-22: qty 1

## 2020-03-22 MED ORDER — HYDROXYZINE HCL 25 MG PO TABS
25.0000 mg | ORAL_TABLET | Freq: Four times a day (QID) | ORAL | Status: DC | PRN
  Administered 2020-03-23: 25 mg via ORAL
  Filled 2020-03-22: qty 1

## 2020-03-22 MED ORDER — GABAPENTIN 100 MG PO CAPS
100.0000 mg | ORAL_CAPSULE | Freq: Three times a day (TID) | ORAL | Status: DC
  Administered 2020-03-22 – 2020-03-23 (×3): 100 mg via ORAL
  Filled 2020-03-22 (×3): qty 1

## 2020-03-22 MED ORDER — ONDANSETRON 4 MG PO TBDP
4.0000 mg | ORAL_TABLET | Freq: Once | ORAL | Status: AC
  Administered 2020-03-22: 4 mg via ORAL
  Filled 2020-03-22: qty 1

## 2020-03-22 MED ORDER — HALOPERIDOL LACTATE 5 MG/ML IJ SOLN: 5.0000 mg | Freq: Every day | INTRAMUSCULAR | Status: DC | PRN

## 2020-03-22 MED ORDER — HALOPERIDOL 5 MG PO TABS: 10.0000 mg | ORAL_TABLET | Freq: Every day | ORAL | Status: DC | PRN

## 2020-03-22 MED ORDER — KETAMINE HCL 50 MG/ML IJ SOLN: 4.0000 mg/kg | Freq: Once | INTRAMUSCULAR | Status: DC | PRN

## 2020-03-22 MED ORDER — LORAZEPAM 1 MG PO TABS: 1.0000 mg | ORAL_TABLET | Freq: Four times a day (QID) | ORAL | Status: DC | PRN

## 2020-03-22 MED ORDER — NICOTINE 14 MG/24HR TD PT24
14.0000 mg | MEDICATED_PATCH | Freq: Once | TRANSDERMAL | Status: AC
  Administered 2020-03-22: 14 mg via TRANSDERMAL
  Filled 2020-03-22: qty 1

## 2020-03-22 NOTE — ED Notes (Signed)
TTS in progress 
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
## 2020-03-22 NOTE — ED Notes (Signed)
Pt had shower @ 18:44
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
## 2020-03-22 NOTE — ED Notes (Signed)
Pt had shower @ 00:15 am

## 2020-03-22 NOTE — ED Notes (Signed)
Security and police at bedside.

## 2020-03-22 NOTE — ED Notes (Signed)
Pt got up out of the bed exited room and proceeded to walk in another pts room

## 2020-03-22 NOTE — ED Notes (Signed)
Pt in shower at this time. Pt has been calm and cooperative this shift.
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
## 2020-03-22 NOTE — ED Provider Notes (Signed)
Emergency Medicine Observation Re-evaluation Note  Ronald Hoffman is a 40 y.o. male, seen on rounds today.  Pt initially presented to the ED for complaints of V70.1 Currently, the patient is commited for aggitation  Physical Exam  BP 117/77 (BP Location: Left Arm)   Pulse 80   Temp 98.3 F (36.8 C) (Oral)   Resp 18   Ht 5\' 5"  (1.651 m)   Wt 74.8 kg   SpO2 100%   BMI 27.46 kg/m  Physical Exam General: nad Cardiac: ns  ED Course / MDM  EKG:    I have reviewed the labs performed to date as well as medications administered while in observation.  Recent changes in the last 24 hours include none.  Plan  Current plan is for psyc admit Pt is , MD 03/22/20 1013

## 2020-03-22 NOTE — ED Notes (Addendum)
Introduced myself to pt and patient is resting comfortably. Denies any needs at this time. Will continue to monitor pt.

## 2020-03-23 NOTE — ED Notes (Signed)
Pt c/o bilateral leg pain. Pt was given muscle relaxer and naproxen.

## 2020-03-23 NOTE — Progress Notes (Signed)
Pt has been psychiatrically cleared. Outpatient mental health/substance use resources have been placed in pt's AVS.    Wells Guiles, MSW, LCSW, LCAS Clinical Social Worker II Disposition CSW [phone]

## 2020-03-23 NOTE — ED Provider Notes (Signed)
Patient cleared by behavioral health for discharge home and follow-up as per their instructions. Patient's IVC we will undo that.   Vanetta Mulders, MD 03/23/20 [phone]

## 2020-03-23 NOTE — ED Notes (Signed)
Patient being assessed by TTS at this time.  
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
## 2020-03-23 NOTE — Consult Note (Signed)
Telepsych Consultation   Reason for Consult:  Psych consult Referring Physician:  Cathren Laine, MD Location of Patient: [facility] Location of Provider: [facility]  Patient Identification: Ronald Hoffman MRN:  [id number] Principal Diagnosis: Substance-induced psychotic disorder [facility]) Diagnosis:  Principal Problem:   Substance-induced psychotic disorder (HCC) Active Problems:   Delusional disorder (HCC)   Acute psychosis (HCC)   Opioid abuse (HCC)   Total Time spent with patient: 20 minutes  Subjective:   Ronald Hoffman is a 40 y.o. male patient admitted via IVC for substance induced agitation and aggression.  Patient presents alert and oriented, calm and cooperative. States he's been having trouble eating, "can't hold nothing down and my legs keep trembling on this bed here". Patient endorses daily Kratom use, states he hasn't used Methadone "in about a week". States he "likes it" and denies any plan on quitting; states he doesn't feel current drug use is causing any issues in life. Denies attacking any family; states he does remember going into the bank "checking on my money". Denies any interest in rehabilitation centers stating "my home is my rehab". States he is interested in outpatient services for drug use "because I can do that from home". Patient states his current outpatient medications are "oxymorphine, geodon, and valium for my anxiety and mood swings I guess. And my leg pain. Am I going to be able to go home today. I'm ready to go home. I miss my family".   Patient denies any suicidal or homicidal ideations, auditory or visual hallucinations, and does not appear to be responding to any external/internal stimuli at this time.   Collateral: Ronald Hoffman [phone] Provider reached out to family to discuss discharge and safety planning. Patient's mother concerned "well how are we going to stop him from using that stuff and get him off of drugs". Provider attempted  to explain to parents that patient's sobriety is his own choice and currently he is declining any inpatient rehabilitation programs. Provider did explain that patient is agreeing to attend outpatient programming including SAIOP program; family doesn't believe patient will follow through. Patient's father stated, "If you saw how this room looked yall would keep him". Provider attempted to explained that they are not legally obligated to allow patient to return. Mom states, "then he'll have no where to go. He is already probably going to have to do time for what happened at the bank". Provider explained the components of SAIOP programs and it could prove beneficial if patient was willing to go, but the program is completely voluntary. Mom states she will be the one to transport patient back to her home. Provider explained that if patient presentation changes to call 911 or bring the patient back to the ED.   HPI:  Ronald Hoffman is a 40 year old male who presented to [facility] via law enforcement on IVC by his father for increased agitation, aggression, and destruction of property in the home. Patient was recently arrested after entering a [employer] in [place] delusional, demanding money then assaulting a Emergency planning/management officer. Patient has extensive past mental health history of substance abuse related disorders with a [facility] hospitalization (10/2019); current UDS was negative however [facility] labs confirmed current panels do not cover synthetic drugs. Patient endorses daily Kratom use. Currently not engaged in any outpatient psychiatric services.   Past Psychiatric History:  Polysubstance abuse  -opoid abues Substance-induced psychotic disorder Delusional disorder Acute psychosis  Risk to Self:  no Risk to Others:  no  Prior Inpatient Therapy:  yes Prior Outpatient Therapy:  yes  Past Medical History:  Past Medical History:  Diagnosis Date  . Anxiety   . Seizures (HCC)    stress related; no more seizures since  first one 10 years ago; on no meds.    Past Surgical History:  Procedure Laterality Date  . HEMORRHOID SURGERY N/A 11/12/2015   Procedure: EXTENSIVE HEMORRHOIDECTOMY;  Surgeon: Franky Macho, MD;  Location: [facility];  Service: General;  Laterality: N/A;   Family History: History reviewed. No pertinent family history. Family Psychiatric  History: not noted2 Social History:  Social History   Substance and Sexual Activity  Alcohol Use Yes   Comment: last drink 10/25/2019, pt said he had 3 beers     Social History   Substance and Sexual Activity  Drug Use Yes  . Types: Heroin, Methamphetamines, Other-see comments   Comment: was using synthetic heroin daily for 2 years but stopped 2 months ago, methamphetamine daily for 3 months until June (last use 10/25/2019- "just a pinch"), "mushrooms yesterday," and "ketamine today"    Social History   Socioeconomic History  . Marital status: Single    Spouse name: Not on file  . Number of children: Not on file  . Years of education: Not on file  . Highest education level: Not on file  Occupational History  . Not on file  Tobacco Use  . Smoking status: Current Every Day Smoker    Packs/day: 0.50    Years: 10.00    Pack years: 5.00    Types: Cigarettes  . Smokeless tobacco: Never Used  Vaping Use  . Vaping Use: Not on file  Substance and Sexual Activity  . Alcohol use: Yes    Comment: last drink 10/25/2019, pt said he had 3 beers  . Drug use: Yes    Types: Heroin, Methamphetamines, Other-see comments    Comment: was using synthetic heroin daily for 2 years but stopped 2 months ago, methamphetamine daily for 3 months until June (last use 10/25/2019- "just a pinch"), "mushrooms yesterday," and "ketamine today"  . Sexual activity: Never    Birth control/protection: None  Other Topics Concern  . Not on file  Social History Narrative  . Not on file   Social Determinants of Health   Financial Resource Strain: Not on file  Food  Insecurity: Not on file  Transportation Needs: Not on file  Physical Activity: Not on file  Stress: Not on file  Social Connections: Not on file   Additional Social History:    Allergies:   Allergies  Allergen Reactions  . Hydrocodone Itching  . Penicillins Other (See Comments)    Unknown childhood allergy Has patient had a PCN reaction causing immediate rash, facial/tongue/throat swelling, SOB or lightheadedness with hypotension: unknown Has patient had a PCN reaction causing severe rash involving mucus membranes or skin necrosis: unknown Has patient had a PCN reaction that required hospitalization: unknown Has patient had a PCN reaction occurring within the last 10 years:no If all of the above answers are "NO", then may proceed with Cephalosporin use.     Labs: No results found for this or any previous visit (from the past 48 hour(s)).  Medications:  Current Facility-Administered Medications  Medication Dose Route Frequency Provider Last Rate Last Admin  . cloNIDine (CATAPRES) tablet 0.1 mg  0.1 mg Oral TID Aldean Baker, NP   0.1 mg at 03/23/20 0959  . dicyclomine (BENTYL) capsule 20 mg  20 mg Oral Q6H PRN  Aldean Baker, NP      . gabapentin (NEURONTIN) capsule 100 mg  100 mg Oral TID Aldean Baker, NP   100 mg at 03/23/20 9381  . haloperidol (HALDOL) tablet 10 mg  10 mg Oral Daily PRN Aldean Baker, NP       Or  . haloperidol lactate (HALDOL) injection 5 mg  5 mg Intramuscular Daily PRN Aldean Baker, NP      . hydrOXYzine (ATARAX/VISTARIL) tablet 25 mg  25 mg Oral Q6H PRN Aldean Baker, NP      . ketamine (KETALAR) injection 300 mg  4 mg/kg Intramuscular Once PRN Mesner, Barbara Cower, MD      . loperamide (IMODIUM) capsule 2-4 mg  2-4 mg Oral PRN Aldean Baker, NP      . LORazepam (ATIVAN) tablet 1 mg  1 mg Oral Q6H PRN Aldean Baker, NP       Or  . LORazepam (ATIVAN) injection 1 mg  1 mg Intramuscular Q6H PRN Aldean Baker, NP      . methocarbamol (ROBAXIN) tablet 500  mg  500 mg Oral Q8H PRN Aldean Baker, NP   500 mg at 03/23/20 8299  . naproxen (NAPROSYN) tablet 500 mg  500 mg Oral BID PRN Aldean Baker, NP   500 mg at 03/23/20 3716  . ondansetron (ZOFRAN-ODT) disintegrating tablet 4 mg  4 mg Oral Q6H PRN Aldean Baker, NP   4 mg at 03/22/20 1643  . QUEtiapine (SEROQUEL) tablet 100 mg  100 mg Oral Daily Cathren Laine, MD   100 mg at 03/23/20 0959  . QUEtiapine (SEROQUEL) tablet 400 mg  400 mg Oral QHS Cathren Laine, MD   400 mg at 03/22/20 2105  . traZODone (DESYREL) tablet 50 mg  50 mg Oral QHS PRN Cathren Laine, MD       Current Outpatient Medications  Medication Sig Dispense Refill  . diphenhydrAMINE (BENADRYL) 25 MG tablet Take 50 mg by mouth every 6 (six) hours as needed.    . famotidine (PEPCID) 20 MG tablet Take 1 tablet (20 mg total) by mouth 2 (two) times daily before a meal. 60 tablet 0  . gabapentin (NEURONTIN) 400 MG capsule Take 1 capsule (400 mg total) by mouth 3 (three) times daily. 90 capsule 0  . OLANZapine (ZYPREXA) 10 MG tablet Take 10 mg by mouth at bedtime.    . traZODone (DESYREL) 50 MG tablet Take 1 tablet (50 mg total) by mouth at bedtime as needed for sleep. 15 tablet 0  . hydrOXYzine (ATARAX/VISTARIL) 25 MG tablet Take 1 tablet (25 mg total) by mouth 3 (three) times daily as needed for anxiety. (Patient not taking: No sig reported) 30 tablet 0  . nicotine (NICODERM CQ - DOSED IN MG/24 HOURS) 21 mg/24hr patch Place 1 patch (21 mg total) onto the skin daily. (Patient not taking: No sig reported) 28 patch 0  . QUEtiapine (SEROQUEL) 100 MG tablet Take 1 tablet (100 mg total) by mouth daily. (Patient not taking: No sig reported) 30 tablet 0  . QUEtiapine (SEROQUEL) 400 MG tablet Take 2 tablets (800 mg total) by mouth at bedtime. (Patient not taking: No sig reported) 60 tablet 0   Musculoskeletal: Strength & Muscle Tone: within normal limits Gait & Station: normal Patient leans: N/A  Psychiatric Specialty Exam: Physical  Exam Psychiatric:        Attention and Perception: Attention and perception normal.        Mood  and Affect: Affect normal.        Speech: Speech normal.        Behavior: Behavior normal. Behavior is cooperative.        Thought Content: Thought content normal.        Cognition and Memory: Cognition normal.        Judgment: Judgment is impulsive.     Review of Systems  Psychiatric/Behavioral: Negative.   All other systems reviewed and are negative.   Blood pressure 119/85, pulse (!) 108, temperature 98 F (36.7 C), temperature source Oral, resp. rate 16, height 5\' 5"  (1.651 m), weight 74.8 kg, SpO2 96 %.Body mass index is 27.46 kg/m.  General Appearance: Casual  Eye Contact:  Good  Speech:  Clear and Coherent  Volume:  Normal  Mood:  Euthymic  Affect:  Appropriate  Thought Process:  Coherent  Orientation:  Full (Time, Place, and Person)  Thought Content:  Logical  Suicidal Thoughts:  No  Homicidal Thoughts:  No  Memory:  Immediate;   Fair Recent;   Fair  Judgement:  Fair  Insight:  Shallow  Psychomotor Activity:  Normal  Concentration:  Concentration: Fair and Attention Span: Fair  Recall:  of Knowledge:  Fair  Language:  Fair  Akathisia:  NA  Handed:  Right  AIMS (if indicated):     Assets:  Communication Skills Housing Physical Health Resilience Social Support  ADL's:  Intact  Cognition:  WNL  Sleep:      Treatment Plan Summary: Plan discharge patient with outpatient psychiatric and substance abuse resources for individual follow-up.  Disposition: No evidence of imminent risk to self or others at present.   Patient does not meet criteria for psychiatric inpatient admission. Discussed crisis plan, support from social network, calling 911, coming to the Emergency Department, and calling Suicide Hotline.  This service was provided via telemedicine using a 2-way, interactive audio and video technology.  Names of all persons participating in this  telemedicine service and their role in this encounter. Name: [employer] Role: PMHNP  Name: Maxie Barb Role: Attending MD  Name: Nelly Rout Role: patient  Name: Alexandria Lodge & Dorene Sorrow Role: parents    Barbette Merino, NP 03/23/2020 10:36 AM

## 2020-03-23 NOTE — ED Notes (Signed)
Rescinded IVC papers faxed to Magistrate. 

## 2021-01-06 ENCOUNTER — Ambulatory Visit
Admission: RE | Admit: 2021-01-06 | Discharge: 2021-01-06 | Disposition: A | Discharge status: Home / Self Care | Payer: Self-pay | Source: Ambulatory Visit | Attending: Family Medicine | Admitting: Family Medicine

## 2021-01-06 ENCOUNTER — Other Ambulatory Visit: Payer: Self-pay

## 2021-01-06 VITALS — BP 109/74 | HR 68 | Temp 99.0°F | Resp 18

## 2021-01-06 DIAGNOSIS — H10501 Unspecified blepharoconjunctivitis, right eye: Secondary | ICD-10-CM

## 2021-01-06 DIAGNOSIS — J069 Acute upper respiratory infection, unspecified: Secondary | ICD-10-CM

## 2021-01-06 DIAGNOSIS — L0390 Cellulitis, unspecified: Secondary | ICD-10-CM

## 2021-01-06 MED ORDER — SULFAMETHOXAZOLE-TRIMETHOPRIM 800-160 MG PO TABS: 1.0000 | ORAL_TABLET | Freq: Two times a day (BID) | ORAL | 0 refills | Status: DC

## 2021-01-06 MED ORDER — PREDNISONE 20 MG PO TABS: 20.0000 mg | ORAL_TABLET | Freq: Every day | ORAL | 0 refills | Status: AC

## 2021-01-06 MED ORDER — POLYMYXIN B-TRIMETHOPRIM 10000-0.1 UNIT/ML-% OP SOLN: 2.0000 [drp] | Freq: Three times a day (TID) | OPHTHALMIC | 0 refills | Status: AC

## 2021-01-06 NOTE — ED Triage Notes (Signed)
Patient states he has a bad infection in his right eye. Eyelid is swollen. He has nausea and dizziness. He states he is congested and body aches.  He states he has bad lesions on his right leg and back.   He states that the lesions came 1st and then the congestion.  He tried allergy meds without any relief  Denies exposure to anything that he may be allergic to.Marland Kitchen

## 2021-01-06 NOTE — ED Provider Notes (Signed)
[facility]     CSN: [id number] Arrival date & time: 01/06/21  1200      History   Chief Complaint No chief complaint on file.   HPI Ronald Hoffman is a 40 y.o. male.   HPI Patient with history of opiate use and delusional disorder presents today for evaluation of right eye infection, nausea, dizziness cough congestion generalized body aches and skin wounds which he is concern for infection. He reports associated chills. Unknown if he has had fever. Endorses one day of poor appetite. Tolerating fluids.  Past Medical History:  Diagnosis Date   Anxiety    Seizures (HCC)    stress related; no more seizures since first one 10 years ago; on no meds.    Patient Active Problem List   Diagnosis Date Noted   Opioid abuse (HCC) 03/19/2020   Substance-induced psychotic disorder (HCC) 03/19/2020   Acute psychosis (HCC) 10/26/2019   Delusional disorder (HCC) 10/25/2019    Past Surgical History:  Procedure Laterality Date   HEMORRHOID SURGERY N/A 11/12/2015   Procedure: EXTENSIVE HEMORRHOIDECTOMY;  Surgeon: Franky Macho, MD;  Location: [facility];  Service: General;  Laterality: N/A;       Home Medications    Prior to Admission medications   Medication Sig Start Date End Date Taking? Authorizing Provider  sulfamethoxazole-trimethoprim (BACTRIM DS) 800-160 MG tablet Take 1 tablet by mouth 2 (two) times daily. 01/06/21  Yes Bing Neighbors, FNP  trimethoprim-polymyxin b (POLYTRIM) ophthalmic solution Place 2 drops into the right eye in the morning, at noon, and at bedtime for 7 days. 01/06/21 01/13/21 Yes Bing Neighbors, FNP  diphenhydrAMINE (BENADRYL) 25 MG tablet Take 50 mg by mouth every 6 (six) hours as needed.    [provider]  famotidine (PEPCID) 20 MG tablet Take 1 tablet (20 mg total) by mouth 2 (two) times daily before a meal. 10/31/19   Aldean Baker, NP  gabapentin (NEURONTIN) 400 MG capsule Take 1 capsule (400 mg total) by mouth 3 (three)  times daily. 10/31/19   Aldean Baker, NP  hydrOXYzine (ATARAX/VISTARIL) 25 MG tablet Take 1 tablet (25 mg total) by mouth 3 (three) times daily as needed for anxiety. Patient not taking: Reported on 03/21/2020 10/31/19   Aldean Baker, NP  nicotine (NICODERM CQ - DOSED IN MG/24 HOURS) 21 mg/24hr patch Place 1 patch (21 mg total) onto the skin daily. Patient not taking: No sig reported 10/31/19   Aldean Baker, NP  OLANZapine (ZYPREXA) 10 MG tablet Take 10 mg by mouth at bedtime. 11/16/19   [provider]  QUEtiapine (SEROQUEL) 100 MG tablet Take 1 tablet (100 mg total) by mouth daily. Patient not taking: Reported on 03/21/2020 10/31/19   Aldean Baker, NP  QUEtiapine (SEROQUEL) 400 MG tablet Take 2 tablets (800 mg total) by mouth at bedtime. Patient not taking: Reported on 03/21/2020 10/31/19   Aldean Baker, NP  traZODone (DESYREL) 50 MG tablet Take 1 tablet (50 mg total) by mouth at bedtime as needed for sleep. 10/31/19   Aldean Baker, NP    Family History History reviewed. No pertinent family history.  Social History Social History   Tobacco Use   Smoking status: Former    Packs/day: 0.50    Years: 10.00    Pack years: 5.00    Types: Cigarettes   Smokeless tobacco: Never  Vaping Use   Vaping Use: Every day  Substance Use Topics   Alcohol use: Yes  Comment: last drink 10/25/2019, pt said he had 3 beers   Drug use: Yes    Types: Heroin, Methamphetamines, Other-see comments    Comment: was using synthetic heroin daily for 2 years but stopped 2 months ago, methamphetamine daily for 3 months until June (last use 10/25/2019- "just a pinch"), "mushrooms yesterday," and "ketamine today"     Allergies   Hydrocodone and Penicillins   Review of Systems Review of Systems Pertinent negatives listed in HPI   Physical Exam Triage Vital Signs ED Triage Vitals  Enc Vitals Group     BP 01/06/21 1310 109/74     Pulse Rate 01/06/21 1310 68     Resp 01/06/21 1310 18     Temp  01/06/21 1310 99 F (37.2 C)     Temp Source 01/06/21 1310 Oral     SpO2 01/06/21 1310 96 %     Weight --      Height --      Head Circumference --      Peak Flow --      Pain Score 01/06/21 1307 6     Pain Loc --      Pain Edu? --      Excl. in [place]? --    No data found.  Updated Vital Signs BP 109/74 (BP Location: Right Arm)   Pulse 68   Temp 99 F (37.2 C) (Oral)   Resp 18   SpO2 96%   Visual Acuity Right Eye Distance:   Left Eye Distance:   Bilateral Distance:    Right Eye Near:   Left Eye Near:    Bilateral Near:     Physical Exam  General Appearance:    Alert, cooperative, no distress  HENT:   Normocephalic, ears normal, nares mucosal edema with congestion, rhinorrhea, oropharynx    Eyes:    PERRL, conjunctiva/corneas clear, EOM's intact       Lungs:     Clear to auscultation bilaterally, respirations unlabored  Heart:    Regular rate and rhythm  Neurologic:   Awake, alert, oriented x 3. No apparent focal neurological           defect.   Skin :     Erythematous open skin abrasions left lower leg           UC Treatments / Results  Labs (all labs ordered are listed, but only abnormal results are displayed) Labs Reviewed - No data to display  EKG   Radiology No results found.  Procedures Procedures (including critical care time)  Medications Ordered in UC Medications - No data to display  Initial Impression / Assessment and Plan / UC Course  I have reviewed the triage vital signs and the nursing notes.  Pertinent labs & imaging results that were available during my care of the patient were reviewed by me and considered in my medical decision making (see chart for details).     Skin infection tx with Bactrim URI tx with prednisone for cough and chest congestion Blepharoconjunctivitis tx with Polytrim  Final Clinical Impressions(s) / UC Diagnoses   Final diagnoses:  Wound cellulitis  Upper respiratory tract infection, unspecified type   Blepharoconjunctivitis of right eye, unspecified blepharoconjunctivitis type   Discharge Instructions   None    ED Prescriptions     Medication Sig Dispense Auth. Provider   predniSONE (DELTASONE) 20 MG tablet Take 1 tablet (20 mg total) by mouth daily with breakfast for 5 days. 5 tablet Scot Jun, FNP  trimethoprim-polymyxin b (POLYTRIM) ophthalmic solution Place 2 drops into the right eye in the morning, at noon, and at bedtime for 7 days. 10 mL Scot Jun, FNP   sulfamethoxazole-trimethoprim (BACTRIM DS) 800-160 MG tablet Take 1 tablet by mouth 2 (two) times daily. 20 tablet Scot Jun, FNP      PDMP not reviewed this encounter.   Scot Jun, FNP 01/12/21 [phone]

## 2021-02-13 ENCOUNTER — Ambulatory Visit: Payer: Self-pay

## 2021-02-20 ENCOUNTER — Emergency Department (HOSPITAL_COMMUNITY): Payer: Self-pay

## 2021-02-20 ENCOUNTER — Inpatient Hospital Stay (HOSPITAL_COMMUNITY)
Admission: EM | Admit: 2021-02-20 | Discharge: 2021-02-25 | DRG: 309 | Disposition: A | Discharge status: Home / Self Care | Payer: Self-pay | Attending: Internal Medicine | Admitting: Internal Medicine

## 2021-02-20 DIAGNOSIS — R946 Abnormal results of thyroid function studies: Secondary | ICD-10-CM | POA: Diagnosis present

## 2021-02-20 DIAGNOSIS — F1910 Other psychoactive substance abuse, uncomplicated: Secondary | ICD-10-CM

## 2021-02-20 DIAGNOSIS — Z79899 Other long term (current) drug therapy: Secondary | ICD-10-CM

## 2021-02-20 DIAGNOSIS — D75839 Thrombocytosis, unspecified: Secondary | ICD-10-CM | POA: Diagnosis present

## 2021-02-20 DIAGNOSIS — E876 Hypokalemia: Secondary | ICD-10-CM | POA: Diagnosis present

## 2021-02-20 DIAGNOSIS — R440 Auditory hallucinations: Secondary | ICD-10-CM | POA: Diagnosis present

## 2021-02-20 DIAGNOSIS — G8929 Other chronic pain: Secondary | ICD-10-CM | POA: Diagnosis present

## 2021-02-20 DIAGNOSIS — R739 Hyperglycemia, unspecified: Secondary | ICD-10-CM | POA: Diagnosis present

## 2021-02-20 DIAGNOSIS — Z880 Allergy status to penicillin: Secondary | ICD-10-CM

## 2021-02-20 DIAGNOSIS — R6510 Systemic inflammatory response syndrome (SIRS) of non-infectious origin without acute organ dysfunction: Secondary | ICD-10-CM

## 2021-02-20 DIAGNOSIS — R072 Precordial pain: Secondary | ICD-10-CM | POA: Diagnosis present

## 2021-02-20 DIAGNOSIS — R251 Tremor, unspecified: Secondary | ICD-10-CM | POA: Diagnosis not present

## 2021-02-20 DIAGNOSIS — Z885 Allergy status to narcotic agent status: Secondary | ICD-10-CM

## 2021-02-20 DIAGNOSIS — F1590 Other stimulant use, unspecified, uncomplicated: Secondary | ICD-10-CM | POA: Diagnosis present

## 2021-02-20 DIAGNOSIS — M79606 Pain in leg, unspecified: Secondary | ICD-10-CM

## 2021-02-20 DIAGNOSIS — R000 Tachycardia, unspecified: Principal | ICD-10-CM

## 2021-02-20 DIAGNOSIS — A419 Sepsis, unspecified organism: Secondary | ICD-10-CM

## 2021-02-20 DIAGNOSIS — M79605 Pain in left leg: Secondary | ICD-10-CM | POA: Diagnosis present

## 2021-02-20 DIAGNOSIS — E0590 Thyrotoxicosis, unspecified without thyrotoxic crisis or storm: Secondary | ICD-10-CM

## 2021-02-20 DIAGNOSIS — M79604 Pain in right leg: Secondary | ICD-10-CM | POA: Diagnosis present

## 2021-02-20 DIAGNOSIS — R441 Visual hallucinations: Secondary | ICD-10-CM | POA: Diagnosis present

## 2021-02-20 DIAGNOSIS — F209 Schizophrenia, unspecified: Secondary | ICD-10-CM | POA: Diagnosis present

## 2021-02-20 DIAGNOSIS — G40909 Epilepsy, unspecified, not intractable, without status epilepticus: Secondary | ICD-10-CM | POA: Diagnosis present

## 2021-02-20 DIAGNOSIS — E872 Acidosis, unspecified: Secondary | ICD-10-CM

## 2021-02-20 DIAGNOSIS — Z8659 Personal history of other mental and behavioral disorders: Secondary | ICD-10-CM

## 2021-02-20 DIAGNOSIS — Z20822 Contact with and (suspected) exposure to covid-19: Secondary | ICD-10-CM | POA: Diagnosis present

## 2021-02-20 DIAGNOSIS — F29 Unspecified psychosis not due to a substance or known physiological condition: Secondary | ICD-10-CM

## 2021-02-20 DIAGNOSIS — Z87891 Personal history of nicotine dependence: Secondary | ICD-10-CM

## 2021-02-20 HISTORY — DX: Systemic inflammatory response syndrome (sirs) of non-infectious origin without acute organ dysfunction: R65.10

## 2021-02-20 LAB — CBC WITH DIFFERENTIAL/PLATELET
Abs Immature Granulocytes: 0.05 10*3/uL (ref 0.00–0.07)
Basophils Absolute: 0.1 10*3/uL (ref 0.0–0.1)
Basophils Relative: 0 %
Eosinophils Absolute: 0.1 10*3/uL (ref 0.0–0.5)
Eosinophils Relative: 1 %
HCT: 42 % (ref 39.0–52.0)
Hemoglobin: 14.4 g/dL (ref 13.0–17.0)
Immature Granulocytes: 0 %
Lymphocytes Relative: 12 %
Lymphs Abs: 2.1 10*3/uL (ref 0.7–4.0)
MCH: 29.3 pg (ref 26.0–34.0)
MCHC: 34.3 g/dL (ref 30.0–36.0)
MCV: 85.5 fL (ref 80.0–100.0)
Monocytes Absolute: 0.8 10*3/uL (ref 0.1–1.0)
Monocytes Relative: 5 %
Neutro Abs: 14.1 10*3/uL — ABNORMAL HIGH (ref 1.7–7.7)
Neutrophils Relative %: 82 %
Platelets: 428 10*3/uL — ABNORMAL HIGH (ref 150–400)
RBC: 4.91 MIL/uL (ref 4.22–5.81)
RDW: 12.2 % (ref 11.5–15.5)
WBC: 17.2 10*3/uL — ABNORMAL HIGH (ref 4.0–10.5)
nRBC: 0 % (ref 0.0–0.2)

## 2021-02-20 LAB — URINALYSIS, ROUTINE W REFLEX MICROSCOPIC
Bilirubin Urine: NEGATIVE
Glucose, UA: NEGATIVE mg/dL
Hgb urine dipstick: NEGATIVE
Ketones, ur: NEGATIVE mg/dL
Leukocytes,Ua: NEGATIVE
Nitrite: NEGATIVE
Protein, ur: 30 mg/dL — AB
Specific Gravity, Urine: 1.017 (ref 1.005–1.030)
pH: 7 (ref 5.0–8.0)

## 2021-02-20 LAB — COMPREHENSIVE METABOLIC PANEL
ALT: 20 U/L (ref 0–44)
AST: 19 U/L (ref 15–41)
Albumin: 4.4 g/dL (ref 3.5–5.0)
Alkaline Phosphatase: 80 U/L (ref 38–126)
Anion gap: 10 (ref 5–15)
BUN: 8 mg/dL (ref 6–20)
CO2: 27 mmol/L (ref 22–32)
Calcium: 9.4 mg/dL (ref 8.9–10.3)
Chloride: 100 mmol/L (ref 98–111)
Creatinine, Ser: 0.68 mg/dL (ref 0.61–1.24)
GFR, Estimated: >60 mL/min (ref >60)
Glucose, Bld: 158 mg/dL — ABNORMAL HIGH (ref 70–99)
Potassium: 3.9 mmol/L (ref 3.5–5.1)
Sodium: 137 mmol/L (ref 135–145)
Total Bilirubin: 0.3 mg/dL (ref 0.3–1.2)
Total Protein: 7.5 g/dL (ref 6.5–8.1)

## 2021-02-20 LAB — BLOOD GAS, VENOUS
Acid-Base Excess: 1.6 mmol/L (ref 0.0–2.0)
Bicarbonate: 24.8 mmol/L (ref 20.0–28.0)
Drawn by: 6381
FIO2: 21
O2 Saturation: 78.7 %
Patient temperature: 37.2
pCO2, Ven: 47.4 mmHg (ref 44.0–60.0)
pH, Ven: 7.365 (ref 7.250–7.430)
pO2, Ven: 46.5 mmHg — ABNORMAL HIGH (ref 32.0–45.0)

## 2021-02-20 LAB — PROTIME-INR
INR: 1 (ref 0.8–1.2)
Prothrombin Time: 12.7 seconds (ref 11.4–15.2)

## 2021-02-20 LAB — RAPID URINE DRUG SCREEN, HOSP PERFORMED
Amphetamines: POSITIVE — AB
Barbiturates: NOT DETECTED
Benzodiazepines: POSITIVE — AB
Cocaine: NOT DETECTED
Opiates: NOT DETECTED
Tetrahydrocannabinol: NOT DETECTED

## 2021-02-20 LAB — RESP PANEL BY RT-PCR (FLU A&B, COVID) ARPGX2
Influenza A by PCR: NEGATIVE
Influenza B by PCR: NEGATIVE
SARS Coronavirus 2 by RT PCR: NEGATIVE

## 2021-02-20 LAB — TROPONIN I (HIGH SENSITIVITY)
Troponin I (High Sensitivity): 3 ng/L (ref <18)
Troponin I (High Sensitivity): 3 ng/L (ref <18)

## 2021-02-20 LAB — LACTIC ACID, PLASMA
Lactic Acid, Venous: 2.6 mmol/L (ref 0.5–1.9)
Lactic Acid, Venous: 2.4 mmol/L (ref 0.5–1.9)

## 2021-02-20 LAB — APTT: aPTT: 27 seconds (ref 24–36)

## 2021-02-20 LAB — CK: Total CK: 58 U/L (ref 49–397)

## 2021-02-20 LAB — ETHANOL: Alcohol, Ethyl (B): <10 mg/dL (ref <10)

## 2021-02-20 LAB — ACETAMINOPHEN LEVEL: Acetaminophen (Tylenol), Serum: <10 ug/mL — ABNORMAL LOW (ref 10–30)

## 2021-02-20 LAB — LIPASE, BLOOD: Lipase: 28 U/L (ref 11–51)

## 2021-02-20 MED ORDER — LACTATED RINGERS IV BOLUS (SEPSIS)
1000.0000 mL | Freq: Once | INTRAVENOUS | Status: AC
  Administered 2021-02-20: 1000 mL via INTRAVENOUS

## 2021-02-20 MED ORDER — VANCOMYCIN HCL 1750 MG/350ML IV SOLN
1750.0000 mg | Freq: Two times a day (BID) | INTRAVENOUS | Status: DC
  Administered 2021-02-20 – 2021-02-22 (×3): 1750 mg via INTRAVENOUS
  Filled 2021-02-20 (×6): qty 350

## 2021-02-20 MED ORDER — LORAZEPAM 2 MG/ML IJ SOLN
1.0000 mg | Freq: Once | INTRAMUSCULAR | Status: AC
  Administered 2021-02-20: 1 mg via INTRAVENOUS
  Filled 2021-02-20: qty 1

## 2021-02-20 MED ORDER — LACTATED RINGERS IV SOLN: INTRAVENOUS | Status: AC

## 2021-02-20 MED ORDER — VANCOMYCIN HCL IN DEXTROSE 1-5 GM/200ML-% IV SOLN: 1000.0000 mg | Freq: Once | INTRAVENOUS | Status: DC

## 2021-02-20 MED ORDER — SODIUM CHLORIDE 0.9 % IV SOLN: 2.0000 g | Freq: Once | INTRAVENOUS | Status: DC

## 2021-02-20 MED ORDER — METRONIDAZOLE 500 MG/100ML IV SOLN
500.0000 mg | Freq: Once | INTRAVENOUS | Status: AC
  Administered 2021-02-20: 500 mg via INTRAVENOUS
  Filled 2021-02-20: qty 100

## 2021-02-20 MED ORDER — SODIUM CHLORIDE 0.9 % IV SOLN
2.0000 g | Freq: Three times a day (TID) | INTRAVENOUS | Status: DC
  Administered 2021-02-20 – 2021-02-22 (×5): 2 g via INTRAVENOUS
  Filled 2021-02-20 (×5): qty 2

## 2021-02-20 MED ORDER — LACTATED RINGERS IV BOLUS (SEPSIS)
500.0000 mL | Freq: Once | INTRAVENOUS | Status: AC
  Administered 2021-02-20: 500 mL via INTRAVENOUS

## 2021-02-20 NOTE — ED Notes (Signed)
Pt in room actively hallucinating. Reports "I am talking to the two people in my head." Encouragement and support provided. Sitter at bedside. Will continue to monitor.

## 2021-02-20 NOTE — Sepsis Progress Note (Signed)
Following per sepsis protocol   

## 2021-02-20 NOTE — ED Notes (Signed)
Pt informs this RN that he has hx of polysubstance abuse, but has been clean for 5 days, This RN notified EDP Zackowski

## 2021-02-20 NOTE — ED Provider Notes (Signed)
[facility] EMERGENCY DEPARTMENT Provider Note   CSN: [id number] Arrival date & time: 02/20/21  1857     History  Chief Complaint  Patient presents with   Ingestion   Tachycardia   Delusional    Ronald Hoffman is a 41 y.o. male.  Patient brought in by EMS.  Brought in from home.  EMS told family they are unsure if patient ingested something there was some question about ingesting nail polish removal per the family.  Patient has a history of schizophrenia.  Patient denies being on any medication.  Patient tells me that for the past several days he has been hearing and seeing things.  He was noted to be tachycardic with a heart rate of 145.  Blood pressure was 147/81 oxygen saturations 100% on room air.  Patient tells me he is having some left anterior chest pain.  He denies ingestion of anything.  We will do tox screen.  Only hard-core finding is that patient is tachycardic.  Does not have a fever.  Patient is cooperative but does seem to be having psychosis ongoing.  Past medical history otherwise is stated for seizures last one 10 years ago.  Patient looks like in 2021 was on hydroxyzine and Zyprexa and does a role.  And Seroquel.  He states he is not on any medicine now.      Home Medications Prior to Admission medications   Medication Sig Start Date End Date Taking? Authorizing Provider  citalopram (CELEXA) 20 MG tablet Take 20 mg by mouth daily. 11/14/20   [provider]  diphenhydrAMINE (BENADRYL) 25 MG tablet Take 50 mg by mouth every 6 (six) hours as needed.    [provider]  famotidine (PEPCID) 20 MG tablet Take 1 tablet (20 mg total) by mouth 2 (two) times daily before a meal. 10/31/19   Connye Burkitt, NP  gabapentin (NEURONTIN) 400 MG capsule Take 1 capsule (400 mg total) by mouth 3 (three) times daily. 10/31/19   Connye Burkitt, NP  hydrOXYzine (ATARAX/VISTARIL) 25 MG tablet Take 1 tablet (25 mg total) by mouth 3 (three) times daily as needed for  anxiety. Patient not taking: Reported on 03/21/2020 10/31/19   Connye Burkitt, NP  nicotine (NICODERM CQ - DOSED IN MG/24 HOURS) 21 mg/24hr patch Place 1 patch (21 mg total) onto the skin daily. Patient not taking: No sig reported 10/31/19   Connye Burkitt, NP  OLANZapine (ZYPREXA) 10 MG tablet Take 10 mg by mouth at bedtime. 11/16/19   [provider]  QUEtiapine (SEROQUEL) 100 MG tablet Take 1 tablet (100 mg total) by mouth daily. Patient not taking: Reported on 03/21/2020 10/31/19   Connye Burkitt, NP  QUEtiapine (SEROQUEL) 400 MG tablet Take 2 tablets (800 mg total) by mouth at bedtime. Patient not taking: Reported on 03/21/2020 10/31/19   Connye Burkitt, NP  sulfamethoxazole-trimethoprim (BACTRIM DS) 800-160 MG tablet Take 1 tablet by mouth 2 (two) times daily. 01/06/21   Scot Jun, FNP  traZODone (DESYREL) 50 MG tablet Take 1 tablet (50 mg total) by mouth at bedtime as needed for sleep. 10/31/19   Connye Burkitt, NP      Allergies    Hydrocodone and Penicillins    Review of Systems   Review of Systems  Unable to perform ROS: Psychiatric disorder   Physical Exam Updated Vital Signs BP (!) 156/88    Pulse (!) 110    Temp 99 F (37.2 C) (Oral)  Resp 16    Ht 1.727 m (5\' 8" )    Wt 76.2 kg    SpO2 99%    BMI 25.54 kg/m  Physical Exam Vitals and nursing note reviewed.  Constitutional:      General: He is not in acute distress.    Appearance: Normal appearance. He is well-developed.  HENT:     Head: Normocephalic and atraumatic.  Eyes:     Extraocular Movements: Extraocular movements intact.     Conjunctiva/sclera: Conjunctivae normal.     Pupils: Pupils are equal, round, and reactive to light.  Cardiovascular:     Rate and Rhythm: Regular rhythm. Tachycardia present.     Heart sounds: No murmur heard. Pulmonary:     Effort: Pulmonary effort is normal. No respiratory distress.     Breath sounds: Normal breath sounds.  Abdominal:     Palpations: Abdomen is soft.      Tenderness: There is no abdominal tenderness.  Musculoskeletal:        General: No swelling.     Cervical back: Normal range of motion and neck supple.  Skin:    General: Skin is warm and dry.     Capillary Refill: Capillary refill takes less than 2 seconds.     Comments: Patient does have some superficial scratches on his left hand right leg and chest area.  None of them appear infected.  Neurological:     General: No focal deficit present.     Mental Status: He is alert.  Psychiatric:     Comments: Patient admits to auditory and visual hallucinations.  His thought content is not normal.    ED Results / Procedures / Treatments   Labs (all labs ordered are listed, but only abnormal results are displayed) Labs Reviewed  COMPREHENSIVE METABOLIC PANEL - Abnormal; Notable for the following components:      Result Value   Glucose, Bld 158 (*)    All other components within normal limits  CBC WITH DIFFERENTIAL/PLATELET - Abnormal; Notable for the following components:   WBC 17.2 (*)    Platelets 428 (*)    Neutro Abs 14.1 (*)    All other components within normal limits  RAPID URINE DRUG SCREEN, HOSP PERFORMED - Abnormal; Notable for the following components:   Benzodiazepines POSITIVE (*)    Amphetamines POSITIVE (*)    All other components within normal limits  URINALYSIS, ROUTINE W REFLEX MICROSCOPIC - Abnormal; Notable for the following components:   APPearance CLOUDY (*)    Protein, ur 30 (*)    Bacteria, UA RARE (*)    All other components within normal limits  LACTIC ACID, PLASMA - Abnormal; Notable for the following components:   Lactic Acid, Venous 2.6 (*)    All other components within normal limits  ACETAMINOPHEN LEVEL - Abnormal; Notable for the following components:   Acetaminophen (Tylenol), Serum <10 (*)    All other components within normal limits  LACTIC ACID, PLASMA - Abnormal; Notable for the following components:   Lactic Acid, Venous 2.4 (*)    All other  components within normal limits  BLOOD GAS, VENOUS - Abnormal; Notable for the following components:   pO2, Ven 46.5 (*)    All other components within normal limits  RESP PANEL BY RT-PCR (FLU A&B, COVID) ARPGX2  CULTURE, BLOOD (ROUTINE X 2)  CULTURE, BLOOD (ROUTINE X 2)  URINE CULTURE  LIPASE, BLOOD  ETHANOL  PROTIME-INR  APTT  CK  TROPONIN I (HIGH SENSITIVITY)  TROPONIN  I (HIGH SENSITIVITY)    EKG EKG Interpretation  Date/Time:  Wednesday February 20 2021 19:17:39 EST Ventricular Rate:  136 PR Interval:  142 QRS Duration: 94 QT Interval:  278 QTC Calculation: 419 R Axis:   -80 Text Interpretation: Sinus tachycardia Probable left atrial enlargement Left axis deviation RSR' in V1 or V2, probably normal variant Borderline T abnormalities, anterior leads Confirmed by Fredia Sorrow [phone]) on 02/20/2021 7:25:04 PM  Radiology DG Chest Port 1 View  Result Date: 02/20/2021 CLINICAL DATA:  Chest pain. EXAM: PORTABLE CHEST 1 VIEW COMPARISON:  None. FINDINGS: The heart size and mediastinal contours are within normal limits. There is a 5 mm nodular density projecting over the left lower lung. The lungs are otherwise clear. No acute fractures are seen. IMPRESSION: 1. No acute cardiopulmonary process. 2. Nodular density projecting over the left lower lung, indeterminate. Recommend follow-up nonemergent chest CT. Electronically Signed   By: Ronney Asters M.D.   On: 02/20/2021 20:33    Procedures Procedures  React monitor and EKG consistent with sinus tachycardia.  Medications Ordered in ED Medications  lactated ringers infusion (has no administration in time range)  lactated ringers bolus 1,000 mL (0 mLs Intravenous Stopped 02/20/21 2137)    And  lactated ringers bolus 1,000 mL (1,000 mLs Intravenous New Bag/Given 02/20/21 2139)    And  lactated ringers bolus 500 mL (has no administration in time range)  ceFEPIme (MAXIPIME) 2 g in sodium chloride 0.9 % 100 mL IVPB (0 g Intravenous  Stopped 02/20/21 2137)  vancomycin (VANCOREADY) IVPB 1750 mg/350 mL (has no administration in time range)  LORazepam (ATIVAN) injection 1 mg (1 mg Intravenous Given 02/20/21 1958)  metroNIDAZOLE (FLAGYL) IVPB 500 mg (500 mg Intravenous New Bag/Given 02/20/21 2139)    ED Course/ Medical Decision Making/ A&P                           Medical Decision Making  CRITICAL CARE Performed by: Fredia Sorrow Total critical care time: 60 minutes Critical care time was exclusive of separately billable procedures and treating other patients. Critical care was necessary to treat or prevent imminent or life-threatening deterioration. Critical care was time spent personally by me on the following activities: development of treatment plan with patient and/or surrogate as well as nursing, discussions with consultants, evaluation of patient's response to treatment, examination of patient, obtaining history from patient or surrogate, ordering and performing treatments and interventions, ordering and review of laboratory studies, ordering and review of radiographic studies, pulse oximetry and re-evaluation of patient's condition.   Will clear patient clinically for any significant ingestion.  Patient is tachycardic.  We will try some IV Ativan.  Patient clearly appears to be having some psychosis.  Patient was significantly tachycardic.  Temp 99.  But tachypneic.  But blood pressure is fine.  Initial lactic acid was 2.6.  And with the tachycardia and also had a markedly elevated white blood cell count at 17,000.  Based on this did initiate sepsis protocol.  Patient received broad-spectrum antibiotics 30 cc/kg fluid.  Heart rate is coming down some to around 110.  Patient does appear less toxic.  And is walking around.  Chest x-ray negative COVID influenza testing negative blood cultures pending.  There was also the concern about possible ingestion.  So venous blood gas was done pH is normal 7.36.  Troponins were  normal urine drug screen was positive for benzos and amphetamines.  This certainly could play  a role with the tachycardia.  Urinalysis is negative.  Complete metabolic panel normal renal function normal CK was normal Tylenol was normal alcohol level was normal chest x-ray had no acute findings.  Overall I do not see any evidence of any significant ingestion.  Patient had some question about acetone.  That usually results in hypotension , depressed mental status.  Patient does not have any.    We will discussed with hospitalist for admission.  They will also probably have to do a behavioral health consult through telemetry as well. TTS consult has been initiated.  Final Clinical Impression(s) / ED Diagnoses Final diagnoses:  Psychosis, unspecified psychosis type ([place])  Tachycardia  Sepsis, due to unspecified organism, unspecified whether acute organ dysfunction present [facility])    Rx / DC Orders ED Discharge Orders     None         Fredia Sorrow, MD 02/20/21 2241

## 2021-02-20 NOTE — ED Triage Notes (Signed)
Pt to ED via EMS from home c/o ingestion . Per EMS pt family unsure of what pt ingested, possible nail polish remover per the family.  Pt has hx of schizophrenia, an reported that pt orientation is his baseline. Last VS: 147/81, HR 145, rr20, 100%,RA. Cbg 153. No medications given by EMS.Pupils dilated. GCS 13.

## 2021-02-20 NOTE — Progress Notes (Signed)
Pharmacy Antibiotic Note  Ronald Hoffman is a 41 y.o. male admitted on 02/20/2021 with  unknown source .  Pharmacy has been consulted for vancomycin and cefepime dosing.  Plan: Vancomycin 1750 mg iv q12h  X 7 days  Est. AUC 525 mcg*h/mL IBW: 68.4 kg TBW: 76.2 Kg Scr: 0.68 Height: 68 inches Ke: 0.122 hr-1 T 1/2: 5.7 hours Time to steady-state: 28.5 hours Assess a peak and trough with the 4th dose  Cefepime 2 grams iv q8h X 7 days  Height: 5\' 8"  (172.7 cm) Weight: 76.2 kg (168 lb) IBW/kg (Calculated) : 68.4  Temp (24hrs), Avg:99 F (37.2 C), Min:99 F (37.2 C), Max:99 F (37.2 C)  Recent Labs  Lab 02/20/21 1926  WBC 17.2*  CREATININE 0.68  LATICACIDVEN 2.6*    Estimated Creatinine Clearance: 119.9 mL/min (by C-G formula based on SCr of 0.68 mg/dL).    Allergies  Allergen Reactions   Hydrocodone Itching   Penicillins Other (See Comments)    Unknown childhood allergy Has patient had a PCN reaction causing immediate rash, facial/tongue/throat swelling, SOB or lightheadedness with hypotension: unknown Has patient had a PCN reaction causing severe rash involving mucus membranes or skin necrosis: unknown Has patient had a PCN reaction that required hospitalization: unknown Has patient had a PCN reaction occurring within the last 10 years:no If all of the above answers are "NO", then may proceed with Cephalosporin use.     Antimicrobials this admission: 1/11 cefepime >>   1/11 vanco >>     Thank you for allowing pharmacy to be a part of this patients care.  3/11 BS, PharmD, BCPS Clinical Pharmacist 02/20/2021 8:47 PM

## 2021-02-20 NOTE — ED Notes (Signed)
On assessment pt delusional, paranoid. does inform this RN that he did not ingest nail polish remover, tells this RN he only smelled it. Denies drugs use.

## 2021-02-20 NOTE — H&P (Signed)
History and Physical  [name] [id number] DOB: 03-17-1980 DOA: 02/20/2021  Referring physician: Vanetta Mulders, MD PCP: Benita Stabile, MD  Patient coming from: Home  Chief Complaint: Increased heart rate  HPI: Ronald Hoffman is a 41 y.o. male with medical history significant for seizures, schizophrenia who presents to the emergency department via EMS due to suspicion of ingestion of nail polish.  He denies nail polish ingestion but states that he has been seeing things and hearing things since several days ago.  Patient states that he has bilateral chronic leg pain and the pain has worsened since last 1 month with right being worse than the left, patient states that he was once prescribed Percocet, though he does not like to take the medication.  He denies tobacco and alcohol use.  ED Course:  In the emergency department, he was tachycardic, BP was 156/92, other vital signs were within normal range.  Work-up in the ED showed leukocytosis, thrombocytosis, BMP was normal except for hyperglycemia, lactic acid 2.6 > 2.4, urine drug screen was positive for benzos and amphetamine, troponin x2 was flat at 3, total CK 58, ethanol level was negative, Tylenol level was negative.  Influenza A, B, SARS coronavirus 2 was negative. Chest x-ray showed no acute cardiopulmonary process and a nodular density projecting over the left lower lung, indeterminate. He was empirically started on IV cefepime, Flagyl and vancomycin, IV hydration was provided.  Hospitalist was asked to admit patient for further evaluation and management.  Review of Systems: A full 10 point Review of Systems was done, except as stated above, all other Review of systems were negative.   Past Medical History:  Diagnosis Date   Anxiety    Seizures (HCC)    stress related; no more seizures since first one 10 years ago; on no meds.   Past Surgical History:  Procedure Laterality Date   HEMORRHOID SURGERY N/A 11/12/2015   Procedure:  EXTENSIVE HEMORRHOIDECTOMY;  Surgeon: Franky Macho, MD;  Location: [facility];  Service: General;  Laterality: N/A;    Social History:  reports that he has quit smoking. His smoking use included cigarettes. He has a 5.00 pack-year smoking history. He has never used smokeless tobacco. He reports current alcohol use. He reports current drug use. Drugs: Heroin, Methamphetamines, and Other-see comments.   Allergies  Allergen Reactions   Hydrocodone Itching   Penicillins Other (See Comments)    Unknown childhood allergy Has patient had a PCN reaction causing immediate rash, facial/tongue/throat swelling, SOB or lightheadedness with hypotension: unknown Has patient had a PCN reaction causing severe rash involving mucus membranes or skin necrosis: unknown Has patient had a PCN reaction that required hospitalization: unknown Has patient had a PCN reaction occurring within the last 10 years:no If all of the above answers are "NO", then may proceed with Cephalosporin use.     No family history on file.    Prior to Admission medications   Medication Sig Start Date End Date Taking? Authorizing Provider  citalopram (CELEXA) 20 MG tablet Take 20 mg by mouth daily. 11/14/20   [provider]  diphenhydrAMINE (BENADRYL) 25 MG tablet Take 50 mg by mouth every 6 (six) hours as needed.    [provider]  famotidine (PEPCID) 20 MG tablet Take 1 tablet (20 mg total) by mouth 2 (two) times daily before a meal. 10/31/19   Aldean Baker, NP  gabapentin (NEURONTIN) 400 MG capsule Take 1 capsule (400 mg total) by mouth 3 (three) times  daily. 10/31/19   Aldean Baker, NP  hydrOXYzine (ATARAX/VISTARIL) 25 MG tablet Take 1 tablet (25 mg total) by mouth 3 (three) times daily as needed for anxiety. Patient not taking: Reported on 03/21/2020 10/31/19   Aldean Baker, NP  nicotine (NICODERM CQ - DOSED IN MG/24 HOURS) 21 mg/24hr patch Place 1 patch (21 mg total) onto the skin daily. Patient not taking: No  sig reported 10/31/19   Aldean Baker, NP  OLANZapine (ZYPREXA) 10 MG tablet Take 10 mg by mouth at bedtime. 11/16/19   [provider]  QUEtiapine (SEROQUEL) 100 MG tablet Take 1 tablet (100 mg total) by mouth daily. Patient not taking: Reported on 03/21/2020 10/31/19   Aldean Baker, NP  QUEtiapine (SEROQUEL) 400 MG tablet Take 2 tablets (800 mg total) by mouth at bedtime. Patient not taking: Reported on 03/21/2020 10/31/19   Aldean Baker, NP  sulfamethoxazole-trimethoprim (BACTRIM DS) 800-160 MG tablet Take 1 tablet by mouth 2 (two) times daily. 01/06/21   Bing Neighbors, FNP  traZODone (DESYREL) 50 MG tablet Take 1 tablet (50 mg total) by mouth at bedtime as needed for sleep. 10/31/19   Aldean Baker, NP    Physical Exam: BP (!) 144/82    Pulse (!) 107    Temp 99 F (37.2 C) (Oral)    Resp 16    Ht 5\' 8"  (1.727 m)    Wt 76.2 kg    SpO2 100%    BMI 25.54 kg/m   General: 41 y.o. year-old male well developed well nourished in no acute distress.  Alert and oriented x3. HEENT: NCAT, EOMI Neck: Supple, trachea medial Cardiovascular: Tachycardia.  Regular rate and rhythm with no rubs or gallops.  No thyromegaly or JVD noted.  No lower extremity edema. 2/4 pulses in all 4 extremities. Respiratory: Clear to auscultation with no wheezes or rales. Good inspiratory effort. Abdomen: Soft, nontender nondistended with normal bowel sounds x4 quadrants. Muskuloskeletal: No cyanosis, clubbing or edema noted bilaterally Neuro: CN II-XII intact, strength 5/5 x 4, sensation, reflexes intact Skin: No ulcerative lesions noted or rashes Psychiatry: Mood is appropriate for condition and setting, though patient's thought content was not normal.          Labs on Admission:  Basic Metabolic Panel: Recent Labs  Lab 02/20/21 1926  NA 137  K 3.9  CL 100  CO2 27  GLUCOSE 158*  BUN 8  CREATININE 0.68  CALCIUM 9.4   Liver Function Tests: Recent Labs  Lab 02/20/21 1926  AST 19  ALT 20   ALKPHOS 80  BILITOT 0.3  PROT 7.5  ALBUMIN 4.4   Recent Labs  Lab 02/20/21 1926  LIPASE 28   No results for input(s): AMMONIA in the last 168 hours. CBC: Recent Labs  Lab 02/20/21 1926  WBC 17.2*  NEUTROABS 14.1*  HGB 14.4  HCT 42.0  MCV 85.5  PLT 428*   Cardiac Enzymes: Recent Labs  Lab 02/20/21 1926  CKTOTAL 58    BNP (last 3 results) No results for input(s): BNP in the last 8760 hours.  ProBNP (last 3 results) No results for input(s): PROBNP in the last 8760 hours.  CBG: No results for input(s): GLUCAP in the last 168 hours.  Radiological Exams on Admission: DG Chest Port 1 View  Result Date: 02/20/2021 CLINICAL DATA:  Chest pain. EXAM: PORTABLE CHEST 1 VIEW COMPARISON:  None. FINDINGS: The heart size and mediastinal contours are within normal limits. There is a 5  mm nodular density projecting over the left lower lung. The lungs are otherwise clear. No acute fractures are seen. IMPRESSION: 1. No acute cardiopulmonary process. 2. Nodular density projecting over the left lower lung, indeterminate. Recommend follow-up nonemergent chest CT. Electronically Signed   By: Darliss CheneyAmy  Guttmann M.D.   On: 02/20/2021 20:33    EKG: I independently viewed the EKG done and my findings are as followed: Sinus tachycardia at a rate of 136 bpm  Assessment/Plan Present on Admission:  SIRS (systemic inflammatory response syndrome) (HCC)  Principal Problem:   SIRS (systemic inflammatory response syndrome) (HCC)  SIRS with suspicion for sepsis with unknown source of infection Patient does not appear to be septic Patient was empirically started on IV vancomycin and cefepime due to presumed sepsis secondary to unspecified organism, we shall continue with same at this time with plan to de-escalate/discontinue based on procalcitonin, blood culture and urine culture  Lactic acidosis Lactic acid 2.6 > 2.4, continue IV hydration Continue to trend lactic acid  Drug abuse Urine drug  screen was positive for benzos and amphetamine Patient was counseled on drug abuse cessation  History of schizophrenia Consult was placed to TTS by ED physician, we shall await further recommendation  DVT prophylaxis: Lovenox  Code Status: Full code  Family Communication: None at bedside  Disposition Plan:  Patient is from:                        home Anticipated DC to:                   SNF or family members home Anticipated DC date:               2-3 days Anticipated DC barriers:         Patient requires inpatient management due to presumed sepsis   Consults called: None  Admission status: Observation    Frankey Shownladapo Sahithi Ordoyne MD [facility]  02/20/2021, 11:03 PM

## 2021-02-21 ENCOUNTER — Encounter (HOSPITAL_COMMUNITY): Payer: Self-pay | Admitting: Internal Medicine

## 2021-02-21 ENCOUNTER — Observation Stay (HOSPITAL_COMMUNITY): Payer: Self-pay

## 2021-02-21 ENCOUNTER — Other Ambulatory Visit: Payer: Self-pay

## 2021-02-21 DIAGNOSIS — R079 Chest pain, unspecified: Secondary | ICD-10-CM

## 2021-02-21 DIAGNOSIS — R000 Tachycardia, unspecified: Principal | ICD-10-CM

## 2021-02-21 DIAGNOSIS — E872 Acidosis, unspecified: Secondary | ICD-10-CM

## 2021-02-21 DIAGNOSIS — A419 Sepsis, unspecified organism: Secondary | ICD-10-CM

## 2021-02-21 DIAGNOSIS — F29 Unspecified psychosis not due to a substance or known physiological condition: Secondary | ICD-10-CM

## 2021-02-21 HISTORY — DX: Acidosis, unspecified: E87.20

## 2021-02-21 LAB — MAGNESIUM: Magnesium: 2.2 mg/dL (ref 1.7–2.4)

## 2021-02-21 LAB — COMPREHENSIVE METABOLIC PANEL
ALT: 16 U/L (ref 0–44)
AST: 15 U/L (ref 15–41)
Albumin: 4 g/dL (ref 3.5–5.0)
Alkaline Phosphatase: 74 U/L (ref 38–126)
Anion gap: 10 (ref 5–15)
BUN: 7 mg/dL (ref 6–20)
CO2: 25 mmol/L (ref 22–32)
Calcium: 9.8 mg/dL (ref 8.9–10.3)
Chloride: 103 mmol/L (ref 98–111)
Creatinine, Ser: 0.59 mg/dL — ABNORMAL LOW (ref 0.61–1.24)
GFR, Estimated: >60 mL/min (ref >60)
Glucose, Bld: 109 mg/dL — ABNORMAL HIGH (ref 70–99)
Potassium: 3.6 mmol/L (ref 3.5–5.1)
Sodium: 138 mmol/L (ref 135–145)
Total Bilirubin: 0.8 mg/dL (ref 0.3–1.2)
Total Protein: 6.9 g/dL (ref 6.5–8.1)

## 2021-02-21 LAB — ECHOCARDIOGRAM COMPLETE
AR max vel: 3.47 cm2
AV Area VTI: 3.33 cm2
AV Area mean vel: 2.94 cm2
AV Mean grad: 3 mmHg
AV Peak grad: 6.5 mmHg
Ao pk vel: 1.27 m/s
Area-P 1/2: 5.93 cm2
Calc EF: 48.6 %
Height: 68 in
MV VTI: 4.37 cm2
S' Lateral: 3 cm
Single Plane A2C EF: 49.1 %
Single Plane A4C EF: 50.6 %
Weight: 2340.8 oz

## 2021-02-21 LAB — CBC
HCT: 38.5 % — ABNORMAL LOW (ref 39.0–52.0)
Hemoglobin: 12.9 g/dL — ABNORMAL LOW (ref 13.0–17.0)
MCH: 28.2 pg (ref 26.0–34.0)
MCHC: 33.5 g/dL (ref 30.0–36.0)
MCV: 84.1 fL (ref 80.0–100.0)
Platelets: 347 10*3/uL (ref 150–400)
RBC: 4.58 MIL/uL (ref 4.22–5.81)
RDW: 12.4 % (ref 11.5–15.5)
WBC: 10.7 10*3/uL — ABNORMAL HIGH (ref 4.0–10.5)
nRBC: 0 % (ref 0.0–0.2)

## 2021-02-21 LAB — HIV ANTIBODY (ROUTINE TESTING W REFLEX): HIV Screen 4th Generation wRfx: NONREACTIVE

## 2021-02-21 LAB — LACTIC ACID, PLASMA: Lactic Acid, Venous: 0.8 mmol/L (ref 0.5–1.9)

## 2021-02-21 LAB — VITAMIN B12: Vitamin B-12: 330 pg/mL (ref 180–914)

## 2021-02-21 LAB — PHOSPHORUS: Phosphorus: 3.4 mg/dL (ref 2.5–4.6)

## 2021-02-21 LAB — TSH: TSH: 0.25 u[IU]/mL — ABNORMAL LOW (ref 0.350–4.500)

## 2021-02-21 LAB — D-DIMER, QUANTITATIVE: D-Dimer, Quant: 0.43 ug/mL-FEU (ref 0.00–0.50)

## 2021-02-21 LAB — T4, FREE: Free T4: 1.36 ng/dL — ABNORMAL HIGH (ref 0.61–1.12)

## 2021-02-21 LAB — PROCALCITONIN: Procalcitonin: <0.1 ng/mL

## 2021-02-21 LAB — FOLATE: Folate: 15 ng/mL (ref >5.9)

## 2021-02-21 MED ORDER — ACETAMINOPHEN 325 MG PO TABS
650.0000 mg | ORAL_TABLET | Freq: Four times a day (QID) | ORAL | Status: DC | PRN
  Administered 2021-02-21 – 2021-02-24 (×5): 650 mg via ORAL
  Filled 2021-02-21 (×5): qty 2

## 2021-02-21 MED ORDER — ENOXAPARIN SODIUM 40 MG/0.4ML IJ SOSY
40.0000 mg | PREFILLED_SYRINGE | INTRAMUSCULAR | Status: DC
  Administered 2021-02-21 – 2021-02-25 (×5): 40 mg via SUBCUTANEOUS
  Filled 2021-02-21 (×5): qty 0.4

## 2021-02-21 MED ORDER — NICOTINE 21 MG/24HR TD PT24
21.0000 mg | MEDICATED_PATCH | Freq: Every day | TRANSDERMAL | Status: DC
  Administered 2021-02-21 – 2021-02-25 (×6): 21 mg via TRANSDERMAL
  Filled 2021-02-21 (×6): qty 1

## 2021-02-21 NOTE — Progress Notes (Incomplete)
*  PRELIMINARY RESULTS* Echocardiogram 2D Echocardiogram has been performed.  Ronald Hoffman 02/21/2021, 10:37 AM

## 2021-02-21 NOTE — Progress Notes (Signed)
This nurse is taking over care of patent at this time. Introduced myself to patient. Pt has family and sitter at bedside. Does not seem agitated at this time but is still confused.  Pt has no current needs at this time.

## 2021-02-21 NOTE — Plan of Care (Signed)
Patient admitted for possible SIRS and psychosis. Patient is alert and oriented to self, with some confusion. Skin is intake, walks independently. Sitter at the bedside.

## 2021-02-21 NOTE — Plan of Care (Signed)
Pt sleeping at this time, periods of insomnia.

## 2021-02-21 NOTE — BH Assessment (Signed)
Consulted EDP, Dr. Arbutus Leas. He stated he " I will reconsult when he is medically stable."  Johnwilliam Shepperson T. Jimmye Norman, MS, [name], [facility] Triage Specialist [facility]

## 2021-02-21 NOTE — BH Assessment (Signed)
Clinician reviewed pt's chart in preparation to complete pt's MH Assessment. At this time, 1927, it appears pt is still not medically cleared and is medically admitted. TTS to attempt assessment at a later time.

## 2021-02-21 NOTE — Progress Notes (Signed)
PROGRESS NOTE  Ronald Hoffman [id number] DOB: 12-06-1980 DOA: 02/20/2021 PCP: Celene Squibb, MD  Brief History:  41 year old male with history of delusional disorder, schizophrenia, and seizure disorder presenting by EMS for potential ingestion of foreign substance.  Apparently the patient's family was unsure if the patient actually ingested any nail polish.  The patient himself denies any foreign substance ingestion.  He denies any suicidal or homicidal ideations.  He is a poor historian.  He states that he lives with his parents.  Apparently, the patient has been complaining of substernal chest pain and epigastric pain for the last 5 months that has been constant.  He states that his pain worsens when he "goes faster".  He denies any frank worsening shortness of breath, coughing, hemoptysis.  He denies any fevers, chills, headache, neck pain.  He has had some intermittent nausea and vomiting.  He states that he vomited 8 times on 02/19/2021, but has not had any emesis since arrival to the hospital.  Lastly the patient has been complaining of leg pain which is been present constantly for the last 5 months.  He states that it is worse when he is just resting.  He denies any worsening leg edema or recent injuries. Lastly, the patient states that he has been hearing voices from his uncle telling him to do things.  He has also been seeing some things on the wall but unable to clarify that. ED Patient had a low-grade temperature of 99.0 F.  He was tachycardic up into the 140s.  He was hemodynamically stable with oxygen saturation 100% on room air.  BMP showed sodium 138, potassium 3.6, CO2 25, serum creatinine 0.59.  LFTs unremarkable.  Lipase 28.  WBC 17.2, hemoglobin 14.1, platelets 420,000.  Lactic acid 2.6>>2.4.  Chest x-ray was negative for edema or consolidation but there was left lower lobe nodular indeterminate density.  VBG showed 7.36/47/46/24  Assessment/Plan: SIRS -Presented with  tachycardia and leukocytosis -UA negative for pyuria -Chest x-ray negative for consolidation -Check PCT -Continue empiric antibiotics  Lactic acidosis -Continue to trend  Hallucinations -Review of the medical record shows the patient has had delusions in the past and was previously on Zyprexa, Seroquel, and Cogentin -Currently not on any medications -TTS consult  Leg pain -Venous duplex  Chest pain/Sinus Tachycardia -Troponins negative -Echocardiogram -D-dimer -TSH -continue IVF  Positive amphetamine drug screen -Patient gives a illicit history of but states that he may have done to be drugs of weight prior to this admission -This certainly may account for the patient's tachycardia   Abdominal pain -CT abd/pelvis       Family Communication:  no Family at bedside  Consultants:  TTS  Code Status:  FULL   DVT Prophylaxis:  [place] Lovenox   Procedures: As Listed in Progress Note Above  Antibiotics: Vanc 1/11>> Cefepime 1/11>>       Subjective: He complains of cp, leg pain.  Denies n/v/d.  Has periumbilical pain.    Objective: Vitals:   02/20/21 2200 02/20/21 2254 02/21/21 0050 02/21/21 0516  BP: (!) 156/88 (!) 144/82 (!) 152/95 (!) 139/96  Pulse: (!) 110 (!) 107 95 (!) 108  Resp: 16  20 20   Temp:   98.6 F (37 C) 97.9 F (36.6 C)  TempSrc:   Oral Oral  SpO2: 99% 100% 99% 100%  Weight:   66.4 kg   Height:   5\' 8"  (1.727 m)  Intake/Output Summary (Last 24 hours) at 02/21/2021 0654 Last data filed at 02/21/2021 0529 Gross per 24 hour  Intake 1343.92 ml  Output --  Net 1343.92 ml   Weight change:  Exam:  General:  Pt is alert, follows commands appropriately, not in acute distress HEENT: No icterus, No thrush, No neck mass, [place]/AT Cardiovascular: RRR, S1/S2, no rubs, no gallops Respiratory: CTA bilaterally, no wheezing, no crackles, no rhonchi Abdomen: Soft/+BS, periumbilical tender, non distended, no guarding Extremities: No edema, No  lymphangitis, No petechiae, No rashes, no synovitis   Data Reviewed: I have personally reviewed following labs and imaging studies Basic Metabolic Panel: Recent Labs  Lab 02/20/21 1926 02/21/21 0442  NA 137 138  K 3.9 3.6  CL 100 103  CO2 27 25  GLUCOSE 158* 109*  BUN 8 7  CREATININE 0.68 0.59*  CALCIUM 9.4 9.8  MG  --  2.2  PHOS  --  3.4   Liver Function Tests: Recent Labs  Lab 02/20/21 1926 02/21/21 0442  AST 19 15  ALT 20 16  ALKPHOS 80 74  BILITOT 0.3 0.8  PROT 7.5 6.9  ALBUMIN 4.4 4.0   Recent Labs  Lab 02/20/21 1926  LIPASE 28   No results for input(s): AMMONIA in the last 168 hours. Coagulation Profile: Recent Labs  Lab 02/20/21 1926  INR 1.0   CBC: Recent Labs  Lab 02/20/21 1926 02/21/21 0442  WBC 17.2* 10.7*  NEUTROABS 14.1*  --   HGB 14.4 12.9*  HCT 42.0 38.5*  MCV 85.5 84.1  PLT 428* 347   Cardiac Enzymes: Recent Labs  Lab 02/20/21 1926  CKTOTAL 58   BNP: Invalid input(s): POCBNP CBG: No results for input(s): GLUCAP in the last 168 hours. HbA1C: No results for input(s): HGBA1C in the last 72 hours. Urine analysis:    Component Value Date/Time   COLORURINE YELLOW 02/20/2021 2049   APPEARANCEUR CLOUDY (A) 02/20/2021 2049   LABSPEC 1.017 02/20/2021 2049   PHURINE 7.0 02/20/2021 2049   GLUCOSEU NEGATIVE 02/20/2021 2049   HGBUR NEGATIVE 02/20/2021 2049   BILIRUBINUR NEGATIVE 02/20/2021 2049   [place] 02/20/2021 2049   PROTEINUR 30 (A) 02/20/2021 2049   NITRITE NEGATIVE 02/20/2021 2049   LEUKOCYTESUR NEGATIVE 02/20/2021 2049   Sepsis Labs: @LABRCNTIP (procalcitonin:4,lacticidven:4) ) Recent Results (from the past 240 hour(s))  Blood Culture (routine x 2)     Status: None (Preliminary result)   Collection Time: 02/20/21  9:07 PM   Specimen: Right Antecubital; Blood  Result Value Ref Range Status   Specimen Description RIGHT ANTECUBITAL  Final   Special Requests   Final    BOTTLES DRAWN AEROBIC AND ANAEROBIC  Blood Culture results may not be optimal due to an excessive volume of blood received in culture bottles Performed at [facility], [address] [place], [place] [address]    Culture PENDING  Incomplete   Report Status PENDING  Incomplete  Blood Culture (routine x 2)     Status: None (Preliminary result)   Collection Time: 02/20/21  9:07 PM   Specimen: BLOOD RIGHT HAND  Result Value Ref Range Status   Specimen Description BLOOD RIGHT HAND  Final   Special Requests   Final    BOTTLES DRAWN AEROBIC ONLY Blood Culture results may not be optimal due to an inadequate volume of blood received in culture bottles Performed at [facility], [address] [place],  [address]    Culture PENDING  Incomplete   Report Status PENDING  Incomplete  Resp Panel by RT-PCR (Flu A&B, Covid) Nasopharyngeal Swab     Status: None   Collection Time: 02/20/21  9:31 PM   Specimen: Nasopharyngeal Swab; Nasopharyngeal(NP) swabs in vial transport medium  Result Value Ref Range Status   SARS Coronavirus 2 by RT PCR NEGATIVE NEGATIVE Final    Comment: (NOTE) SARS-CoV-2 target nucleic acids are NOT DETECTED.  The SARS-CoV-2 RNA is generally detectable in upper respiratory specimens during the acute phase of infection. The lowest concentration of SARS-CoV-2 viral copies this assay can detect is 138 copies/mL. A negative result does not preclude SARS-Cov-2 infection and should not be used as the sole basis for treatment or other patient management decisions. A negative result may occur with  improper specimen collection/handling, submission of specimen other than nasopharyngeal swab, presence of viral mutation(s) within the areas targeted by this assay, and inadequate number of viral copies(<138 copies/mL). A negative result must be combined with clinical observations, patient history, and epidemiological information. The expected result is Negative.  Fact Sheet for Patients:   EntrepreneurPulse.com.au  Fact Sheet for Healthcare Providers:  IncredibleEmployment.be  This test is no t yet approved or cleared by the [place] FDA and  has been authorized for detection and/or diagnosis of SARS-CoV-2 by FDA under an Emergency Use Authorization (EUA). This EUA will remain  in effect (meaning this test can be used) for the duration of the COVID-19 declaration under Section 564(b)(1) of the Act, 21 U.S.C.section 360bbb-3(b)(1), unless the authorization is terminated  or revoked sooner.       Influenza A by PCR NEGATIVE NEGATIVE Final   Influenza B by PCR NEGATIVE NEGATIVE Final    Comment: (NOTE) The Xpert Xpress SARS-CoV-2/FLU/RSV plus assay is intended as an aid in the diagnosis of influenza from Nasopharyngeal swab specimens and should not be used as a sole basis for treatment. Nasal washings and aspirates are unacceptable for Xpert Xpress SARS-CoV-2/FLU/RSV testing.  Fact Sheet for Patients: EntrepreneurPulse.com.au  Fact Sheet for Healthcare Providers: IncredibleEmployment.be  This test is not yet approved or cleared by the [place] FDA and has been authorized for detection and/or diagnosis of SARS-CoV-2 by FDA under an Emergency Use Authorization (EUA). This EUA will remain in effect (meaning this test can be used) for the duration of the COVID-19 declaration under Section 564(b)(1) of the Act, 21 U.S.C. section 360bbb-3(b)(1), unless the authorization is terminated or revoked.  Performed at [facility], [address] [place], [place] [address]      Scheduled Meds:  enoxaparin (LOVENOX) injection  40 mg Subcutaneous Q24H   Continuous Infusions:  ceFEPime (MAXIPIME) IV 2 g (02/21/21 0529)   lactated ringers 150 mL/hr at 02/20/21 2334   vancomycin 1,750 mg (02/20/21 2333)    Procedures/Studies: DG Chest Port 1 View  Result Date: 02/20/2021 CLINICAL DATA:   Chest pain. EXAM: PORTABLE CHEST 1 VIEW COMPARISON:  None. FINDINGS: The heart size and mediastinal contours are within normal limits. There is a 5 mm nodular density projecting over the left lower lung. The lungs are otherwise clear. No acute fractures are seen. IMPRESSION: 1. No acute cardiopulmonary process. 2. Nodular density projecting over the left lower lung, indeterminate. Recommend follow-up nonemergent chest CT. Electronically Signed   By: Ronney Asters M.D.   On: 02/20/2021 20:33    Orson Eva, DO  [facility]  If 7PM-7AM, please contact night-coverage [email or web address] Password [email or web address] 02/21/2021, 6:54 AM   LOS: 0 days

## 2021-02-21 NOTE — Progress Notes (Signed)
Pt laying in bed at this time resting, pt has been compliant and follows directions. Pt reoriented as needed. Confused and disoriented most of the shift.

## 2021-02-22 ENCOUNTER — Inpatient Hospital Stay (HOSPITAL_COMMUNITY): Payer: Self-pay

## 2021-02-22 LAB — HEPATIC FUNCTION PANEL
ALT: 15 U/L (ref 0–44)
AST: 13 U/L — ABNORMAL LOW (ref 15–41)
Albumin: 3.5 g/dL (ref 3.5–5.0)
Alkaline Phosphatase: 66 U/L (ref 38–126)
Bilirubin, Direct: 0.1 mg/dL (ref 0.0–0.2)
Indirect Bilirubin: 0.7 mg/dL (ref 0.3–0.9)
Total Bilirubin: 0.8 mg/dL (ref 0.3–1.2)
Total Protein: 6.3 g/dL — ABNORMAL LOW (ref 6.5–8.1)

## 2021-02-22 LAB — CBC
HCT: 39.5 % (ref 39.0–52.0)
Hemoglobin: 13.6 g/dL (ref 13.0–17.0)
MCH: 29.3 pg (ref 26.0–34.0)
MCHC: 34.4 g/dL (ref 30.0–36.0)
MCV: 85.1 fL (ref 80.0–100.0)
Platelets: 322 10*3/uL (ref 150–400)
RBC: 4.64 MIL/uL (ref 4.22–5.81)
RDW: 12.7 % (ref 11.5–15.5)
WBC: 8.9 10*3/uL (ref 4.0–10.5)
nRBC: 0 % (ref 0.0–0.2)

## 2021-02-22 LAB — BASIC METABOLIC PANEL
Anion gap: 5 (ref 5–15)
BUN: 11 mg/dL (ref 6–20)
CO2: 21 mmol/L — ABNORMAL LOW (ref 22–32)
Calcium: 8.5 mg/dL — ABNORMAL LOW (ref 8.9–10.3)
Chloride: 110 mmol/L (ref 98–111)
Creatinine, Ser: 0.7 mg/dL (ref 0.61–1.24)
GFR, Estimated: >60 mL/min (ref >60)
Glucose, Bld: 115 mg/dL — ABNORMAL HIGH (ref 70–99)
Potassium: 3.3 mmol/L — ABNORMAL LOW (ref 3.5–5.1)
Sodium: 136 mmol/L (ref 135–145)

## 2021-02-22 LAB — MAGNESIUM: Magnesium: 2.1 mg/dL (ref 1.7–2.4)

## 2021-02-22 LAB — URINE CULTURE: Culture: <10000 — AB

## 2021-02-22 LAB — AMMONIA: Ammonia: 23 umol/L (ref 9–35)

## 2021-02-22 MED ORDER — CITALOPRAM HYDROBROMIDE 20 MG PO TABS
20.0000 mg | ORAL_TABLET | Freq: Every day | ORAL | Status: DC
  Administered 2021-02-22 – 2021-02-25 (×4): 20 mg via ORAL
  Filled 2021-02-22 (×4): qty 1

## 2021-02-22 MED ORDER — ONDANSETRON HCL 4 MG/2ML IJ SOLN: 4.0000 mg | Freq: Four times a day (QID) | INTRAMUSCULAR | Status: DC | PRN

## 2021-02-22 MED ORDER — QUETIAPINE FUMARATE 25 MG PO TABS
50.0000 mg | ORAL_TABLET | Freq: Two times a day (BID) | ORAL | Status: DC
  Administered 2021-02-22 – 2021-02-23 (×3): 50 mg via ORAL
  Filled 2021-02-22 (×5): qty 2

## 2021-02-22 MED ORDER — IOHEXOL 9 MG/ML PO SOLN
ORAL | Status: AC
  Administered 2021-02-22: 500 mL
  Filled 2021-02-22: qty 1000

## 2021-02-22 MED ORDER — SODIUM CHLORIDE 0.9 % IV BOLUS
1000.0000 mL | Freq: Once | INTRAVENOUS | Status: AC
Start: 2021-02-22 — End: 2021-02-22
  Administered 2021-02-22: 1000 mL via INTRAVENOUS

## 2021-02-22 MED ORDER — QUETIAPINE FUMARATE 25 MG PO TABS: 50.0000 mg | ORAL_TABLET | Freq: Once | ORAL | Status: AC

## 2021-02-22 MED ORDER — IOHEXOL 300 MG/ML  SOLN
100.0000 mL | Freq: Once | INTRAMUSCULAR | Status: AC | PRN
  Administered 2021-02-22: 100 mL via INTRAVENOUS

## 2021-02-22 MED ORDER — OLANZAPINE 5 MG PO TABS
10.0000 mg | ORAL_TABLET | Freq: Every day | ORAL | Status: DC
  Administered 2021-02-22 – 2021-02-24 (×3): 10 mg via ORAL
  Filled 2021-02-22 (×3): qty 2

## 2021-02-22 MED ORDER — METOPROLOL TARTRATE 25 MG PO TABS
25.0000 mg | ORAL_TABLET | Freq: Two times a day (BID) | ORAL | Status: DC
  Administered 2021-02-22 – 2021-02-23 (×3): 25 mg via ORAL
  Filled 2021-02-22 (×3): qty 1

## 2021-02-22 MED ORDER — HALOPERIDOL LACTATE 5 MG/ML IJ SOLN
5.0000 mg | Freq: Four times a day (QID) | INTRAMUSCULAR | Status: DC | PRN
  Administered 2021-02-22: 5 mg via INTRAVENOUS
  Filled 2021-02-22: qty 1

## 2021-02-22 MED ORDER — QUETIAPINE FUMARATE 100 MG PO TABS
100.0000 mg | ORAL_TABLET | Freq: Every day | ORAL | Status: DC
  Administered 2021-02-23 – 2021-02-25 (×3): 100 mg via ORAL
  Filled 2021-02-22 (×3): qty 1

## 2021-02-22 MED ORDER — GABAPENTIN 400 MG PO CAPS
400.0000 mg | ORAL_CAPSULE | Freq: Three times a day (TID) | ORAL | Status: DC
  Administered 2021-02-22 – 2021-02-25 (×10): 400 mg via ORAL
  Filled 2021-02-22 (×10): qty 1

## 2021-02-22 MED ORDER — HYDROXYZINE HCL 25 MG PO TABS
25.0000 mg | ORAL_TABLET | Freq: Once | ORAL | Status: AC
  Administered 2021-02-22: 25 mg via ORAL
  Filled 2021-02-22: qty 1

## 2021-02-22 MED ORDER — HYDROXYZINE HCL 25 MG PO TABS
25.0000 mg | ORAL_TABLET | Freq: Once | ORAL | Status: DC
  Filled 2021-02-22: qty 1

## 2021-02-22 MED ORDER — POTASSIUM CHLORIDE CRYS ER 20 MEQ PO TBCR
40.0000 meq | EXTENDED_RELEASE_TABLET | Freq: Once | ORAL | Status: AC
  Administered 2021-02-22: 40 meq via ORAL
  Filled 2021-02-22: qty 2

## 2021-02-22 NOTE — Progress Notes (Signed)
Pt alert this shift, minimal outbursts this am and some new onset tremors. MD aware. Pt ordered PRN Haldol for anxiousness and restless feeling. Pt still only alert self, has grandiose thinking at times. Parents came to visit and mentioned pt being incarcerated for assault on an officer due to pt being at the bank and thinking he had $2.7 million dollars. Pt started this behavior approximately 3 years ago, and admitted to hearing voices and multiple personalities in which he referred to himself to per parents. Pt awaiting to be medically cleared for psychiatric eval and possible placement per mother [facility]

## 2021-02-22 NOTE — Progress Notes (Signed)
PROGRESS NOTE  Ronald Hoffman [id number] DOB: May 02, 1980 DOA: 02/20/2021 PCP: Celene Squibb, MD  Brief History:  41 year old male with history of delusional disorder, schizophrenia, and seizure disorder presenting by EMS for potential ingestion of foreign substance.  Apparently the patient's family was unsure if the patient actually ingested any nail polish.  The patient himself denies any foreign substance ingestion.  He denies any suicidal or homicidal ideations.  He is a poor historian.  He states that he lives with his parents.  Apparently, the patient has been complaining of substernal chest pain and epigastric pain for the last 5 months that has been constant.  He states that his pain worsens when he "goes faster".  He denies any frank worsening shortness of breath, coughing, hemoptysis.  He denies any fevers, chills, headache, neck pain.  He has had some intermittent nausea and vomiting.  He states that he vomited 8 times on 02/19/2021, but has not had any emesis since arrival to the hospital.  Lastly the patient has been complaining of leg pain which is been present constantly for the last 5 months.  He states that it is worse when he is just resting.  He denies any worsening leg edema or recent injuries. Lastly, the patient states that he has been hearing voices from his uncle telling him to do things.  He has also been seeing some things on the wall but unable to clarify that. ED Patient had a low-grade temperature of 99.0 F.  He was tachycardic up into the 140s.  He was hemodynamically stable with oxygen saturation 100% on room air.  BMP showed sodium 138, potassium 3.6, CO2 25, serum creatinine 0.59.  LFTs unremarkable.  Lipase 28.  WBC 17.2, hemoglobin 14.1, platelets 420,000.  Lactic acid 2.6>>2.4.  Chest x-ray was negative for edema or consolidation but there was left lower lobe nodular indeterminate density.  VBG showed 7.36/47/46/24   Assessment/Plan: SIRS -Presented with  tachycardia and leukocytosis -UA negative for pyuria -Chest x-ray negative for consolidation -Check PCT <0.10 -Continue empiric antibiotics intially -1/13--d/c vanc and cefepime -blood cultures neg   Lactic acidosis -improved with IVF   Hallucinations -Review of the medical record shows the patient has had delusions in the past and was previously on Zyprexa, Seroquel, and Cogentin -Currently not on any medications -TTS consult   Leg pain -Venous duplex--neg DVT   Chest pain/Sinus Tachycardia -Troponins negative -Echocardiogram--EF 50-55%, no WMA, normal RV -D-dimer--0.43 -TSH--0.259 -continue IVF   Positive amphetamine drug screen -Patient gives a illicit history of but states that he may have done to be drugs of weight prior to this admission -This certainly may account for the patient's tachycardia   Abdominal pain -CT abd/pelvis -lipase 28  Abnormal Thyroid functions -TSH 0.250 -Free T4--1.36 -would repeat TFTs in 4 weeks             Family Communication:  no Family at bedside   Consultants:  TTS   Code Status:  FULL    DVT Prophylaxis:  [place] Lovenox     Procedures: As Listed in Progress Note Above   Antibiotics: Vanc 1/11>> Cefepime 1/11>>      Subjective: Patient complains of upper abd pain.  Denies n/v/d.  Denies cp, sob, fc  Objective: Vitals:   02/21/21 0516 02/21/21 1253 02/21/21 2114 02/22/21 0447  BP: (!) 139/96 (!) 140/94 (!) 133/95 123/85  Pulse: (!) 108 (!) 106 (!) 105 (!) 101  Resp:  20 18 20 20   Temp: 97.9 F (36.6 C) 98.7 F (37.1 C) 98 F (36.7 C) 97.7 F (36.5 C)  TempSrc: Oral Oral Oral   SpO2: 100% 100% 100% 99%  Weight:      Height:        Intake/Output Summary (Last 24 hours) at 02/22/2021 0818 Last data filed at 02/22/2021 0023 Gross per 24 hour  Intake 1087.07 ml  Output --  Net 1087.07 ml   Weight change:  Exam:  General:  Pt is alert, follows commands appropriately, not in acute distress HEENT: No  icterus, No thrush, No neck mass, [place]/AT Cardiovascular: RRR, S1/S2, no rubs, no gallops Respiratory: CTA bilaterally, no wheezing, no crackles, no rhonchi Abdomen: Soft/+BS, periumbilical tender, non distended, no guarding Extremities: No edema, No lymphangitis, No petechiae, No rashes, no synovitis   Data Reviewed: I have personally reviewed following labs and imaging studies Basic Metabolic Panel: Recent Labs  Lab 02/20/21 1926 02/21/21 0442  NA 137 138  K 3.9 3.6  CL 100 103  CO2 27 25  GLUCOSE 158* 109*  BUN 8 7  CREATININE 0.68 0.59*  CALCIUM 9.4 9.8  MG  --  2.2  PHOS  --  3.4   Liver Function Tests: Recent Labs  Lab 02/20/21 1926 02/21/21 0442  AST 19 15  ALT 20 16  ALKPHOS 80 74  BILITOT 0.3 0.8  PROT 7.5 6.9  ALBUMIN 4.4 4.0   Recent Labs  Lab 02/20/21 1926  LIPASE 28   No results for input(s): AMMONIA in the last 168 hours. Coagulation Profile: Recent Labs  Lab 02/20/21 1926  INR 1.0   CBC: Recent Labs  Lab 02/20/21 1926 02/21/21 0442  WBC 17.2* 10.7*  NEUTROABS 14.1*  --   HGB 14.4 12.9*  HCT 42.0 38.5*  MCV 85.5 84.1  PLT 428* 347   Cardiac Enzymes: Recent Labs  Lab 02/20/21 1926  CKTOTAL 58   BNP: Invalid input(s): POCBNP CBG: No results for input(s): GLUCAP in the last 168 hours. HbA1C: No results for input(s): HGBA1C in the last 72 hours. Urine analysis:    Component Value Date/Time   COLORURINE YELLOW 02/20/2021 2049   APPEARANCEUR CLOUDY (A) 02/20/2021 2049   LABSPEC 1.017 02/20/2021 2049   PHURINE 7.0 02/20/2021 2049   GLUCOSEU NEGATIVE 02/20/2021 2049   HGBUR NEGATIVE 02/20/2021 2049   BILIRUBINUR NEGATIVE 02/20/2021 2049   [place] 02/20/2021 2049   PROTEINUR 30 (A) 02/20/2021 2049   NITRITE NEGATIVE 02/20/2021 2049   LEUKOCYTESUR NEGATIVE 02/20/2021 2049   Sepsis Labs: @LABRCNTIP (procalcitonin:4,lacticidven:4) ) Recent Results (from the past 240 hour(s))  Blood Culture (routine x 2)     Status:  None (Preliminary result)   Collection Time: 02/20/21  9:07 PM   Specimen: Right Antecubital; Blood  Result Value Ref Range Status   Specimen Description RIGHT ANTECUBITAL  Final   Special Requests   Final    BOTTLES DRAWN AEROBIC AND ANAEROBIC Blood Culture results may not be optimal due to an excessive volume of blood received in culture bottles   Culture   Final    NO GROWTH 2 DAYS Performed at [facility], [address], [place], [place] [address]    Report Status PENDING  Incomplete  Blood Culture (routine x 2)     Status: None (Preliminary result)   Collection Time: 02/20/21  9:07 PM   Specimen: BLOOD RIGHT HAND  Result Value Ref Range Status   Specimen Description BLOOD RIGHT HAND  Final  Special Requests   Final    BOTTLES DRAWN AEROBIC ONLY Blood Culture results may not be optimal due to an inadequate volume of blood received in culture bottles   Culture   Final    NO GROWTH 2 DAYS Performed at [facility], [address], [place], [place] [address]    Report Status PENDING  Incomplete  Resp Panel by RT-PCR (Flu A&B, Covid) Nasopharyngeal Swab     Status: None   Collection Time: 02/20/21  9:31 PM   Specimen: Nasopharyngeal Swab; Nasopharyngeal(NP) swabs in vial transport medium  Result Value Ref Range Status   SARS Coronavirus 2 by RT PCR NEGATIVE NEGATIVE Final    Comment: (NOTE) SARS-CoV-2 target nucleic acids are NOT DETECTED.  The SARS-CoV-2 RNA is generally detectable in upper respiratory specimens during the acute phase of infection. The lowest concentration of SARS-CoV-2 viral copies this assay can detect is 138 copies/mL. A negative result does not preclude SARS-Cov-2 infection and should not be used as the sole basis for treatment or other patient management decisions. A negative result may occur with  improper specimen collection/handling, submission of specimen other than nasopharyngeal swab, presence of viral mutation(s) within the areas targeted by this  assay, and inadequate number of viral copies(<138 copies/mL). A negative result must be combined with clinical observations, patient history, and epidemiological information. The expected result is Negative.  Fact Sheet for Patients:  EntrepreneurPulse.com.au  Fact Sheet for Healthcare Providers:  IncredibleEmployment.be  This test is no t yet approved or cleared by the [place] FDA and  has been authorized for detection and/or diagnosis of SARS-CoV-2 by FDA under an Emergency Use Authorization (EUA). This EUA will remain  in effect (meaning this test can be used) for the duration of the COVID-19 declaration under Section 564(b)(1) of the Act, 21 U.S.C.section 360bbb-3(b)(1), unless the authorization is terminated  or revoked sooner.       Influenza A by PCR NEGATIVE NEGATIVE Final   Influenza B by PCR NEGATIVE NEGATIVE Final    Comment: (NOTE) The Xpert Xpress SARS-CoV-2/FLU/RSV plus assay is intended as an aid in the diagnosis of influenza from Nasopharyngeal swab specimens and should not be used as a sole basis for treatment. Nasal washings and aspirates are unacceptable for Xpert Xpress SARS-CoV-2/FLU/RSV testing.  Fact Sheet for Patients: EntrepreneurPulse.com.au  Fact Sheet for Healthcare Providers: IncredibleEmployment.be  This test is not yet approved or cleared by the [place] FDA and has been authorized for detection and/or diagnosis of SARS-CoV-2 by FDA under an Emergency Use Authorization (EUA). This EUA will remain in effect (meaning this test can be used) for the duration of the COVID-19 declaration under Section 564(b)(1) of the Act, 21 U.S.C. section 360bbb-3(b)(1), unless the authorization is terminated or revoked.  Performed at [facility], [address] [place],  [address]      Scheduled Meds:  enoxaparin (LOVENOX) injection  40 mg Subcutaneous Q24H    hydrOXYzine  25 mg Oral Once   nicotine  21 mg Transdermal Daily   QUEtiapine  50 mg Oral BID   Continuous Infusions:  Procedures/Studies: US Venous Img Lower Bilateral (DVT)  Result Date: 02/21/2021 CLINICAL DATA:  Bilateral lower extremity pain. EXAM: BILATERAL LOWER EXTREMITY VENOUS DOPPLER ULTRASOUND TECHNIQUE: Gray-scale sonography with graded compression, as well as color Doppler and duplex ultrasound were performed to evaluate the lower extremity deep venous systems from the level of the common femoral vein and including the common femoral, femoral, profunda femoral, popliteal and calf veins  including the posterior tibial, peroneal and gastrocnemius veins when visible. The superficial great saphenous vein was also interrogated. Spectral Doppler was utilized to evaluate flow at rest and with distal augmentation maneuvers in the common femoral, femoral and popliteal veins. COMPARISON:  Chest XR, concurrent. FINDINGS: RIGHT LOWER EXTREMITY VENOUS Normal compressibility of the RIGHT common femoral, superficial femoral, and popliteal veins, as well as the visualized calf veins. Visualized portions of profunda femoral vein and great saphenous vein unremarkable. No filling defects to suggest DVT on grayscale or color Doppler imaging. Doppler waveforms show normal direction of venous flow, normal respiratory plasticity and response to augmentation. OTHER No evidence of superficial thrombophlebitis or abnormal fluid collection. Limitations: none LEFT LOWER EXTREMITY VENOUS Normal compressibility of the LEFT common femoral, superficial femoral, and popliteal veins, as well as the visualized calf veins. Visualized portions of profunda femoral vein and great saphenous vein unremarkable. No filling defects to suggest DVT on grayscale or color Doppler imaging. Doppler waveforms show normal direction of venous flow, normal respiratory plasticity and response to augmentation. OTHER No evidence of superficial  thrombophlebitis or abnormal fluid collection. Limitations: none IMPRESSION: No evidence of femoropopliteal DVT within either lower extremity. Michaelle Birks, MD [facility] Rad[facility] Electronically Signed   By: Michaelle Birks M.D.   On: 02/21/2021 14:24   DG Chest Port 1 View  Result Date: 02/20/2021 CLINICAL DATA:  Chest pain. EXAM: PORTABLE CHEST 1 VIEW COMPARISON:  None. FINDINGS: The heart size and mediastinal contours are within normal limits. There is a 5 mm nodular density projecting over the left lower lung. The lungs are otherwise clear. No acute fractures are seen. IMPRESSION: 1. No acute cardiopulmonary process. 2. Nodular density projecting over the left lower lung, indeterminate. Recommend follow-up nonemergent chest CT. Electronically Signed   By: Ronney Asters M.D.   On: 02/20/2021 20:33   ECHOCARDIOGRAM COMPLETE  Result Date: 02/21/2021    ECHOCARDIOGRAM REPORT   Patient Name:   DIONTRE SCIULLO Date of Exam: 02/21/2021 Medical Rec #:  [id number]     Height:       68.0 in Accession #:    [id number]    Weight:       146.3 lb Date of Birth:  Jun 14, 1980      BSA:          1.789 m Patient Age:    49 years      BP:           139/96 mmHg Patient Gender: M             HR:           103 bpm. Exam Location:  [facility] Procedure: 2D Echo, Cardiac Doppler and Color Doppler Indications:    Chest Pain  History:        Patient has no prior history of Echocardiogram examinations.                 Arrythmias:Tachycardia.  Sonographer:    Wenda Low Referring Phys: [phone] Kenny Stern IMPRESSIONS  1. Left ventricular ejection fraction, by estimation, is 50 to 55%. The left ventricle has low normal function. The left ventricle has no regional wall motion abnormalities. There is mild left ventricular hypertrophy. Left ventricular diastolic parameters were normal.  2. Right ventricular systolic function is normal. The right ventricular size is normal. There is normal  pulmonary artery systolic pressure. The estimated right ventricular systolic pressure is 0000000 mmHg.  3. The mitral valve is  grossly normal. Trivial mitral valve regurgitation.  4. The aortic valve is tricuspid. Aortic valve regurgitation is not visualized. No aortic stenosis is present. Aortic valve mean gradient measures 3.0 mmHg.  5. The inferior vena cava is normal in size with greater than 50% respiratory variability, suggesting right atrial pressure of 3 mmHg. Comparison(s): No prior Echocardiogram. FINDINGS  Left Ventricle: Left ventricular ejection fraction, by estimation, is 50 to 55%. The left ventricle has low normal function. The left ventricle has no regional wall motion abnormalities. The left ventricular internal cavity size was normal in size. There is mild left ventricular hypertrophy. Left ventricular diastolic parameters were normal. Right Ventricle: The right ventricular size is normal. No increase in right ventricular wall thickness. Right ventricular systolic function is normal. There is normal pulmonary artery systolic pressure. The tricuspid regurgitant velocity is 2.29 m/s, and  with an assumed right atrial pressure of 3 mmHg, the estimated right ventricular systolic pressure is 0000000 mmHg. Left Atrium: Left atrial size was normal in size. Right Atrium: Right atrial size was normal in size. Pericardium: There is no evidence of pericardial effusion. Mitral Valve: The mitral valve is grossly normal. Trivial mitral valve regurgitation. MV peak gradient, 3.9 mmHg. The mean mitral valve gradient is 1.0 mmHg. Tricuspid Valve: The tricuspid valve is grossly normal. Tricuspid valve regurgitation is mild. Aortic Valve: The aortic valve is tricuspid. Aortic valve regurgitation is not visualized. No aortic stenosis is present. Aortic valve mean gradient measures 3.0 mmHg. Aortic valve peak gradient measures 6.5 mmHg. Aortic valve area, by VTI measures 3.33 cm. Pulmonic Valve: The pulmonic valve was  grossly normal. Pulmonic valve regurgitation is trivial. Aorta: The aortic root is normal in size and structure. Venous: The inferior vena cava is normal in size with greater than 50% respiratory variability, suggesting right atrial pressure of 3 mmHg. IAS/Shunts: No atrial level shunt detected by color flow Doppler.  LEFT VENTRICLE PLAX 2D LVIDd:         4.00 cm     Diastology LVIDs:         3.00 cm     LV e' medial:    9.57 cm/s LV PW:         1.40 cm     LV E/e' medial:  6.9 LV IVS:        1.00 cm     LV e' lateral:   15.60 cm/s LVOT diam:     2.30 cm     LV E/e' lateral: 4.3 LV SV:         74 LV SV Index:   42 LVOT Area:     4.15 cm  LV Volumes (MOD) LV vol d, MOD A2C: 49.7 ml LV vol d, MOD A4C: 59.9 ml LV vol s, MOD A2C: 25.3 ml LV vol s, MOD A4C: 29.6 ml LV SV MOD A2C:     24.4 ml LV SV MOD A4C:     59.9 ml LV SV MOD BP:      26.6 ml RIGHT VENTRICLE RV Basal diam:  4.00 cm RV Mid diam:    2.90 cm RV S prime:     15.20 cm/s TAPSE (M-mode): 2.4 cm LEFT ATRIUM             Index        RIGHT ATRIUM           Index LA diam:        2.20 cm 1.23 cm/m   RA Area:  14.00 cm LA Vol (A2C):   44.0 ml 24.59 ml/m  RA Volume:   35.30 ml  19.73 ml/m LA Vol (A4C):   34.8 ml 19.45 ml/m LA Biplane Vol: 40.7 ml 22.74 ml/m  AORTIC VALVE                    PULMONIC VALVE AV Area (Vmax):    3.47 cm     PV Vmax:       0.68 m/s AV Area (Vmean):   2.94 cm     PV Peak grad:  1.8 mmHg AV Area (VTI):     3.33 cm AV Vmax:           127.00 cm/s AV Vmean:          80.300 cm/s AV VTI:            0.223 m AV Peak Grad:      6.5 mmHg AV Mean Grad:      3.0 mmHg LVOT Vmax:         106.00 cm/s LVOT Vmean:        56.900 cm/s LVOT VTI:          0.179 m LVOT/AV VTI ratio: 0.80  AORTA Ao Root diam: 3.20 cm MITRAL VALVE               TRICUSPID VALVE MV Area (PHT): 5.93 cm    TR Peak grad:   21.0 mmHg MV Area VTI:   4.37 cm    TR Vmax:        229.00 cm/s MV Peak grad:  3.9 mmHg MV Mean grad:  1.0 mmHg    SHUNTS MV Vmax:       0.99 m/s     Systemic VTI:  0.18 m MV Vmean:      51.9 cm/s   Systemic Diam: 2.30 cm MV Decel Time: 128 msec MV E velocity: 66.40 cm/s MV A velocity: 78.80 cm/s MV E/A ratio:  0.84 [name] MD Electronically signed by [name] MD Signature Date/Time: 02/21/2021/4:02:28 PM    Final     Orson Eva, DO  [facility]  If 7PM-7AM, please contact night-coverage [email or web address] Password [email or web address] 02/22/2021, 8:18 AM   LOS: 1 day

## 2021-02-22 NOTE — Progress Notes (Signed)
This nurse administered Haldol and Seroquel morning dose for anxious and restlessness. Pt has a fine pinpoint rash all over body and complains of feelings hot on the inside. MD made aware. Abt therapy d/c'd. Pt started shaking about 0900 per the sitter, vital signs obtained and MD notified.

## 2021-02-22 NOTE — Consult Note (Signed)
Ronald Hoffman is a 41 year old male with a past history of schizophrenia who presented to 02/20/21 from home for questionable ingestion of unknown substance. UDS+amphetamines, benzodiazepines.   Plan:   -Restart home psychotropic medications while patient  stabilized on inpatient medical unit.

## 2021-02-22 NOTE — Progress Notes (Addendum)
Responded to nursing call:  tremor, tachycardia and somnolence   Subjective:  Patient is sitting in chair awake and conversant.  Denies cp, sob, n/v. Vitals:   02/21/21 1253 02/21/21 2114 02/22/21 0447 02/22/21 1042  BP: (!) 140/94 (!) 133/95 123/85 (!) 114/97  Pulse: (!) 106 (!) 105 (!) 101 (!) 126  Resp: 18 20 20 20   Temp: 98.7 F (37.1 C) 98 F (36.7 C) 97.7 F (36.5 C) 98 F (36.7 C)  TempSrc: Oral Oral  Oral  SpO2: 100% 100% 99% 99%  Weight:      Height:       CV--RRR Lung--CTA Abd--soft+BS/periumbilical tender, no guarding Neuro:  CN II-XII intact, strength 4/5 in RUE, RLE, strength 4/5 LUE, LLE; sensation intact bilateral; no dysmetria;    Assessment/Plan: ??Tremor -no tremor noted at rest -question intention tremor--pt able to suppress on his own volition  Somnolence -pt is awake and alert and following commands -given seroquel AND haldol by nurse at 0830  Tachycardia -reviewed tele--sinus tach;  ?atrial tach -NS bolus ordered -likely due to recent amphetamine use -start low dose metoprolol -Echo reassuring -no fever, no leukocytosis, normal lactate     Orson Eva, DO [facility]

## 2021-02-23 MED ORDER — METOPROLOL TARTRATE 50 MG PO TABS
50.0000 mg | ORAL_TABLET | Freq: Two times a day (BID) | ORAL | Status: DC
  Administered 2021-02-23 – 2021-02-25 (×4): 50 mg via ORAL
  Filled 2021-02-23 (×4): qty 1

## 2021-02-23 MED ORDER — METOPROLOL TARTRATE 25 MG PO TABS: 25.0000 mg | ORAL_TABLET | Freq: Once | ORAL | Status: DC

## 2021-02-23 MED ORDER — LACTATED RINGERS IV BOLUS
1000.0000 mL | Freq: Once | INTRAVENOUS | Status: AC
Start: 2021-02-23 — End: 2021-02-23
  Administered 2021-02-23: 1000 mL via INTRAVENOUS

## 2021-02-23 NOTE — Progress Notes (Addendum)
PROGRESS NOTE  Ronald Hoffman [id number] DOB: [birth year] DOA: 02/20/2021 PCP: Celene Squibb, MD   Brief History:  41 year old male with history of delusional disorder, schizophrenia, and seizure disorder presenting by EMS for potential ingestion of foreign substance.  Apparently the patient's family was unsure if the patient actually ingested any nail polish.  The patient himself denies any foreign substance ingestion.  He denies any suicidal or homicidal ideations.  He is a poor historian.  He states that he lives with his parents.  Apparently, the patient has been complaining of substernal chest pain and epigastric pain for the last 5 months that has been constant.  He states that his pain worsens when he "goes faster".  He denies any frank worsening shortness of breath, coughing, hemoptysis.  He denies any fevers, chills, headache, neck pain.  He has had some intermittent nausea and vomiting.  He states that he vomited 8 times on 02/19/2021, but has not had any emesis since arrival to the hospital.  Lastly the patient has been complaining of leg pain which is been present constantly for the last 5 months.  He states that it is worse when he is just resting.  He denies any worsening leg edema or recent injuries. Lastly, the patient states that he has been hearing voices from his uncle telling him to do things.  He has also been seeing some things on the wall but unable to clarify that. ED Patient had a low-grade temperature of 99.0 F.  He was tachycardic up into the 140s.  He was hemodynamically stable with oxygen saturation 100% on room air.  BMP showed sodium 138, potassium 3.6, CO2 25, serum creatinine 0.59.  LFTs unremarkable.  Lipase 28.  WBC 17.2, hemoglobin 14.1, platelets 420,000.  Lactic acid 2.6>>2.4.  Chest x-ray was negative for edema or consolidation but there was left lower lobe nodular indeterminate density.  VBG showed 7.36/47/46/24   Assessment/Plan: SIRS -Presented with  tachycardia and leukocytosis -UA negative for pyuria -Chest x-ray negative for consolidation -Check PCT <0.10 -Continue empiric antibiotics intially -1/13--d/c vanc and cefepime -blood cultures neg   Lactic acidosis -improved with IVF   Hallucinations -Review of the medical record shows the patient has had delusions in the past and was previously on Zyprexa, Seroquel, and Cogentin -Currently not on any medications -TTS consult   Leg pain -Venous duplex--neg DVT   Chest pain/Sinus Tachycardia -Troponins negative -Echocardiogram--EF 50-55%, no WMA, normal RV -D-dimer--0.43 -TSH--0.259 -Free T4--1.36 -continue IVF   Positive amphetamine drug screen -Patient gives a illicit history of but states that he may have done to be drugs of weight prior to this admission -This certainly may account for the patient's tachycardia   Abdominal pain -CT abd/pelvis--no acute findings -lipase 28 -improved -tolerating diet   Abnormal Thyroid functions -TSH 0.250 -Free T4--1.36 -check Free T3 -check thyroglobulin antibody -anti-TPO antibody -thyroid receptor antibody -would repeat TFTs in 4 weeks   Hypokalemia -replete         Family Communication:  no Family at bedside   Consultants:  TTS   Code Status:  FULL    DVT Prophylaxis:   Lovenox     Procedures: As Listed in Progress Note Above   Antibiotics: Vanc 1/11>>1/13 Cefepime 1/11>>1/13        Subjective: Patient denies fevers, chills, headache, chest pain, dyspnea, nausea, vomiting, diarrhea, abdominal pain, dysuria, hematuria, hematochezia, and melena.   Objective: Vitals:   02/22/21 2152  02/23/21 0204 02/23/21 0833 02/23/21 0902  BP: 114/74 120/90 (!) 133/94 (!) 103/58  Pulse: 100 (!) 110 (!) 119 (!) 103  Resp: 19 18  20   Temp: 98.3 F (36.8 C) 98.1 F (36.7 C)    TempSrc:  Oral    SpO2: 99% 98%  98%  Weight:      Height:        Intake/Output Summary (Last 24 hours) at 02/23/2021 1348 Last  data filed at 02/23/2021 0700 Gross per 24 hour  Intake 1016 ml  Output --  Net 1016 ml   Weight change:  Exam:  General:  Pt is alert, follows commands appropriately, not in acute distress HEENT: No icterus, No thrush, No neck mass, [place]/AT Cardiovascular: RRR, S1/S2, no rubs, no gallops Respiratory: diminished BS.  Bibasilar rales. No wheeze Abdomen: Soft/+BS, non tender, non distended, no guarding Extremities: No edema, No lymphangitis, No petechiae, No rashes, no synovitis   Data Reviewed: I have personally reviewed following labs and imaging studies Basic Metabolic Panel: Recent Labs  Lab 02/20/21 1926 02/21/21 0442 02/22/21 0500  NA 137 138 136  K 3.9 3.6 3.3*  CL 100 103 110  CO2 27 25 21*  GLUCOSE 158* 109* 115*  BUN 8 7 11   CREATININE 0.68 0.59* 0.70  CALCIUM 9.4 9.8 8.5*  MG  --  2.2 2.1  PHOS  --  3.4  --    Liver Function Tests: Recent Labs  Lab 02/20/21 1926 02/21/21 0442 02/22/21 1120  AST 19 15 13*  ALT 20 16 15   ALKPHOS 80 74 66  BILITOT 0.3 0.8 0.8  PROT 7.5 6.9 6.3*  ALBUMIN 4.4 4.0 3.5   Recent Labs  Lab 02/20/21 1926  LIPASE 28   Recent Labs  Lab 02/22/21 1120  AMMONIA 23   Coagulation Profile: Recent Labs  Lab 02/20/21 1926  INR 1.0   CBC: Recent Labs  Lab 02/20/21 1926 02/21/21 0442 02/22/21 0500  WBC 17.2* 10.7* 8.9  NEUTROABS 14.1*  --   --   HGB 14.4 12.9* 13.6  HCT 42.0 38.5* 39.5  MCV 85.5 84.1 85.1  PLT 428* 347 322   Cardiac Enzymes: Recent Labs  Lab 02/20/21 1926  CKTOTAL 58   BNP: Invalid input(s): POCBNP CBG: No results for input(s): GLUCAP in the last 168 hours. HbA1C: No results for input(s): HGBA1C in the last 72 hours. Urine analysis:    Component Value Date/Time   COLORURINE YELLOW 02/20/2021 2049   APPEARANCEUR CLOUDY (A) 02/20/2021 2049   LABSPEC 1.017 02/20/2021 2049   PHURINE 7.0 02/20/2021 2049   GLUCOSEU NEGATIVE 02/20/2021 2049   HGBUR NEGATIVE 02/20/2021 2049   BILIRUBINUR  NEGATIVE 02/20/2021 2049   [place] 02/20/2021 2049   PROTEINUR 30 (A) 02/20/2021 2049   NITRITE NEGATIVE 02/20/2021 2049   LEUKOCYTESUR NEGATIVE 02/20/2021 2049   Sepsis Labs: @LABRCNTIP (procalcitonin:4,lacticidven:4) ) Recent Results (from the past 240 hour(s))  Urine Culture     Status: Abnormal   Collection Time: 02/20/21  9:03 PM   Specimen: Urine, Clean Catch  Result Value Ref Range Status   Specimen Description   Final    URINE, CLEAN CATCH Performed at [facility], [address] [place], [place] [address]    Special Requests   Final    NONE Performed at [facility], [address] [place], [place] [address]    Culture (A)  Final    <10,000 COLONIES/mL INSIGNIFICANT GROWTH Performed at [facility], [place] [address]  [place], [place] [address]    Report Status 02/22/2021 FINAL  Final  Blood Culture (routine x 2)     Status: None (Preliminary result)   Collection Time: 02/20/21  9:07 PM   Specimen: Right Antecubital; Blood  Result Value Ref Range Status   Specimen Description RIGHT ANTECUBITAL  Final   Special Requests   Final    BOTTLES DRAWN AEROBIC AND ANAEROBIC Blood Culture results may not be optimal due to an excessive volume of blood received in culture bottles   Culture   Final    NO GROWTH 3 DAYS Performed at [facility], [address] [place], [place] [address]    Report Status PENDING  Incomplete  Blood Culture (routine x 2)     Status: None (Preliminary result)   Collection Time: 02/20/21  9:07 PM   Specimen: BLOOD RIGHT HAND  Result Value Ref Range Status   Specimen Description BLOOD RIGHT HAND  Final   Special Requests   Final    BOTTLES DRAWN AEROBIC ONLY Blood Culture results may not be optimal due to an inadequate volume of blood received in culture bottles   Culture   Final    NO GROWTH 3 DAYS Performed at [facility], [address], [place], [place] [address]    Report Status PENDING  Incomplete  Resp Panel by RT-PCR (Flu  A&B, Covid) Nasopharyngeal Swab     Status: None   Collection Time: 02/20/21  9:31 PM   Specimen: Nasopharyngeal Swab; Nasopharyngeal(NP) swabs in vial transport medium  Result Value Ref Range Status   SARS Coronavirus 2 by RT PCR NEGATIVE NEGATIVE Final    Comment: (NOTE) SARS-CoV-2 target nucleic acids are NOT DETECTED.  The SARS-CoV-2 RNA is generally detectable in upper respiratory specimens during the acute phase of infection. The lowest concentration of SARS-CoV-2 viral copies this assay can detect is 138 copies/mL. A negative result does not preclude SARS-Cov-2 infection and should not be used as the sole basis for treatment or other patient management decisions. A negative result may occur with  improper specimen collection/handling, submission of specimen other than nasopharyngeal swab, presence of viral mutation(s) within the areas targeted by this assay, and inadequate number of viral copies(<138 copies/mL). A negative result must be combined with clinical observations, patient history, and epidemiological information. The expected result is Negative.  Fact Sheet for Patients:  EntrepreneurPulse.com.au  Fact Sheet for Healthcare Providers:  IncredibleEmployment.be  This test is no t yet approved or cleared by the [place] FDA and  has been authorized for detection and/or diagnosis of SARS-CoV-2 by FDA under an Emergency Use Authorization (EUA). This EUA will remain  in effect (meaning this test can be used) for the duration of the COVID-19 declaration under Section 564(b)(1) of the Act, 21 U.S.C.section 360bbb-3(b)(1), unless the authorization is terminated  or revoked sooner.       Influenza A by PCR NEGATIVE NEGATIVE Final   Influenza B by PCR NEGATIVE NEGATIVE Final    Comment: (NOTE) The Xpert Xpress SARS-CoV-2/FLU/RSV plus assay is intended as an aid in the diagnosis of influenza from Nasopharyngeal swab specimens  and should not be used as a sole basis for treatment. Nasal washings and aspirates are unacceptable for Xpert Xpress SARS-CoV-2/FLU/RSV testing.  Fact Sheet for Patients: EntrepreneurPulse.com.au  Fact Sheet for Healthcare Providers: IncredibleEmployment.be  This test is not yet approved or cleared by the [place] FDA and has been authorized for detection and/or diagnosis of SARS-CoV-2 by FDA under an Emergency Use  Authorization (EUA). This EUA will remain in effect (meaning this test can be used) for the duration of the COVID-19 declaration under Section 564(b)(1) of the Act, 21 U.S.C. section 360bbb-3(b)(1), unless the authorization is terminated or revoked.  Performed at [facility], [address] [place], [place] [address]      Scheduled Meds:  citalopram  20 mg Oral Daily   enoxaparin (LOVENOX) injection  40 mg Subcutaneous Q24H   gabapentin  400 mg Oral TID   hydrOXYzine  25 mg Oral Once   metoprolol tartrate  25 mg Oral Once   metoprolol tartrate  50 mg Oral BID   nicotine  21 mg Transdermal Daily   OLANZapine  10 mg Oral QHS   QUEtiapine  100 mg Oral Daily   QUEtiapine  50 mg Oral BID   Continuous Infusions:  Procedures/Studies: CT ABDOMEN PELVIS W CONTRAST  Result Date: 02/22/2021 CLINICAL DATA:  Mid abdominal pain times several weeks EXAM: CT ABDOMEN AND PELVIS WITH CONTRAST TECHNIQUE: Multidetector CT imaging of the abdomen and pelvis was performed using the standard protocol following bolus administration of intravenous contrast. RADIATION DOSE REDUCTION: This exam was performed according to the departmental dose-optimization program which includes automated exposure control, adjustment of the mA and/or kV according to patient size and/or use of iterative reconstruction technique. CONTRAST:  172mL OMNIPAQUE IOHEXOL 300 MG/ML  SOLN COMPARISON:  None. FINDINGS: Lower chest: No acute abnormality. Hepatobiliary: No suspicious  hepatic lesion. Gallbladder is decompressed. No biliary ductal dilation. Pancreas: No pancreatic ductal dilation or evidence of acute inflammation. Spleen: Normal in size without focal abnormality. Adrenals/Urinary Tract: Bilateral adrenal glands appear normal. No hydronephrosis. Hypodense subcentimeter left interpolar renal lesion is technically too small to accurately characterize but statistically likely reflect cysts. Kidneys demonstrate symmetric enhancement. Urinary bladder is unremarkable for degree of distension. Stomach/Bowel: Radiopaque enteric contrast material traverses the ascending colon. Stomach is unremarkable for degree of distension. No pathologic dilation of small or large bowel. Terminal ileum appears normal. The appendix is not confidently identified however there is no pericecal inflammation. Left-sided colonic diverticulosis without findings of acute diverticulitis. Vascular/Lymphatic: No significant vascular findings are present. No enlarged abdominal or pelvic lymph nodes. Reproductive: Prostate is unremarkable. Other: No abdominal wall hernia or abnormality. No abdominopelvic ascites. Musculoskeletal: Left proximal femoral enchondroma. No acute osseous abnormality or aggressive lytic or blastic lesion of bone. IMPRESSION: 1. No acute abdominopelvic findings. 2. Left-sided colonic diverticulosis without findings of acute diverticulitis. Electronically Signed   By: Dahlia Bailiff M.D.   On: 02/22/2021 12:43   US Venous Img Lower Bilateral (DVT)  Result Date: 02/21/2021 CLINICAL DATA:  Bilateral lower extremity pain. EXAM: BILATERAL LOWER EXTREMITY VENOUS DOPPLER ULTRASOUND TECHNIQUE: Gray-scale sonography with graded compression, as well as color Doppler and duplex ultrasound were performed to evaluate the lower extremity deep venous systems from the level of the common femoral vein and including the common femoral, femoral, profunda femoral, popliteal and calf veins including the  posterior tibial, peroneal and gastrocnemius veins when visible. The superficial great saphenous vein was also interrogated. Spectral Doppler was utilized to evaluate flow at rest and with distal augmentation maneuvers in the common femoral, femoral and popliteal veins. COMPARISON:  Chest XR, concurrent. FINDINGS: RIGHT LOWER EXTREMITY VENOUS Normal compressibility of the RIGHT common femoral, superficial femoral, and popliteal veins, as well as the visualized calf veins. Visualized portions of profunda femoral vein and great saphenous vein unremarkable. No filling defects to suggest DVT on grayscale or color Doppler imaging. Doppler waveforms  show normal direction of venous flow, normal respiratory plasticity and response to augmentation. OTHER No evidence of superficial thrombophlebitis or abnormal fluid collection. Limitations: none LEFT LOWER EXTREMITY VENOUS Normal compressibility of the LEFT common femoral, superficial femoral, and popliteal veins, as well as the visualized calf veins. Visualized portions of profunda femoral vein and great saphenous vein unremarkable. No filling defects to suggest DVT on grayscale or color Doppler imaging. Doppler waveforms show normal direction of venous flow, normal respiratory plasticity and response to augmentation. OTHER No evidence of superficial thrombophlebitis or abnormal fluid collection. Limitations: none IMPRESSION: No evidence of femoropopliteal DVT within either lower extremity. Michaelle Birks, MD [facility] Radiology Electronically Signed   By: Michaelle Birks M.D.   On: 02/21/2021 14:24   DG Chest Port 1 View  Result Date: 02/20/2021 CLINICAL DATA:  Chest pain. EXAM: PORTABLE CHEST 1 VIEW COMPARISON:  None. FINDINGS: The heart size and mediastinal contours are within normal limits. There is a 5 mm nodular density projecting over the left lower lung. The lungs are otherwise clear. No acute fractures are seen.  IMPRESSION: 1. No acute cardiopulmonary process. 2. Nodular density projecting over the left lower lung, indeterminate. Recommend follow-up nonemergent chest CT. Electronically Signed   By: Ronney Asters M.D.   On: 02/20/2021 20:33   ECHOCARDIOGRAM COMPLETE  Result Date: 02/21/2021    ECHOCARDIOGRAM REPORT   Patient Name:   ACESON KUHLMANN Date of Exam: 02/21/2021 Medical Rec #:  [id number]     Height:       68.0 in Accession #:    [id number]    Weight:       146.3 lb Date of Birth:  [birth year]      BSA:          1.789 m Patient Age:    49 years      BP:           139/96 mmHg Patient Gender: M             HR:           103 bpm. Exam Location:  [facility] Procedure: 2D Echo, Cardiac Doppler and Color Doppler Indications:    Chest Pain  History:        Patient has no prior history of Echocardiogram examinations.                 Arrythmias:Tachycardia.  Sonographer:    Wenda Low Referring Phys: [phone] Chairty Toman IMPRESSIONS  1. Left ventricular ejection fraction, by estimation, is 50 to 55%. The left ventricle has low normal function. The left ventricle has no regional wall motion abnormalities. There is mild left ventricular hypertrophy. Left ventricular diastolic parameters were normal.  2. Right ventricular systolic function is normal. The right ventricular size is normal. There is normal pulmonary artery systolic pressure. The estimated right ventricular systolic pressure is 0000000 mmHg.  3. The mitral valve is grossly normal. Trivial mitral valve regurgitation.  4. The aortic valve is tricuspid. Aortic valve regurgitation is not visualized. No aortic stenosis is present. Aortic valve mean gradient measures 3.0 mmHg.  5. The inferior vena cava is normal in size with greater than 50% respiratory variability, suggesting right atrial pressure of 3 mmHg. Comparison(s): No prior Echocardiogram. FINDINGS  Left Ventricle: Left ventricular ejection fraction, by estimation, is 50 to 55%. The left ventricle has low normal  function. The left ventricle has no regional wall motion abnormalities. The left ventricular internal cavity size  was normal in size. There is mild left ventricular hypertrophy. Left ventricular diastolic parameters were normal. Right Ventricle: The right ventricular size is normal. No increase in right ventricular wall thickness. Right ventricular systolic function is normal. There is normal pulmonary artery systolic pressure. The tricuspid regurgitant velocity is 2.29 m/s, and  with an assumed right atrial pressure of 3 mmHg, the estimated right ventricular systolic pressure is 0000000 mmHg. Left Atrium: Left atrial size was normal in size. Right Atrium: Right atrial size was normal in size. Pericardium: There is no evidence of pericardial effusion. Mitral Valve: The mitral valve is grossly normal. Trivial mitral valve regurgitation. MV peak gradient, 3.9 mmHg. The mean mitral valve gradient is 1.0 mmHg. Tricuspid Valve: The tricuspid valve is grossly normal. Tricuspid valve regurgitation is mild. Aortic Valve: The aortic valve is tricuspid. Aortic valve regurgitation is not visualized. No aortic stenosis is present. Aortic valve mean gradient measures 3.0 mmHg. Aortic valve peak gradient measures 6.5 mmHg. Aortic valve area, by VTI measures 3.33 cm. Pulmonic Valve: The pulmonic valve was grossly normal. Pulmonic valve regurgitation is trivial. Aorta: The aortic root is normal in size and structure. Venous: The inferior vena cava is normal in size with greater than 50% respiratory variability, suggesting right atrial pressure of 3 mmHg. IAS/Shunts: No atrial level shunt detected by color flow Doppler.  LEFT VENTRICLE PLAX 2D LVIDd:         4.00 cm     Diastology LVIDs:         3.00 cm     LV e' medial:    9.57 cm/s LV PW:         1.40 cm     LV E/e' medial:  6.9 LV IVS:        1.00 cm     LV e' lateral:   15.60 cm/s LVOT diam:     2.30 cm     LV E/e' lateral: 4.3 LV SV:         74 LV SV Index:   42 LVOT Area:      4.15 cm  LV Volumes (MOD) LV vol d, MOD A2C: 49.7 ml LV vol d, MOD A4C: 59.9 ml LV vol s, MOD A2C: 25.3 ml LV vol s, MOD A4C: 29.6 ml LV SV MOD A2C:     24.4 ml LV SV MOD A4C:     59.9 ml LV SV MOD BP:      26.6 ml RIGHT VENTRICLE RV Basal diam:  4.00 cm RV Mid diam:    2.90 cm RV S prime:     15.20 cm/s TAPSE (M-mode): 2.4 cm LEFT ATRIUM             Index        RIGHT ATRIUM           Index LA diam:        2.20 cm 1.23 cm/m   RA Area:     14.00 cm LA Vol (A2C):   44.0 ml 24.59 ml/m  RA Volume:   35.30 ml  19.73 ml/m LA Vol (A4C):   34.8 ml 19.45 ml/m LA Biplane Vol: 40.7 ml 22.74 ml/m  AORTIC VALVE                    PULMONIC VALVE AV Area (Vmax):    3.47 cm     PV Vmax:       0.68 m/s AV Area (Vmean):   2.94 cm  PV Peak grad:  1.8 mmHg AV Area (VTI):     3.33 cm AV Vmax:           127.00 cm/s AV Vmean:          80.300 cm/s AV VTI:            0.223 m AV Peak Grad:      6.5 mmHg AV Mean Grad:      3.0 mmHg LVOT Vmax:         106.00 cm/s LVOT Vmean:        56.900 cm/s LVOT VTI:          0.179 m LVOT/AV VTI ratio: 0.80  AORTA Ao Root diam: 3.20 cm MITRAL VALVE               TRICUSPID VALVE MV Area (PHT): 5.93 cm    TR Peak grad:   21.0 mmHg MV Area VTI:   4.37 cm    TR Vmax:        229.00 cm/s MV Peak grad:  3.9 mmHg MV Mean grad:  1.0 mmHg    SHUNTS MV Vmax:       0.99 m/s    Systemic VTI:  0.18 m MV Vmean:      51.9 cm/s   Systemic Diam: 2.30 cm MV Decel Time: 128 msec MV E velocity: 66.40 cm/s MV A velocity: 78.80 cm/s MV E/A ratio:  0.84 [name] MD Electronically signed by [name] MD Signature Date/Time: 02/21/2021/4:02:28 PM    Final     Orson Eva, DO  [facility]  If 7PM-7AM, please contact night-coverage [email or web address] Password [email or web address] 02/23/2021, 1:48 PM   LOS: 2 days

## 2021-02-23 NOTE — TOC Progression Note (Signed)
°  Transition of Care [facility]) Screening Note   Patient Details  Name: CELESTE RUESGA Date of Birth: [birth year]   Transition of Care [facility]) CM/SW Contact:    Shade Flood, LCSW Phone Number: 02/23/2021, 10:29 AM    Transition of Care Department [facility]) has reviewed patient and no TOC needs have been identified at this time. We will continue to monitor patient advancement through interdisciplinary progression rounds. If new patient transition needs arise, please place a TOC consult.

## 2021-02-24 LAB — BASIC METABOLIC PANEL
Anion gap: 7 (ref 5–15)
BUN: 13 mg/dL (ref 6–20)
CO2: 25 mmol/L (ref 22–32)
Calcium: 9.1 mg/dL (ref 8.9–10.3)
Chloride: 110 mmol/L (ref 98–111)
Creatinine, Ser: 0.76 mg/dL (ref 0.61–1.24)
GFR, Estimated: >60 mL/min (ref >60)
Glucose, Bld: 109 mg/dL — ABNORMAL HIGH (ref 70–99)
Potassium: 3.2 mmol/L — ABNORMAL LOW (ref 3.5–5.1)
Sodium: 142 mmol/L (ref 135–145)

## 2021-02-24 LAB — THYROID PEROXIDASE ANTIBODY: Thyroperoxidase Ab SerPl-aCnc: 11 IU/mL (ref 0–34)

## 2021-02-24 LAB — THYROID STIMULATING IMMUNOGLOBULIN: Thyroid Stimulating Immunoglob: <0.1 IU/L (ref 0.00–0.55)

## 2021-02-24 LAB — T3, FREE: T3, Free: 2.7 pg/mL (ref 2.0–4.4)

## 2021-02-24 LAB — THYROGLOBULIN ANTIBODY: Thyroglobulin Antibody: <1 IU/mL (ref 0.0–0.9)

## 2021-02-24 LAB — MAGNESIUM: Magnesium: 2.3 mg/dL (ref 1.7–2.4)

## 2021-02-24 MED ORDER — POTASSIUM CHLORIDE CRYS ER 20 MEQ PO TBCR
40.0000 meq | EXTENDED_RELEASE_TABLET | Freq: Once | ORAL | Status: AC
  Administered 2021-02-24: 40 meq via ORAL
  Filled 2021-02-24: qty 2

## 2021-02-24 NOTE — Progress Notes (Signed)
PROGRESS NOTE  Ronald Hoffman [id number] DOB: 07-19-1980 DOA: 02/20/2021 PCP: Celene Squibb, MD  Brief History:  41 year old male with history of delusional disorder, schizophrenia, and seizure disorder presenting by EMS for potential ingestion of foreign substance.  Apparently the patient's family was unsure if the patient actually ingested any nail polish.  The patient himself denies any foreign substance ingestion.  He denies any suicidal or homicidal ideations.  He is a poor historian.  He states that he lives with his parents.  Apparently, the patient has been complaining of substernal chest pain and epigastric pain for the last 5 months that has been constant.  He states that his pain worsens when he "goes faster".  He denies any frank worsening shortness of breath, coughing, hemoptysis.  He denies any fevers, chills, headache, neck pain.  He has had some intermittent nausea and vomiting.  He states that he vomited 8 times on 02/19/2021, but has not had any emesis since arrival to the hospital.  Lastly the patient has been complaining of leg pain which is been present constantly for the last 5 months.  He states that it is worse when he is just resting.  He denies any worsening leg edema or recent injuries. Lastly, the patient states that he has been hearing voices from his uncle telling him to do things.  He has also been seeing some things on the wall but unable to clarify that. ED Patient had a low-grade temperature of 99.0 F.  He was tachycardic up into the 140s.  He was hemodynamically stable with oxygen saturation 100% on room air.  BMP showed sodium 138, potassium 3.6, CO2 25, serum creatinine 0.59.  LFTs unremarkable.  Lipase 28.  WBC 17.2, hemoglobin 14.1, platelets 420,000.  Lactic acid 2.6>>2.4.  Chest x-ray was negative for edema or consolidation but there was left lower lobe nodular indeterminate density.  VBG showed 7.36/47/46/24 Patient was initially started on antibiotics.   When cultures were neg, these were discontinued.  He continued to have some tachycardia for which metoprolol was started with improvement.  Echo was unremarkable.  He improved clinically and became medically stable.  Assessment/Plan: SIRS -Presented with tachycardia and leukocytosis -UA negative for pyuria -Chest x-ray negative for consolidation -Check PCT <0.10 -Continue empiric antibiotics intially -1/13--d/c vanc and cefepime -blood cultures neg -remained stable off abx -SIRS resolved -sepsis ruled out   Lactic acidosis -improved with IVF   Hallucinations -Review of the medical record shows the patient has had delusions in the past and was previously on Zyprexa, Seroquel, and Cogentin -Currently not on any medications -TTS consult   Leg pain -Venous duplex--neg DVT   Chest pain/Sinus Tachycardia -Troponins negative -Echocardiogram--EF 50-55%, no WMA, normal RV -D-dimer--0.43 -TSH--0.259 -Free T4--1.36 -continue IVF -metoprolol started   Positive amphetamine drug screen -Patient gives a illicit history of but states that he may have done to be drugs of weight prior to this admission -This certainly may account for the patient's tachycardia   Abdominal pain -CT abd/pelvis--no acute findings -lipase 28 -improved -tolerating diet   Abnormal Thyroid functions -TSH 0.250 -Free T4--1.36 -check Free T3 -check thyroglobulin antibody -anti-TPO antibody -thyroid receptor antibody -would repeat TFTs in 4 weeks -start methimazole if thyroid antibodies are positive   Hypokalemia -repleted -mag 2.3    NOW MEDICALLY STABLE FOR NEXT VENUE      Family Communication:  no Family at bedside  Consultants:  TTS  Code Status:  FULL  DVT Prophylaxis:   [place] Lovenox   Procedures: As Listed in Progress Note Above  Antibiotics: None       Subjective: Patient denies fevers, chills, headache, chest pain, dyspnea, nausea, vomiting, diarrhea, abdominal pain,  dysuria, hematuria, hematochezia, and melena.   Objective: Vitals:   02/23/21 2058 02/24/21 0630 02/24/21 0829 02/24/21 1035  BP: 130/86 131/82 (!) 145/92 111/70  Pulse: (!) 102 90 (!) 111 99  Resp: 18 17  18   Temp: 99.4 F (37.4 C) 98.4 F (36.9 C)  98.1 F (36.7 C)  TempSrc: Oral Oral    SpO2: 97% 98%  97%  Weight:      Height:        Intake/Output Summary (Last 24 hours) at 02/24/2021 1229 Last data filed at 02/24/2021 0900 Gross per 24 hour  Intake 1760 ml  Output --  Net 1760 ml   Weight change:  Exam:  General:  Pt is alert, follows commands appropriately, not in acute distress HEENT: No icterus, No thrush, No neck mass, [place]/AT Cardiovascular: RRR, S1/S2, no rubs, no gallops Respiratory: diminished BS.  No wheeze Abdomen: Soft/+BS, non tender, non distended, no guarding Extremities: No edema, No lymphangitis, No petechiae, No rashes, no synovitis   Data Reviewed: I have personally reviewed following labs and imaging studies Basic Metabolic Panel: Recent Labs  Lab 02/20/21 1926 02/21/21 0442 02/22/21 0500 02/24/21 0518  NA 137 138 136 142  K 3.9 3.6 3.3* 3.2*  CL 100 103 110 110  CO2 27 25 21* 25  GLUCOSE 158* 109* 115* 109*  BUN 8 7 11 13   CREATININE 0.68 0.59* 0.70 0.76  CALCIUM 9.4 9.8 8.5* 9.1  MG  --  2.2 2.1 2.3  PHOS  --  3.4  --   --    Liver Function Tests: Recent Labs  Lab 02/20/21 1926 02/21/21 0442 02/22/21 1120  AST 19 15 13*  ALT 20 16 15   ALKPHOS 80 74 66  BILITOT 0.3 0.8 0.8  PROT 7.5 6.9 6.3*  ALBUMIN 4.4 4.0 3.5   Recent Labs  Lab 02/20/21 1926  LIPASE 28   Recent Labs  Lab 02/22/21 1120  AMMONIA 23   Coagulation Profile: Recent Labs  Lab 02/20/21 1926  INR 1.0   CBC: Recent Labs  Lab 02/20/21 1926 02/21/21 0442 02/22/21 0500  WBC 17.2* 10.7* 8.9  NEUTROABS 14.1*  --   --   HGB 14.4 12.9* 13.6  HCT 42.0 38.5* 39.5  MCV 85.5 84.1 85.1  PLT 428* 347 322   Cardiac Enzymes: Recent Labs  Lab  02/20/21 1926  CKTOTAL 58   BNP: Invalid input(s): POCBNP CBG: No results for input(s): GLUCAP in the last 168 hours. HbA1C: No results for input(s): HGBA1C in the last 72 hours. Urine analysis:    Component Value Date/Time   COLORURINE YELLOW 02/20/2021 2049   APPEARANCEUR CLOUDY (A) 02/20/2021 2049   LABSPEC 1.017 02/20/2021 2049   PHURINE 7.0 02/20/2021 2049   GLUCOSEU NEGATIVE 02/20/2021 2049   HGBUR NEGATIVE 02/20/2021 2049   BILIRUBINUR NEGATIVE 02/20/2021 2049   [place] 02/20/2021 2049   PROTEINUR 30 (A) 02/20/2021 2049   NITRITE NEGATIVE 02/20/2021 2049   LEUKOCYTESUR NEGATIVE 02/20/2021 2049   Sepsis Labs: @LABRCNTIP (procalcitonin:4,lacticidven:4) ) Recent Results (from the past 240 hour(s))  Urine Culture     Status: Abnormal   Collection Time: 02/20/21  9:03 PM   Specimen: Urine, Clean Catch  Result Value Ref Range Status   Specimen Description  Final    URINE, CLEAN CATCH Performed at [facility], [address], [place], [place] [address]    Special Requests   Final    NONE Performed at [facility], [address], [place], [place] [address]    Culture (A)  Final    <10,000 COLONIES/mL INSIGNIFICANT GROWTH Performed at [facility] [address] [place], [place] [address]    Report Status 02/22/2021 FINAL  Final  Blood Culture (routine x 2)     Status: None (Preliminary result)   Collection Time: 02/20/21  9:07 PM   Specimen: Right Antecubital; Blood  Result Value Ref Range Status   Specimen Description RIGHT ANTECUBITAL  Final   Special Requests   Final    BOTTLES DRAWN AEROBIC AND ANAEROBIC Blood Culture results may not be optimal due to an excessive volume of blood received in culture bottles   Culture   Final    NO GROWTH 4 DAYS Performed at [facility], [address] [place], [place] [address]    Report Status PENDING  Incomplete  Blood Culture (routine x 2)     Status: None (Preliminary result)   Collection Time:  02/20/21  9:07 PM   Specimen: BLOOD RIGHT HAND  Result Value Ref Range Status   Specimen Description BLOOD RIGHT HAND  Final   Special Requests   Final    BOTTLES DRAWN AEROBIC ONLY Blood Culture results may not be optimal due to an inadequate volume of blood received in culture bottles   Culture   Final    NO GROWTH 4 DAYS Performed at [facility], [address] [place], [place] [address]    Report Status PENDING  Incomplete  Resp Panel by RT-PCR (Flu A&B, Covid) Nasopharyngeal Swab     Status: None   Collection Time: 02/20/21  9:31 PM   Specimen: Nasopharyngeal Swab; Nasopharyngeal(NP) swabs in vial transport medium  Result Value Ref Range Status   SARS Coronavirus 2 by RT PCR NEGATIVE NEGATIVE Final    Comment: (NOTE) SARS-CoV-2 target nucleic acids are NOT DETECTED.  The SARS-CoV-2 RNA is generally detectable in upper respiratory specimens during the acute phase of infection. The lowest concentration of SARS-CoV-2 viral copies this assay can detect is 138 copies/mL. A negative result does not preclude SARS-Cov-2 infection and should not be used as the sole basis for treatment or other patient management decisions. A negative result may occur with  improper specimen collection/handling, submission of specimen other than nasopharyngeal swab, presence of viral mutation(s) within the areas targeted by this assay, and inadequate number of viral copies(<138 copies/mL). A negative result must be combined with clinical observations, patient history, and epidemiological information. The expected result is Negative.  Fact Sheet for Patients:  EntrepreneurPulse.com.au  Fact Sheet for Healthcare Providers:  IncredibleEmployment.be  This test is no t yet approved or cleared by the [place] FDA and  has been authorized for detection and/or diagnosis of SARS-CoV-2 by FDA under an Emergency Use Authorization (EUA). This EUA will remain  in effect  (meaning this test can be used) for the duration of the COVID-19 declaration under Section 564(b)(1) of the Act, 21 U.S.C.section 360bbb-3(b)(1), unless the authorization is terminated  or revoked sooner.       Influenza A by PCR NEGATIVE NEGATIVE Final   Influenza B by PCR NEGATIVE NEGATIVE Final    Comment: (NOTE) The Xpert Xpress SARS-CoV-2/FLU/RSV plus assay is intended as an aid in the diagnosis of influenza from Nasopharyngeal swab specimens  and should not be used as a sole basis for treatment. Nasal washings and aspirates are unacceptable for Xpert Xpress SARS-CoV-2/FLU/RSV testing.  Fact Sheet for Patients: EntrepreneurPulse.com.au  Fact Sheet for Healthcare Providers: IncredibleEmployment.be  This test is not yet approved or cleared by the [place] FDA and has been authorized for detection and/or diagnosis of SARS-CoV-2 by FDA under an Emergency Use Authorization (EUA). This EUA will remain in effect (meaning this test can be used) for the duration of the COVID-19 declaration under Section 564(b)(1) of the Act, 21 U.S.C. section 360bbb-3(b)(1), unless the authorization is terminated or revoked.  Performed at [facility], [address], [place], [place] [address]      Scheduled Meds:  citalopram  20 mg Oral Daily   enoxaparin (LOVENOX) injection  40 mg Subcutaneous Q24H   gabapentin  400 mg Oral TID   hydrOXYzine  25 mg Oral Once   metoprolol tartrate  25 mg Oral Once   metoprolol tartrate  50 mg Oral BID   nicotine  21 mg Transdermal Daily   OLANZapine  10 mg Oral QHS   QUEtiapine  100 mg Oral Daily   Continuous Infusions:  Procedures/Studies: CT ABDOMEN PELVIS W CONTRAST  Result Date: 02/22/2021 CLINICAL DATA:  Mid abdominal pain times several weeks EXAM: CT ABDOMEN AND PELVIS WITH CONTRAST TECHNIQUE: Multidetector CT imaging of the abdomen and pelvis was performed using the standard protocol following bolus  administration of intravenous contrast. RADIATION DOSE REDUCTION: This exam was performed according to the departmental dose-optimization program which includes automated exposure control, adjustment of the mA and/or kV according to patient size and/or use of iterative reconstruction technique. CONTRAST:  144mL OMNIPAQUE IOHEXOL 300 MG/ML  SOLN COMPARISON:  None. FINDINGS: Lower chest: No acute abnormality. Hepatobiliary: No suspicious hepatic lesion. Gallbladder is decompressed. No biliary ductal dilation. Pancreas: No pancreatic ductal dilation or evidence of acute inflammation. Spleen: Normal in size without focal abnormality. Adrenals/Urinary Tract: Bilateral adrenal glands appear normal. No hydronephrosis. Hypodense subcentimeter left interpolar renal lesion is technically too small to accurately characterize but statistically likely reflect cysts. Kidneys demonstrate symmetric enhancement. Urinary bladder is unremarkable for degree of distension. Stomach/Bowel: Radiopaque enteric contrast material traverses the ascending colon. Stomach is unremarkable for degree of distension. No pathologic dilation of small or large bowel. Terminal ileum appears normal. The appendix is not confidently identified however there is no pericecal inflammation. Left-sided colonic diverticulosis without findings of acute diverticulitis. Vascular/Lymphatic: No significant vascular findings are present. No enlarged abdominal or pelvic lymph nodes. Reproductive: Prostate is unremarkable. Other: No abdominal wall hernia or abnormality. No abdominopelvic ascites. Musculoskeletal: Left proximal femoral enchondroma. No acute osseous abnormality or aggressive lytic or blastic lesion of bone. IMPRESSION: 1. No acute abdominopelvic findings. 2. Left-sided colonic diverticulosis without findings of acute diverticulitis. Electronically Signed   By: Dahlia Bailiff M.D.   On: 02/22/2021 12:43   US Venous Img Lower Bilateral (DVT)  Result  Date: 02/21/2021 CLINICAL DATA:  Bilateral lower extremity pain. EXAM: BILATERAL LOWER EXTREMITY VENOUS DOPPLER ULTRASOUND TECHNIQUE: Gray-scale sonography with graded compression, as well as color Doppler and duplex ultrasound were performed to evaluate the lower extremity deep venous systems from the level of the common femoral vein and including the common femoral, femoral, profunda femoral, popliteal and calf veins including the posterior tibial, peroneal and gastrocnemius veins when visible. The superficial great saphenous vein was also interrogated. Spectral Doppler was utilized to evaluate flow at rest and with distal augmentation maneuvers in the common femoral, femoral  and popliteal veins. COMPARISON:  Chest XR, concurrent. FINDINGS: RIGHT LOWER EXTREMITY VENOUS Normal compressibility of the RIGHT common femoral, superficial femoral, and popliteal veins, as well as the visualized calf veins. Visualized portions of profunda femoral vein and great saphenous vein unremarkable. No filling defects to suggest DVT on grayscale or color Doppler imaging. Doppler waveforms show normal direction of venous flow, normal respiratory plasticity and response to augmentation. OTHER No evidence of superficial thrombophlebitis or abnormal fluid collection. Limitations: none LEFT LOWER EXTREMITY VENOUS Normal compressibility of the LEFT common femoral, superficial femoral, and popliteal veins, as well as the visualized calf veins. Visualized portions of profunda femoral vein and great saphenous vein unremarkable. No filling defects to suggest DVT on grayscale or color Doppler imaging. Doppler waveforms show normal direction of venous flow, normal respiratory plasticity and response to augmentation. OTHER No evidence of superficial thrombophlebitis or abnormal fluid collection. Limitations: none IMPRESSION: No evidence of femoropopliteal DVT within either lower extremity. Michaelle Birks, MD Vascular and Interventional Radiology  [facility] Radiology Electronically Signed   By: Michaelle Birks M.D.   On: 02/21/2021 14:24   DG Chest Port 1 View  Result Date: 02/20/2021 CLINICAL DATA:  Chest pain. EXAM: PORTABLE CHEST 1 VIEW COMPARISON:  None. FINDINGS: The heart size and mediastinal contours are within normal limits. There is a 5 mm nodular density projecting over the left lower lung. The lungs are otherwise clear. No acute fractures are seen. IMPRESSION: 1. No acute cardiopulmonary process. 2. Nodular density projecting over the left lower lung, indeterminate. Recommend follow-up nonemergent chest CT. Electronically Signed   By: Ronney Asters M.D.   On: 02/20/2021 20:33   ECHOCARDIOGRAM COMPLETE  Result Date: 02/21/2021    ECHOCARDIOGRAM REPORT   Patient Name:   ARTHOR STELMA Date of Exam: 02/21/2021 Medical Rec #:  [id number]     Height:       68.0 in Accession #:    [id number]    Weight:       146.3 lb Date of Birth:  [birth year]      BSA:          1.789 m Patient Age:    64 years      BP:           139/96 mmHg Patient Gender: M             HR:           103 bpm. Exam Location:  [facility] Procedure: 2D Echo, Cardiac Doppler and Color Doppler Indications:    Chest Pain  History:        Patient has no prior history of Echocardiogram examinations.                 Arrythmias:Tachycardia.  Sonographer:    Wenda Low Referring Phys: [phone] Kimberlynn Lumbra IMPRESSIONS  1. Left ventricular ejection fraction, by estimation, is 50 to 55%. The left ventricle has low normal function. The left ventricle has no regional wall motion abnormalities. There is mild left ventricular hypertrophy. Left ventricular diastolic parameters were normal.  2. Right ventricular systolic function is normal. The right ventricular size is normal. There is normal pulmonary artery systolic pressure. The estimated right ventricular systolic pressure is 0000000 mmHg.  3. The mitral valve is grossly normal. Trivial mitral valve regurgitation.  4. The aortic valve is  tricuspid. Aortic valve regurgitation is not visualized. No aortic stenosis is present. Aortic valve mean gradient measures 3.0 mmHg.  5. The inferior vena cava  is normal in size with greater than 50% respiratory variability, suggesting right atrial pressure of 3 mmHg. Comparison(s): No prior Echocardiogram. FINDINGS  Left Ventricle: Left ventricular ejection fraction, by estimation, is 50 to 55%. The left ventricle has low normal function. The left ventricle has no regional wall motion abnormalities. The left ventricular internal cavity size was normal in size. There is mild left ventricular hypertrophy. Left ventricular diastolic parameters were normal. Right Ventricle: The right ventricular size is normal. No increase in right ventricular wall thickness. Right ventricular systolic function is normal. There is normal pulmonary artery systolic pressure. The tricuspid regurgitant velocity is 2.29 m/s, and  with an assumed right atrial pressure of 3 mmHg, the estimated right ventricular systolic pressure is 0000000 mmHg. Left Atrium: Left atrial size was normal in size. Right Atrium: Right atrial size was normal in size. Pericardium: There is no evidence of pericardial effusion. Mitral Valve: The mitral valve is grossly normal. Trivial mitral valve regurgitation. MV peak gradient, 3.9 mmHg. The mean mitral valve gradient is 1.0 mmHg. Tricuspid Valve: The tricuspid valve is grossly normal. Tricuspid valve regurgitation is mild. Aortic Valve: The aortic valve is tricuspid. Aortic valve regurgitation is not visualized. No aortic stenosis is present. Aortic valve mean gradient measures 3.0 mmHg. Aortic valve peak gradient measures 6.5 mmHg. Aortic valve area, by VTI measures 3.33 cm. Pulmonic Valve: The pulmonic valve was grossly normal. Pulmonic valve regurgitation is trivial. Aorta: The aortic root is normal in size and structure. Venous: The inferior vena cava is normal in size with greater than 50% respiratory  variability, suggesting right atrial pressure of 3 mmHg. IAS/Shunts: No atrial level shunt detected by color flow Doppler.  LEFT VENTRICLE PLAX 2D LVIDd:         4.00 cm     Diastology LVIDs:         3.00 cm     LV e' medial:    9.57 cm/s LV PW:         1.40 cm     LV E/e' medial:  6.9 LV IVS:        1.00 cm     LV e' lateral:   15.60 cm/s LVOT diam:     2.30 cm     LV E/e' lateral: 4.3 LV SV:         74 LV SV Index:   42 LVOT Area:     4.15 cm  LV Volumes (MOD) LV vol d, MOD A2C: 49.7 ml LV vol d, MOD A4C: 59.9 ml LV vol s, MOD A2C: 25.3 ml LV vol s, MOD A4C: 29.6 ml LV SV MOD A2C:     24.4 ml LV SV MOD A4C:     59.9 ml LV SV MOD BP:      26.6 ml RIGHT VENTRICLE RV Basal diam:  4.00 cm RV Mid diam:    2.90 cm RV S prime:     15.20 cm/s TAPSE (M-mode): 2.4 cm LEFT ATRIUM             Index        RIGHT ATRIUM           Index LA diam:        2.20 cm 1.23 cm/m   RA Area:     14.00 cm LA Vol (A2C):   44.0 ml 24.59 ml/m  RA Volume:   35.30 ml  19.73 ml/m LA Vol (A4C):   34.8 ml 19.45 ml/m LA Biplane Vol: 40.7 ml 22.74  ml/m  AORTIC VALVE                    PULMONIC VALVE AV Area (Vmax):    3.47 cm     PV Vmax:       0.68 m/s AV Area (Vmean):   2.94 cm     PV Peak grad:  1.8 mmHg AV Area (VTI):     3.33 cm AV Vmax:           127.00 cm/s AV Vmean:          80.300 cm/s AV VTI:            0.223 m AV Peak Grad:      6.5 mmHg AV Mean Grad:      3.0 mmHg LVOT Vmax:         106.00 cm/s LVOT Vmean:        56.900 cm/s LVOT VTI:          0.179 m LVOT/AV VTI ratio: 0.80  AORTA Ao Root diam: 3.20 cm MITRAL VALVE               TRICUSPID VALVE MV Area (PHT): 5.93 cm    TR Peak grad:   21.0 mmHg MV Area VTI:   4.37 cm    TR Vmax:        229.00 cm/s MV Peak grad:  3.9 mmHg MV Mean grad:  1.0 mmHg    SHUNTS MV Vmax:       0.99 m/s    Systemic VTI:  0.18 m MV Vmean:      51.9 cm/s   Systemic Diam: 2.30 cm MV Decel Time: 128 msec MV E velocity: 66.40 cm/s MV A velocity: 78.80 cm/s MV E/A ratio:  0.84 [name] MD  Electronically signed by [name] MD Signature Date/Time: 02/21/2021/4:02:28 PM    Final     Orson Eva, DO  [facility]  If 7PM-7AM, please contact night-coverage [email or web address] Password [email or web address] 02/24/2021, 12:29 PM   LOS: 3 days

## 2021-02-24 NOTE — BH Assessment (Signed)
Comprehensive Clinical Assessment (CCA) Note  02/24/2021 Ronald Hoffman [id number]  Disposition: Per Ricky Ala, NP, patient is psychiatrically cleared and recommended for outpatient treatment.  Flowsheet Row ED to Hosp-Admission (Current) from 02/20/2021 in [facility] ED from 01/06/2021 in [facility] ED from 03/18/2020 in [facility] No Risk No Risk No Risk       The patient demonstrates the following risk factors for suicide: Chronic risk factors for suicide include: psychiatric disorder of schizophrenia and substance use disorder. Acute risk factors for suicide include: unemployment. Protective factors for this patient include: positive social support, responsibility to others (children, family), and hope for the future. Considering these factors, the overall suicide risk at this point appears to be low. Patient is appropriate for outpatient follow up.  Ronald Hoffman is a 41 year old male presenting to [facility] from home by EMS.  There were some concerns about patient ingesting nail polish removal per the family.  Patient has a history of schizophrenia. Patient denies ingesting fingernail polish removal and reports he was in the hospital because he was having auditory hallucinations and feeling like he had two sets of consciousness. Patient reports AH sounded like background noises talking about me. Patient denies hallucinating currently. Patient reports prior to ED visit he was having panic attacks, feeling tired having no energy to do things and his legs were hurting bad. Patient also reports history of drug use to include meth, THC and pills. Patient reports last use of THC was 2 days before coming into the hospital and reports using meth a week before being hospitalized.   Patient reports diagnosis of schizophrenia and he has not been taking medications. Patient reports he needs outpatient services when he  leaves the hospital. Patient also reports a history of inpatient treatment about 15 years ago for drug addictions. Patient reports current legal issues and reports last year he was arrested for assault and trespassing after being rude with a bank teller while he was trying to merge his two accounts. Patient acknowledges that he was using drugs at the time and is aware that he did not have a lot of money in the bank. Patient reports he has court coming up for those charges. Patient lives at home with his parents and denies having access to a firearm. Patient reports he was fired from his job due to having panic attacks and being late and missing work.   Patient is oriented to person, place and situation. Patient is alert, engaged and cooperative. Patient eye contact and tone of voice is normal, patient affect is appropriate with congruent mood. Patient denies SI, HI, AVH. Patient is calm and in no distress. Patient does not appear, manic, psychotic or delusional nor does patient appear to be responding to internal/external stimuli. Patient is pleasant. Patient consents for TTS to contact his mother, Ronald Hoffman.  Mom reports that patient never ingested anything, but he did have a seizure at home. Mom reports pt dad could not find a pulse and so he was transferred to [facility] for treatment. Mom reports that patient was receiving MH treatment at one time but had a reaction to the medications he was taking. Mom report that patient went to jail and stayed for about three months, and he was supposed to go to [facility] but never made it. Patient was released from jail around Thanksgiving. Mom reports she spoke to patient and feels like he is back at his baseline.  Mom denies having any safety concerns and reports he can return home.   Chief Complaint:  Chief Complaint  Patient presents with   Ingestion   Tachycardia   Delusional   Visit Diagnosis: Psychosis, unspecified psychosis type ([place])    CCA Screening, Triage and  Referral (STR)  Patient Reported Information How did you hear about [place]? Other (Comment)  What Is the Reason for Your Visit/Call Today? Ingestion and psychosis  How Long Has This Been Causing You Problems? 1 wk - 1 month  What Do You Feel Would Help You the Most Today? Treatment for Depression or other mood problem   Have You Recently Had Any Thoughts About Hurting Yourself? No  Are You Planning to Commit Suicide/Harm Yourself At This time? No   Have you Recently Had Thoughts About [place]? No  Are You Planning to Harm Someone at This Time? No  Explanation: No data recorded  Have You Used Any Alcohol or Drugs in the Past 24 Hours? No  How Long Ago Did You Use Drugs or Alcohol? No data recorded What Did You Use and How Much? No data recorded  Do You Currently Have a Therapist/Psychiatrist? No data recorded Name of Therapist/Psychiatrist: No data recorded  Have You Been Recently Discharged From Any Office Practice or Programs? No data recorded Explanation of Discharge From Practice/Program: No data recorded    CCA Screening Triage Referral Assessment Type of Contact: No data recorded Telemedicine Service Delivery:   Is this Initial or Reassessment? No data recorded Date Telepsych consult ordered in [facility]:  No data recorded Time Telepsych consult ordered in [facility]:  No data recorded Location of Assessment: No data recorded Provider Location: No data recorded  Collateral Involvement: No data recorded  Does Patient Have a Temple? No data recorded Name and Contact of Legal Guardian: No data recorded If Minor and Not Living with Parent(s), Who has Custody? No data recorded Is CPS involved or ever been involved? No data recorded Is APS involved or ever been involved? No data recorded  Patient Determined To Be At Risk for Harm To Self or Others Based on Review of Patient Reported Information or Presenting Complaint? No data recorded Method:  No data recorded Availability of Means: No data recorded Intent: No data recorded Notification Required: No data recorded Additional Information for Danger to Others Potential: No data recorded Additional Comments for Danger to Others Potential: No data recorded Are There Guns or Other Weapons in Your Home? No data recorded Types of Guns/Weapons: No data recorded Are These Weapons Safely Secured?                            No data recorded Who Could Verify You Are Able To Have These Secured: No data recorded Do You Have any Outstanding Charges, Pending Court Dates, Parole/Probation? No data recorded Contacted To Inform of Risk of Harm To Self or Others: No data recorded   Does Patient Present under Involuntary Commitment? No data recorded IVC Papers Initial File Date: No data recorded  [place] of Residence: No data recorded  Patient Currently Receiving the Following Services: No data recorded  Determination of Need: Emergent (2 hours)   Options For Referral: Medication Management; Outpatient Therapy     CCA Biopsychosocial Patient Reported Schizophrenia/Schizoaffective Diagnosis in Past: Yes   Strengths: Not assessed.   Mental Health Symptoms Depression:   Tearfulness; Sleep (too much or little); Difficulty Concentrating; Fatigue (Sad,  bored.)   Duration of Depressive symptoms:    Mania:   N/A (UTA)   Anxiety:    Worrying; Tension   Psychosis:   None   Duration of Psychotic symptoms:  Duration of Psychotic Symptoms: Less than six months   Trauma:   N/A (UTA)   Obsessions:   N/A (UTA)   Compulsions:   N/A (UTA)   Inattention:   N/A (UTA)   Hyperactivity/Impulsivity:   N/A (UTA)   Oppositional/Defiant Behaviors:   N/A (UTA)   Emotional Irregularity:   N/A   Other Mood/Personality Symptoms:  No data recorded   Mental Status Exam Appearance and self-care  Stature:   Average   Weight:   Average weight   Clothing:   -- (Pt in scrubs.)    Grooming:   Normal   Cosmetic use:   None   Posture/gait:   Normal   Motor activity:   Not Remarkable   Sensorium  Attention:   Normal   Concentration:   Normal   Orientation:   Place   Recall/memory:   Normal   Affect and Mood  Affect:   Appropriate (Pleasant.)   Mood:   Other (Comment) (Pleasant.)   Relating  Eye contact:   Normal   Facial expression:   Responsive   Attitude toward examiner:   Cooperative   Thought and Language  Speech flow:  Normal   Thought content:  No data recorded  Preoccupation:   None   Hallucinations:   None   Organization:  No data recorded  [employer] of Knowledge:   Poor   Intelligence:   Average   Abstraction:   -- (UTA)   Judgement:   Poor   Reality Testing:   Adequate (UTA)   Insight:   Fair   Decision Making:   Impulsive   Social Functioning  Social Maturity:   Responsible Special educational needs teacher)   Social Judgement:   Normal (UTA)   Stress  Stressors:   Scientist, research (physical sciences) (UTA)   Coping Ability:   Deficient supports   Skill Deficits:  No data recorded  Supports:   Family     Religion: Religion/Spirituality Are You A Religious Person?:  (Pt is spiritual.)  Leisure/Recreation: Leisure / Recreation Do You Have Hobbies?: Yes  Exercise/Diet: Exercise/Diet Do You Exercise?: Yes Do You Follow a Special Diet?: No Do You Have Any Trouble Sleeping?: Yes   CCA Employment/Education Employment/Work Situation: Employment / Work Situation Employment Situation: Unemployed Patient's Job has Been Impacted by Current Illness: No Has Patient ever Been in Passenger transport manager?: No  Education: Education Last Grade Completed: 12   CCA Family/Childhood History Family and Relationship History: Family history Does patient have children?: No  Childhood History:  Childhood History By whom was/is the patient raised?: Both parents Did patient suffer any verbal/emotional/physical/sexual abuse as a child?:  No Has patient ever been sexually abused/assaulted/raped as an adolescent or adult?: No Witnessed domestic violence?: No Has patient been affected by domestic violence as an adult?:  (NA)  Child/Adolescent Assessment:     CCA Substance Use Alcohol/Drug Use: Alcohol / Drug Use Pain Medications: See MAR Prescriptions: See MAR Over the Counter: See MAR History of alcohol / drug use?: Yes (Pt denies.) Longest period of sobriety (when/how long): Unknown Substance #1 Name of Substance 1: THC 1 - Last Use / Amount: 02/18/21 Substance #2 Name of Substance 2: Meth 2 - Last Use / Amount: a week ago  ASAM's:  Six Dimensions of Multidimensional Assessment  Dimension 1:  Acute Intoxication and/or Withdrawal Potential:      Dimension 2:  Biomedical Conditions and Complications:      Dimension 3:  Emotional, Behavioral, or Cognitive Conditions and Complications:     Dimension 4:  Readiness to Change:     Dimension 5:  Relapse, Continued use, or Continued Problem Potential:     Dimension 6:  Recovery/Living Environment:     ASAM Severity Score:    ASAM Recommended Level of Treatment: ASAM Recommended Level of Treatment: Level I Outpatient Treatment   Substance use Disorder (SUD)    Recommendations for Services/Supports/Treatments: Recommendations for Services/Supports/Treatments Recommendations For Services/Supports/Treatments: Other (Comment) (Pt to be observed for safety and reassessed by psychiatry.)  Discharge Disposition:    DSM5 Diagnoses: Patient Active Problem List   Diagnosis Date Noted   Lactic acidosis 02/21/2021   Sepsis ([place])    Tachycardia    SIRS (systemic inflammatory response syndrome) ([place]) 02/20/2021   Drug abuse ([place]) 03/19/2020   Substance-induced psychotic disorder ([place]) 03/19/2020   Acute psychosis ([place]) 10/26/2019   Delusional disorder ([place]) 10/25/2019     Referrals to Alternative Service(s): Referred to Alternative  Service(s):   Place:   Date:   Time:    Referred to Alternative Service(s):   Place:   Date:   Time:    Referred to Alternative Service(s):   Place:   Date:   Time:    Referred to Alternative Service(s):   Place:   Date:   Time:     Luther Redo, [facility]

## 2021-02-24 NOTE — Progress Notes (Signed)
TTS evaluated patient and states that patient is psych cleared and will need outpatient resources. MD Tat notified.

## 2021-02-25 DIAGNOSIS — E0590 Thyrotoxicosis, unspecified without thyrotoxic crisis or storm: Secondary | ICD-10-CM

## 2021-02-25 MED ORDER — OLANZAPINE 10 MG PO TABS: 10.0000 mg | ORAL_TABLET | Freq: Every day | ORAL | 1 refills | Status: DC

## 2021-02-25 MED ORDER — METOPROLOL TARTRATE 50 MG PO TABS: 50.0000 mg | ORAL_TABLET | Freq: Two times a day (BID) | ORAL | 1 refills | Status: DC

## 2021-02-25 MED ORDER — QUETIAPINE FUMARATE 100 MG PO TABS: 100.0000 mg | ORAL_TABLET | Freq: Every day | ORAL | 1 refills | Status: DC

## 2021-02-25 MED ORDER — GABAPENTIN 400 MG PO CAPS: 400.0000 mg | ORAL_CAPSULE | Freq: Three times a day (TID) | ORAL | 1 refills | Status: DC

## 2021-02-25 MED ORDER — CITALOPRAM HYDROBROMIDE 20 MG PO TABS: 20.0000 mg | ORAL_TABLET | Freq: Every day | ORAL | 1 refills | Status: DC

## 2021-02-25 NOTE — Discharge Summary (Signed)
Physician Discharge Summary  Ronald Hoffman [id number] DOB: Aug 30, 1980 DOA: 02/20/2021  PCP: Celene Squibb, MD  Admit date: 02/20/2021 Discharge date: 02/25/2021  Admitted From: Home Disposition:  Home   Recommendations for Outpatient Follow-up:  Follow up with PCP in 1-2 weeks Please obtain BMP/CBC in one week Please refer patient to endocrine for hyperthyroidism     Discharge Condition: Stable CODE STATUS:FULL Diet recommendation:  Regular   Brief/Interim Summary: 41 year old male with history of delusional disorder, schizophrenia, and seizure disorder presenting by EMS for potential ingestion of foreign substance.  Apparently the patient's family was unsure if the patient actually ingested any nail polish.  The patient himself denies any foreign substance ingestion.  He denies any suicidal or homicidal ideations.  He is a poor historian.  He states that he lives with his parents.  Apparently, the patient has been complaining of substernal chest pain and epigastric pain for the last 5 months that has been constant.  He states that his pain worsens when he "goes faster".  He denies any frank worsening shortness of breath, coughing, hemoptysis.  He denies any fevers, chills, headache, neck pain.  He has had some intermittent nausea and vomiting.  He states that he vomited 8 times on 02/19/2021, but has not had any emesis since arrival to the hospital.  Lastly the patient has been complaining of leg pain which is been present constantly for the last 5 months.  He states that it is worse when he is just resting.  He denies any worsening leg edema or recent injuries. Lastly, the patient states that he has been hearing voices from his uncle telling him to do things.  He has also been seeing some things on the wall but unable to clarify that. ED Patient had a low-grade temperature of 99.0 F.  He was tachycardic up into the 140s.  He was hemodynamically stable with oxygen saturation 100% on room  air.  BMP showed sodium 138, potassium 3.6, CO2 25, serum creatinine 0.59.  LFTs unremarkable.  Lipase 28.  WBC 17.2, hemoglobin 14.1, platelets 420,000.  Lactic acid 2.6>>2.4.  Chest x-ray was negative for edema or consolidation but there was left lower lobe nodular indeterminate density.  VBG showed 7.36/47/46/24 Patient was initially started on antibiotics.  When cultures were neg, these were discontinued.  He continued to have some tachycardia for which metoprolol was started with improvement.  Echo was unremarkable.  He improved clinically and became medically stable.  Discharge Diagnoses:   SIRS -Presented with tachycardia and leukocytosis -UA negative for pyuria -Chest x-ray negative for consolidation -Check PCT <0.10 -Continue empiric antibiotics intially -1/13--d/c vanc and cefepime -blood cultures neg -remained stable off abx -SIRS resolved -sepsis ruled out   Lactic acidosis -improved with IVF   Hallucinations -Review of the medical record shows the patient has had delusions in the past and was previously on Zyprexa, Seroquel, and Cogentin -Currently not on any medications -TTS consult-->cleared pt for d/c home   Leg pain -Venous duplex--neg DVT   Chest pain/Sinus Tachycardia -Troponins negative -Echocardiogram--EF 50-55%, no WMA, normal RV -D-dimer--0.43 -TSH--0.259 -Free T4--1.36 -continue IVF -metoprolol started>>improved.  HR 80-100 on day of dc during my eval   Positive amphetamine drug screen -Patient gives a illicit history of but states that he may have done to be drugs of weight prior to this admission -This certainly may account for the patient's tachycardia   Abdominal pain -CT abd/pelvis--no acute findings -lipase 28 -improved -tolerating diet   Abnormal  Thyroid functions -TSH 0.250 -Free T4--1.36 -check Free T3--2.7 (normal) -check thyroglobulin antibody--neg -anti-TPO antibody--neg -thyroid receptor antibody/stimulating  immunoglob--neg -would repeat TFTs in 4 weeks -start methimazole if thyroid antibodies are positive -I've mad referral to outpatient endocrine for eval   Hypokalemia -repleted -mag 2.3  Discharge Instructions  Discharge Instructions     Ambulatory referral to Endocrinology   Complete by: As directed    hyperthyroidism      Allergies as of 02/25/2021       Reactions   Hydrocodone Itching   Penicillins Other (See Comments)   Unknown childhood allergy Has patient had a PCN reaction causing immediate rash, facial/tongue/throat swelling, SOB or lightheadedness with hypotension: unknown Has patient had a PCN reaction causing severe rash involving mucus membranes or skin necrosis: unknown Has patient had a PCN reaction that required hospitalization: unknown Has patient had a PCN reaction occurring within the last 10 years:no If all of the above answers are "NO", then may proceed with Cephalosporin use.        Medication List     STOP taking these medications    diphenhydrAMINE 25 MG tablet Commonly known as: BENADRYL   famotidine 20 MG tablet Commonly known as: PEPCID   hydrOXYzine 25 MG tablet Commonly known as: ATARAX   nicotine 21 mg/24hr patch Commonly known as: NICODERM CQ - dosed in mg/24 hours   sulfamethoxazole-trimethoprim 800-160 MG tablet Commonly known as: BACTRIM DS   traZODone 50 MG tablet Commonly known as: DESYREL       TAKE these medications    citalopram 20 MG tablet Commonly known as: CELEXA Take 1 tablet (20 mg total) by mouth daily.   gabapentin 400 MG capsule Commonly known as: NEURONTIN Take 1 capsule (400 mg total) by mouth 3 (three) times daily.   metoprolol tartrate 50 MG tablet Commonly known as: LOPRESSOR Take 1 tablet (50 mg total) by mouth 2 (two) times daily.   OLANZapine 10 MG tablet Commonly known as: ZYPREXA Take 1 tablet (10 mg total) by mouth at bedtime.   QUEtiapine 100 MG tablet Commonly known as:  SEROQUEL Take 1 tablet (100 mg total) by mouth daily. What changed: Another medication with the same name was removed. Continue taking this medication, and follow the directions you see here.        Allergies  Allergen Reactions   Hydrocodone Itching   Penicillins Other (See Comments)    Unknown childhood allergy Has patient had a PCN reaction causing immediate rash, facial/tongue/throat swelling, SOB or lightheadedness with hypotension: unknown Has patient had a PCN reaction causing severe rash involving mucus membranes or skin necrosis: unknown Has patient had a PCN reaction that required hospitalization: unknown Has patient had a PCN reaction occurring within the last 10 years:no If all of the above answers are "NO", then may proceed with Cephalosporin use.     Consultations: tetepsych   Procedures/Studies: CT ABDOMEN PELVIS W CONTRAST  Result Date: 02/22/2021 CLINICAL DATA:  Mid abdominal pain times several weeks EXAM: CT ABDOMEN AND PELVIS WITH CONTRAST TECHNIQUE: Multidetector CT imaging of the abdomen and pelvis was performed using the standard protocol following bolus administration of intravenous contrast. RADIATION DOSE REDUCTION: This exam was performed according to the departmental dose-optimization program which includes automated exposure control, adjustment of the mA and/or kV according to patient size and/or use of iterative reconstruction technique. CONTRAST:  145mL OMNIPAQUE IOHEXOL 300 MG/ML  SOLN COMPARISON:  None. FINDINGS: Lower chest: No acute abnormality. Hepatobiliary: No suspicious hepatic lesion. Gallbladder  is decompressed. No biliary ductal dilation. Pancreas: No pancreatic ductal dilation or evidence of acute inflammation. Spleen: Normal in size without focal abnormality. Adrenals/Urinary Tract: Bilateral adrenal glands appear normal. No hydronephrosis. Hypodense subcentimeter left interpolar renal lesion is technically too small to accurately characterize  but statistically likely reflect cysts. Kidneys demonstrate symmetric enhancement. Urinary bladder is unremarkable for degree of distension. Stomach/Bowel: Radiopaque enteric contrast material traverses the ascending colon. Stomach is unremarkable for degree of distension. No pathologic dilation of small or large bowel. Terminal ileum appears normal. The appendix is not confidently identified however there is no pericecal inflammation. Left-sided colonic diverticulosis without findings of acute diverticulitis. Vascular/Lymphatic: No significant vascular findings are present. No enlarged abdominal or pelvic lymph nodes. Reproductive: Prostate is unremarkable. Other: No abdominal wall hernia or abnormality. No abdominopelvic ascites. Musculoskeletal: Left proximal femoral enchondroma. No acute osseous abnormality or aggressive lytic or blastic lesion of bone. IMPRESSION: 1. No acute abdominopelvic findings. 2. Left-sided colonic diverticulosis without findings of acute diverticulitis. Electronically Signed   By: Dahlia Bailiff M.D.   On: 02/22/2021 12:43   US Venous Img Lower Bilateral (DVT)  Result Date: 02/21/2021 CLINICAL DATA:  Bilateral lower extremity pain. EXAM: BILATERAL LOWER EXTREMITY VENOUS DOPPLER ULTRASOUND TECHNIQUE: Gray-scale sonography with graded compression, as well as color Doppler and duplex ultrasound were performed to evaluate the lower extremity deep venous systems from the level of the common femoral vein and including the common femoral, femoral, profunda femoral, popliteal and calf veins including the posterior tibial, peroneal and gastrocnemius veins when visible. The superficial great saphenous vein was also interrogated. Spectral Doppler was utilized to evaluate flow at rest and with distal augmentation maneuvers in the common femoral, femoral and popliteal veins. COMPARISON:  Chest XR, concurrent. FINDINGS: RIGHT LOWER EXTREMITY VENOUS Normal compressibility of the RIGHT common  femoral, superficial femoral, and popliteal veins, as well as the visualized calf veins. Visualized portions of profunda femoral vein and great saphenous vein unremarkable. No filling defects to suggest DVT on grayscale or color Doppler imaging. Doppler waveforms show normal direction of venous flow, normal respiratory plasticity and response to augmentation. OTHER No evidence of superficial thrombophlebitis or abnormal fluid collection. Limitations: none LEFT LOWER EXTREMITY VENOUS Normal compressibility of the LEFT common femoral, superficial femoral, and popliteal veins, as well as the visualized calf veins. Visualized portions of profunda femoral vein and great saphenous vein unremarkable. No filling defects to suggest DVT on grayscale or color Doppler imaging. Doppler waveforms show normal direction of venous flow, normal respiratory plasticity and response to augmentation. OTHER No evidence of superficial thrombophlebitis or abnormal fluid collection. Limitations: none IMPRESSION: No evidence of femoropopliteal DVT within either lower extremity. Michaelle Birks, MD [facility] Electronically Signed   By: Michaelle Birks M.D.   On: 02/21/2021 14:24   DG Chest Port 1 View  Result Date: 02/20/2021 CLINICAL DATA:  Chest pain. EXAM: PORTABLE CHEST 1 VIEW COMPARISON:  None. FINDINGS: The heart size and mediastinal contours are within normal limits. There is a 5 mm nodular density projecting over the left lower lung. The lungs are otherwise clear. No acute fractures are seen. IMPRESSION: 1. No acute cardiopulmonary process. 2. Nodular density projecting over the left lower lung, indeterminate. Recommend follow-up nonemergent chest CT. Electronically Signed   By: Ronney Asters M.D.   On: 02/20/2021 20:33   ECHOCARDIOGRAM COMPLETE  Result Date: 02/21/2021    ECHOCARDIOGRAM REPORT   Patient Name:   Teague TYROME WHITEN Date of Exam:  02/21/2021 Medical Rec #:  [id number]      Height:       68.0 in Accession #:    [id number]    Weight:       146.3 lb Date of Birth:  06-21-1980      BSA:          1.789 m Patient Age:    41 years      BP:           139/96 mmHg Patient Gender: M             HR:           103 bpm. Exam Location:  [facility] Procedure: 2D Echo, Cardiac Doppler and Color Doppler Indications:    Chest Pain  History:        Patient has no prior history of Echocardiogram examinations.                 Arrythmias:Tachycardia.  Sonographer:    Wenda Low Referring Phys: [phone] Chynah Orihuela IMPRESSIONS  1. Left ventricular ejection fraction, by estimation, is 50 to 55%. The left ventricle has low normal function. The left ventricle has no regional wall motion abnormalities. There is mild left ventricular hypertrophy. Left ventricular diastolic parameters were normal.  2. Right ventricular systolic function is normal. The right ventricular size is normal. There is normal pulmonary artery systolic pressure. The estimated right ventricular systolic pressure is 0000000 mmHg.  3. The mitral valve is grossly normal. Trivial mitral valve regurgitation.  4. The aortic valve is tricuspid. Aortic valve regurgitation is not visualized. No aortic stenosis is present. Aortic valve mean gradient measures 3.0 mmHg.  5. The inferior vena cava is normal in size with greater than 50% respiratory variability, suggesting right atrial pressure of 3 mmHg. Comparison(s): No prior Echocardiogram. FINDINGS  Left Ventricle: Left ventricular ejection fraction, by estimation, is 50 to 55%. The left ventricle has low normal function. The left ventricle has no regional wall motion abnormalities. The left ventricular internal cavity size was normal in size. There is mild left ventricular hypertrophy. Left ventricular diastolic parameters were normal. Right Ventricle: The right ventricular size is normal. No increase in right ventricular wall thickness. Right ventricular systolic function is normal. There is normal  pulmonary artery systolic pressure. The tricuspid regurgitant velocity is 2.29 m/s, and  with an assumed right atrial pressure of 3 mmHg, the estimated right ventricular systolic pressure is 0000000 mmHg. Left Atrium: Left atrial size was normal in size. Right Atrium: Right atrial size was normal in size. Pericardium: There is no evidence of pericardial effusion. Mitral Valve: The mitral valve is grossly normal. Trivial mitral valve regurgitation. MV peak gradient, 3.9 mmHg. The mean mitral valve gradient is 1.0 mmHg. Tricuspid Valve: The tricuspid valve is grossly normal. Tricuspid valve regurgitation is mild. Aortic Valve: The aortic valve is tricuspid. Aortic valve regurgitation is not visualized. No aortic stenosis is present. Aortic valve mean gradient measures 3.0 mmHg. Aortic valve peak gradient measures 6.5 mmHg. Aortic valve area, by VTI measures 3.33 cm. Pulmonic Valve: The pulmonic valve was grossly normal. Pulmonic valve regurgitation is trivial. Aorta: The aortic root is normal in size and structure. Venous: The inferior vena cava is normal in size with greater than 50% respiratory variability, suggesting right atrial pressure of 3 mmHg. IAS/Shunts: No atrial level shunt detected by color flow Doppler.  LEFT VENTRICLE PLAX 2D LVIDd:         4.00 cm  Diastology LVIDs:         3.00 cm     LV e' medial:    9.57 cm/s LV PW:         1.40 cm     LV E/e' medial:  6.9 LV IVS:        1.00 cm     LV e' lateral:   15.60 cm/s LVOT diam:     2.30 cm     LV E/e' lateral: 4.3 LV SV:         74 LV SV Index:   42 LVOT Area:     4.15 cm  LV Volumes (MOD) LV vol d, MOD A2C: 49.7 ml LV vol d, MOD A4C: 59.9 ml LV vol s, MOD A2C: 25.3 ml LV vol s, MOD A4C: 29.6 ml LV SV MOD A2C:     24.4 ml LV SV MOD A4C:     59.9 ml LV SV MOD BP:      26.6 ml RIGHT VENTRICLE RV Basal diam:  4.00 cm RV Mid diam:    2.90 cm RV S prime:     15.20 cm/s TAPSE (M-mode): 2.4 cm LEFT ATRIUM             Index        RIGHT ATRIUM           Index LA  diam:        2.20 cm 1.23 cm/m   RA Area:     14.00 cm LA Vol (A2C):   44.0 ml 24.59 ml/m  RA Volume:   35.30 ml  19.73 ml/m LA Vol (A4C):   34.8 ml 19.45 ml/m LA Biplane Vol: 40.7 ml 22.74 ml/m  AORTIC VALVE                    PULMONIC VALVE AV Area (Vmax):    3.47 cm     PV Vmax:       0.68 m/s AV Area (Vmean):   2.94 cm     PV Peak grad:  1.8 mmHg AV Area (VTI):     3.33 cm AV Vmax:           127.00 cm/s AV Vmean:          80.300 cm/s AV VTI:            0.223 m AV Peak Grad:      6.5 mmHg AV Mean Grad:      3.0 mmHg LVOT Vmax:         106.00 cm/s LVOT Vmean:        56.900 cm/s LVOT VTI:          0.179 m LVOT/AV VTI ratio: 0.80  AORTA Ao Root diam: 3.20 cm MITRAL VALVE               TRICUSPID VALVE MV Area (PHT): 5.93 cm    TR Peak grad:   21.0 mmHg MV Area VTI:   4.37 cm    TR Vmax:        229.00 cm/s MV Peak grad:  3.9 mmHg MV Mean grad:  1.0 mmHg    SHUNTS MV Vmax:       0.99 m/s    Systemic VTI:  0.18 m MV Vmean:      51.9 cm/s   Systemic Diam: 2.30 cm MV Decel Time: 128 msec MV E velocity: 66.40 cm/s MV A velocity: 78.80 cm/s MV E/A ratio:  0.84 [name]  MD Electronically signed by [name] MD Signature Date/Time: 02/21/2021/4:02:28 PM    Final         Discharge Exam: Vitals:   02/25/21 0550 02/25/21 0945  BP: (!) 93/57 127/76  Pulse: 78 (!) 113  Resp: 16 18  Temp: 98.1 F (36.7 C)   SpO2: 99% 99%   Vitals:   02/24/21 1421 02/24/21 2051 02/25/21 0550 02/25/21 0945  BP: 122/85 125/73 (!) 93/57 127/76  Pulse: (!) 103 98 78 (!) 113  Resp: 18 16 16 18   Temp: 99.1 F (37.3 C) (!) 97.2 F (36.2 C) 98.1 F (36.7 C)   TempSrc: Oral     SpO2: 100% 98% 99% 99%  Weight:      Height:        General: Pt is alert, awake, not in acute distress Cardiovascular: RRR, S1/S2 +, no rubs, no gallops Respiratory: CTA bilaterally, no wheezing, no rhonchi Abdominal: Soft, NT, ND, bowel sounds + Extremities: no edema, no cyanosis   The results of significant diagnostics  from this hospitalization (including imaging, microbiology, ancillary and laboratory) are listed below for reference.    Significant Diagnostic Studies: CT ABDOMEN PELVIS W CONTRAST  Result Date: 02/22/2021 CLINICAL DATA:  Mid abdominal pain times several weeks EXAM: CT ABDOMEN AND PELVIS WITH CONTRAST TECHNIQUE: Multidetector CT imaging of the abdomen and pelvis was performed using the standard protocol following bolus administration of intravenous contrast. RADIATION DOSE REDUCTION: This exam was performed according to the departmental dose-optimization program which includes automated exposure control, adjustment of the mA and/or kV according to patient size and/or use of iterative reconstruction technique. CONTRAST:  123mL OMNIPAQUE IOHEXOL 300 MG/ML  SOLN COMPARISON:  None. FINDINGS: Lower chest: No acute abnormality. Hepatobiliary: No suspicious hepatic lesion. Gallbladder is decompressed. No biliary ductal dilation. Pancreas: No pancreatic ductal dilation or evidence of acute inflammation. Spleen: Normal in size without focal abnormality. Adrenals/Urinary Tract: Bilateral adrenal glands appear normal. No hydronephrosis. Hypodense subcentimeter left interpolar renal lesion is technically too small to accurately characterize but statistically likely reflect cysts. Kidneys demonstrate symmetric enhancement. Urinary bladder is unremarkable for degree of distension. Stomach/Bowel: Radiopaque enteric contrast material traverses the ascending colon. Stomach is unremarkable for degree of distension. No pathologic dilation of small or large bowel. Terminal ileum appears normal. The appendix is not confidently identified however there is no pericecal inflammation. Left-sided colonic diverticulosis without findings of acute diverticulitis. Vascular/Lymphatic: No significant vascular findings are present. No enlarged abdominal or pelvic lymph nodes. Reproductive: Prostate is unremarkable. Other: No abdominal wall  hernia or abnormality. No abdominopelvic ascites. Musculoskeletal: Left proximal femoral enchondroma. No acute osseous abnormality or aggressive lytic or blastic lesion of bone. IMPRESSION: 1. No acute abdominopelvic findings. 2. Left-sided colonic diverticulosis without findings of acute diverticulitis. Electronically Signed   By: Dahlia Bailiff M.D.   On: 02/22/2021 12:43   US Venous Img Lower Bilateral (DVT)  Result Date: 02/21/2021 CLINICAL DATA:  Bilateral lower extremity pain. EXAM: BILATERAL LOWER EXTREMITY VENOUS DOPPLER ULTRASOUND TECHNIQUE: Gray-scale sonography with graded compression, as well as color Doppler and duplex ultrasound were performed to evaluate the lower extremity deep venous systems from the level of the common femoral vein and including the common femoral, femoral, profunda femoral, popliteal and calf veins including the posterior tibial, peroneal and gastrocnemius veins when visible. The superficial great saphenous vein was also interrogated. Spectral Doppler was utilized to evaluate flow at rest and with distal augmentation maneuvers in the common femoral, femoral and popliteal veins. COMPARISON:  Chest  XR, concurrent. FINDINGS: RIGHT LOWER EXTREMITY VENOUS Normal compressibility of the RIGHT common femoral, superficial femoral, and popliteal veins, as well as the visualized calf veins. Visualized portions of profunda femoral vein and great saphenous vein unremarkable. No filling defects to suggest DVT on grayscale or color Doppler imaging. Doppler waveforms show normal direction of venous flow, normal respiratory plasticity and response to augmentation. OTHER No evidence of superficial thrombophlebitis or abnormal fluid collection. Limitations: none LEFT LOWER EXTREMITY VENOUS Normal compressibility of the LEFT common femoral, superficial femoral, and popliteal veins, as well as the visualized calf veins. Visualized portions of profunda femoral vein and great saphenous vein  unremarkable. No filling defects to suggest DVT on grayscale or color Doppler imaging. Doppler waveforms show normal direction of venous flow, normal respiratory plasticity and response to augmentation. OTHER No evidence of superficial thrombophlebitis or abnormal fluid collection. Limitations: none IMPRESSION: No evidence of femoropopliteal DVT within either lower extremity. Michaelle Birks, MD [facility] Electronically Signed   By: Michaelle Birks M.D.   On: 02/21/2021 14:24   DG Chest Port 1 View  Result Date: 02/20/2021 CLINICAL DATA:  Chest pain. EXAM: PORTABLE CHEST 1 VIEW COMPARISON:  None. FINDINGS: The heart size and mediastinal contours are within normal limits. There is a 5 mm nodular density projecting over the left lower lung. The lungs are otherwise clear. No acute fractures are seen. IMPRESSION: 1. No acute cardiopulmonary process. 2. Nodular density projecting over the left lower lung, indeterminate. Recommend follow-up nonemergent chest CT. Electronically Signed   By: Ronney Asters M.D.   On: 02/20/2021 20:33   ECHOCARDIOGRAM COMPLETE  Result Date: 02/21/2021    ECHOCARDIOGRAM REPORT   Patient Name:   EZANA BLOMME Date of Exam: 02/21/2021 Medical Rec #:  [id number]     Height:       68.0 in Accession #:    [id number]    Weight:       146.3 lb Date of Birth:  April 20, 1980      BSA:          1.789 m Patient Age:    62 years      BP:           139/96 mmHg Patient Gender: M             HR:           103 bpm. Exam Location:  [facility] Procedure: 2D Echo, Cardiac Doppler and Color Doppler Indications:    Chest Pain  History:        Patient has no prior history of Echocardiogram examinations.                 Arrythmias:Tachycardia.  Sonographer:    Wenda Low Referring Phys: [phone] Destyn Schuyler IMPRESSIONS  1. Left ventricular ejection fraction, by estimation, is 50 to 55%. The left ventricle has low normal function. The left ventricle has no  regional wall motion abnormalities. There is mild left ventricular hypertrophy. Left ventricular diastolic parameters were normal.  2. Right ventricular systolic function is normal. The right ventricular size is normal. There is normal pulmonary artery systolic pressure. The estimated right ventricular systolic pressure is 0000000 mmHg.  3. The mitral valve is grossly normal. Trivial mitral valve regurgitation.  4. The aortic valve is tricuspid. Aortic valve regurgitation is not visualized. No aortic stenosis is present. Aortic valve mean gradient measures 3.0 mmHg.  5. The inferior vena cava is normal in size with greater  than 50% respiratory variability, suggesting right atrial pressure of 3 mmHg. Comparison(s): No prior Echocardiogram. FINDINGS  Left Ventricle: Left ventricular ejection fraction, by estimation, is 50 to 55%. The left ventricle has low normal function. The left ventricle has no regional wall motion abnormalities. The left ventricular internal cavity size was normal in size. There is mild left ventricular hypertrophy. Left ventricular diastolic parameters were normal. Right Ventricle: The right ventricular size is normal. No increase in right ventricular wall thickness. Right ventricular systolic function is normal. There is normal pulmonary artery systolic pressure. The tricuspid regurgitant velocity is 2.29 m/s, and  with an assumed right atrial pressure of 3 mmHg, the estimated right ventricular systolic pressure is 24.0 mmHg. Left Atrium: Left atrial size was normal in size. Right Atrium: Right atrial size was normal in size. Pericardium: There is no evidence of pericardial effusion. Mitral Valve: The mitral valve is grossly normal. Trivial mitral valve regurgitation. MV peak gradient, 3.9 mmHg. The mean mitral valve gradient is 1.0 mmHg. Tricuspid Valve: The tricuspid valve is grossly normal. Tricuspid valve regurgitation is mild. Aortic Valve: The aortic valve is tricuspid. Aortic valve  regurgitation is not visualized. No aortic stenosis is present. Aortic valve mean gradient measures 3.0 mmHg. Aortic valve peak gradient measures 6.5 mmHg. Aortic valve area, by VTI measures 3.33 cm. Pulmonic Valve: The pulmonic valve was grossly normal. Pulmonic valve regurgitation is trivial. Aorta: The aortic root is normal in size and structure. Venous: The inferior vena cava is normal in size with greater than 50% respiratory variability, suggesting right atrial pressure of 3 mmHg. IAS/Shunts: No atrial level shunt detected by color flow Doppler.  LEFT VENTRICLE PLAX 2D LVIDd:         4.00 cm     Diastology LVIDs:         3.00 cm     LV e' medial:    9.57 cm/s LV PW:         1.40 cm     LV E/e' medial:  6.9 LV IVS:        1.00 cm     LV e' lateral:   15.60 cm/s LVOT diam:     2.30 cm     LV E/e' lateral: 4.3 LV SV:         74 LV SV Index:   42 LVOT Area:     4.15 cm  LV Volumes (MOD) LV vol d, MOD A2C: 49.7 ml LV vol d, MOD A4C: 59.9 ml LV vol s, MOD A2C: 25.3 ml LV vol s, MOD A4C: 29.6 ml LV SV MOD A2C:     24.4 ml LV SV MOD A4C:     59.9 ml LV SV MOD BP:      26.6 ml RIGHT VENTRICLE RV Basal diam:  4.00 cm RV Mid diam:    2.90 cm RV S prime:     15.20 cm/s TAPSE (M-mode): 2.4 cm LEFT ATRIUM             Index        RIGHT ATRIUM           Index LA diam:        2.20 cm 1.23 cm/m   RA Area:     14.00 cm LA Vol (A2C):   44.0 ml 24.59 ml/m  RA Volume:   35.30 ml  19.73 ml/m LA Vol (A4C):   34.8 ml 19.45 ml/m LA Biplane Vol: 40.7 ml 22.74 ml/m  AORTIC VALVE  PULMONIC VALVE AV Area (Vmax):    3.47 cm     PV Vmax:       0.68 m/s AV Area (Vmean):   2.94 cm     PV Peak grad:  1.8 mmHg AV Area (VTI):     3.33 cm AV Vmax:           127.00 cm/s AV Vmean:          80.300 cm/s AV VTI:            0.223 m AV Peak Grad:      6.5 mmHg AV Mean Grad:      3.0 mmHg LVOT Vmax:         106.00 cm/s LVOT Vmean:        56.900 cm/s LVOT VTI:          0.179 m LVOT/AV VTI ratio: 0.80  AORTA Ao Root diam: 3.20  cm MITRAL VALVE               TRICUSPID VALVE MV Area (PHT): 5.93 cm    TR Peak grad:   21.0 mmHg MV Area VTI:   4.37 cm    TR Vmax:        229.00 cm/s MV Peak grad:  3.9 mmHg MV Mean grad:  1.0 mmHg    SHUNTS MV Vmax:       0.99 m/s    Systemic VTI:  0.18 m MV Vmean:      51.9 cm/s   Systemic Diam: 2.30 cm MV Decel Time: 128 msec MV E velocity: 66.40 cm/s MV A velocity: 78.80 cm/s MV E/A ratio:  0.84 [name] MD Electronically signed by [name] MD Signature Date/Time: 02/21/2021/4:02:28 PM    Final     Microbiology: Recent Results (from the past 240 hour(s))  Urine Culture     Status: Abnormal   Collection Time: 02/20/21  9:03 PM   Specimen: Urine, Clean Catch  Result Value Ref Range Status   Specimen Description   Final    URINE, CLEAN CATCH Performed at [facility], [address], [place], [place] [address]    Special Requests   Final    NONE Performed at [facility], [address], [place], [place] [address]    Culture (A)  Final    <10,000 COLONIES/mL INSIGNIFICANT GROWTH Performed at [facility] [address] [place], [place] [address]    Report Status 02/22/2021 FINAL  Final  Blood Culture (routine x 2)     Status: None (Preliminary result)   Collection Time: 02/20/21  9:07 PM   Specimen: Right Antecubital; Blood  Result Value Ref Range Status   Specimen Description RIGHT ANTECUBITAL  Final   Special Requests   Final    BOTTLES DRAWN AEROBIC AND ANAEROBIC Blood Culture results may not be optimal due to an excessive volume of blood received in culture bottles   Culture   Final    NO GROWTH 4 DAYS Performed at [facility], [address], [place],  [address]    Report Status PENDING  Incomplete  Blood Culture (routine x 2)     Status: None (Preliminary result)   Collection Time: 02/20/21  9:07 PM   Specimen: BLOOD RIGHT HAND  Result Value Ref Range Status   Specimen Description BLOOD RIGHT HAND  Final   Special Requests   Final    BOTTLES  DRAWN AEROBIC ONLY Blood Culture results may not be optimal due to an inadequate  volume of blood received in culture bottles   Culture   Final    NO GROWTH 4 DAYS Performed at [facility], [address] [place], [place] [address]    Report Status PENDING  Incomplete  Resp Panel by RT-PCR (Flu A&B, Covid) Nasopharyngeal Swab     Status: None   Collection Time: 02/20/21  9:31 PM   Specimen: Nasopharyngeal Swab; Nasopharyngeal(NP) swabs in vial transport medium  Result Value Ref Range Status   SARS Coronavirus 2 by RT PCR NEGATIVE NEGATIVE Final    Comment: (NOTE) SARS-CoV-2 target nucleic acids are NOT DETECTED.  The SARS-CoV-2 RNA is generally detectable in upper respiratory specimens during the acute phase of infection. The lowest concentration of SARS-CoV-2 viral copies this assay can detect is 138 copies/mL. A negative result does not preclude SARS-Cov-2 infection and should not be used as the sole basis for treatment or other patient management decisions. A negative result may occur with  improper specimen collection/handling, submission of specimen other than nasopharyngeal swab, presence of viral mutation(s) within the areas targeted by this assay, and inadequate number of viral copies(<138 copies/mL). A negative result must be combined with clinical observations, patient history, and epidemiological information. The expected result is Negative.  Fact Sheet for Patients:  EntrepreneurPulse.com.au  Fact Sheet for Healthcare Providers:  IncredibleEmployment.be  This test is no t yet approved or cleared by the [place] FDA and  has been authorized for detection and/or diagnosis of SARS-CoV-2 by FDA under an Emergency Use Authorization (EUA). This EUA will remain  in effect (meaning this test can be used) for the duration of the COVID-19 declaration under Section 564(b)(1) of the Act, 21 U.S.C.section 360bbb-3(b)(1), unless the  authorization is terminated  or revoked sooner.       Influenza A by PCR NEGATIVE NEGATIVE Final   Influenza B by PCR NEGATIVE NEGATIVE Final    Comment: (NOTE) The Xpert Xpress SARS-CoV-2/FLU/RSV plus assay is intended as an aid in the diagnosis of influenza from Nasopharyngeal swab specimens and should not be used as a sole basis for treatment. Nasal washings and aspirates are unacceptable for Xpert Xpress SARS-CoV-2/FLU/RSV testing.  Fact Sheet for Patients: EntrepreneurPulse.com.au  Fact Sheet for Healthcare Providers: IncredibleEmployment.be  This test is not yet approved or cleared by the [place] FDA and has been authorized for detection and/or diagnosis of SARS-CoV-2 by FDA under an Emergency Use Authorization (EUA). This EUA will remain in effect (meaning this test can be used) for the duration of the COVID-19 declaration under Section 564(b)(1) of the Act, 21 U.S.C. section 360bbb-3(b)(1), unless the authorization is terminated or revoked.  Performed at [facility], [address] [place], [place] [address]      Labs: Basic Metabolic Panel: Recent Labs  Lab 02/20/21 1926 02/21/21 0442 02/22/21 0500 02/24/21 0518  NA 137 138 136 142  K 3.9 3.6 3.3* 3.2*  CL 100 103 110 110  CO2 27 25 21* 25  GLUCOSE 158* 109* 115* 109*  BUN 8 7 11 13   CREATININE 0.68 0.59* 0.70 0.76  CALCIUM 9.4 9.8 8.5* 9.1  MG  --  2.2 2.1 2.3  PHOS  --  3.4  --   --    Liver Function Tests: Recent Labs  Lab 02/20/21 1926 02/21/21 0442 02/22/21 1120  AST 19 15 13*  ALT 20 16 15   ALKPHOS 80 74 66  BILITOT 0.3 0.8 0.8  PROT 7.5 6.9 6.3*  ALBUMIN 4.4 4.0 3.5   Recent Labs  Lab 02/20/21 1926  LIPASE 28   Recent Labs  Lab 02/22/21 1120  AMMONIA 23   CBC: Recent Labs  Lab 02/20/21 1926 02/21/21 0442 02/22/21 0500  WBC 17.2* 10.7* 8.9  NEUTROABS 14.1*  --   --   HGB 14.4 12.9* 13.6  HCT 42.0 38.5* 39.5  MCV 85.5 84.1 85.1   PLT 428* 347 322   Cardiac Enzymes: Recent Labs  Lab 02/20/21 1926  CKTOTAL 58   BNP: Invalid input(s): POCBNP CBG: No results for input(s): GLUCAP in the last 168 hours.  Time coordinating discharge:  36 minutes  Signed:  Orson Eva, DO [facility] Pager: [phone] 02/25/2021, 10:58 AM

## 2021-02-25 NOTE — Progress Notes (Signed)
°   02/25/21 0945  Assess: MEWS Score  BP 127/76  Pulse Rate (!) 113  Resp 18  Level of Consciousness Alert  SpO2 99 %  O2 Device Room Air  Assess: MEWS Score  MEWS Temp 0  MEWS Systolic 0  MEWS Pulse 2  MEWS RR 0  MEWS LOC 0  MEWS Score 2  MEWS Score Color Yellow  Assess: if the MEWS score is Yellow or Red  Were vital signs taken at a resting state? Yes  Focused Assessment No change from prior assessment  Early Detection of Sepsis Score *See Row Information* Low  Treat  Pain Scale 0-10  Pain Score 0  Take Vital Signs  Increase Vital Sign Frequency  Yellow: Q 2hr X 2 then Q 4hr X 2, if remains yellow, continue Q 4hrs  Notify: Charge Nurse/RN  Name of Charge Nurse/RN Notified Zenaida Deed RN  Date Charge Nurse/RN Notified 02/25/21  Time Charge Nurse/RN Notified 1007  Notify: Provider  Provider Name/Title Dr Tat  Date Provider Notified 02/25/21  Time Provider Notified 1003  Notification Type Page  Notification Reason Other (Comment) (hr 113)  Date of Provider Response 02/25/21   No changes.

## 2021-02-26 LAB — CULTURE, BLOOD (ROUTINE X 2)
Culture: NO GROWTH
Culture: NO GROWTH

## 2021-03-19 ENCOUNTER — Encounter (HOSPITAL_COMMUNITY): Payer: Self-pay | Admitting: Radiology

## 2021-11-08 ENCOUNTER — Encounter (HOSPITAL_COMMUNITY): Payer: Self-pay | Admitting: Emergency Medicine

## 2021-11-08 ENCOUNTER — Inpatient Hospital Stay (HOSPITAL_COMMUNITY)
Admission: EM | Admit: 2021-11-08 | Discharge: 2021-11-12 | DRG: 896 | Disposition: A | Discharge status: Other Acute Inpatient Hospital | Payer: Self-pay | Attending: Internal Medicine | Admitting: Internal Medicine

## 2021-11-08 ENCOUNTER — Other Ambulatory Visit: Payer: Self-pay

## 2021-11-08 DIAGNOSIS — Z87891 Personal history of nicotine dependence: Secondary | ICD-10-CM

## 2021-11-08 DIAGNOSIS — Z1152 Encounter for screening for COVID-19: Secondary | ICD-10-CM

## 2021-11-08 DIAGNOSIS — K2970 Gastritis, unspecified, without bleeding: Secondary | ICD-10-CM | POA: Diagnosis present

## 2021-11-08 DIAGNOSIS — K226 Gastro-esophageal laceration-hemorrhage syndrome: Secondary | ICD-10-CM | POA: Diagnosis present

## 2021-11-08 DIAGNOSIS — F1413 Cocaine abuse, unspecified with withdrawal: Principal | ICD-10-CM | POA: Diagnosis present

## 2021-11-08 DIAGNOSIS — F23 Brief psychotic disorder: Secondary | ICD-10-CM | POA: Diagnosis present

## 2021-11-08 DIAGNOSIS — Z79899 Other long term (current) drug therapy: Secondary | ICD-10-CM

## 2021-11-08 DIAGNOSIS — F419 Anxiety disorder, unspecified: Secondary | ICD-10-CM | POA: Diagnosis present

## 2021-11-08 DIAGNOSIS — I471 Supraventricular tachycardia, unspecified: Secondary | ICD-10-CM | POA: Diagnosis present

## 2021-11-08 DIAGNOSIS — F209 Schizophrenia, unspecified: Secondary | ICD-10-CM | POA: Diagnosis present

## 2021-11-08 DIAGNOSIS — F1910 Other psychoactive substance abuse, uncomplicated: Secondary | ICD-10-CM | POA: Diagnosis present

## 2021-11-08 DIAGNOSIS — F22 Delusional disorders: Secondary | ICD-10-CM | POA: Diagnosis present

## 2021-11-08 DIAGNOSIS — E0590 Thyrotoxicosis, unspecified without thyrotoxic crisis or storm: Secondary | ICD-10-CM | POA: Diagnosis present

## 2021-11-08 DIAGNOSIS — Z791 Long term (current) use of non-steroidal anti-inflammatories (NSAID): Secondary | ICD-10-CM

## 2021-11-08 DIAGNOSIS — D72829 Elevated white blood cell count, unspecified: Secondary | ICD-10-CM | POA: Diagnosis present

## 2021-11-08 DIAGNOSIS — M6282 Rhabdomyolysis: Secondary | ICD-10-CM | POA: Diagnosis present

## 2021-11-08 DIAGNOSIS — Z885 Allergy status to narcotic agent status: Secondary | ICD-10-CM

## 2021-11-08 DIAGNOSIS — R112 Nausea with vomiting, unspecified: Secondary | ICD-10-CM | POA: Diagnosis present

## 2021-11-08 DIAGNOSIS — Z880 Allergy status to penicillin: Secondary | ICD-10-CM

## 2021-11-08 DIAGNOSIS — K920 Hematemesis: Secondary | ICD-10-CM

## 2021-11-08 DIAGNOSIS — E860 Dehydration: Secondary | ICD-10-CM | POA: Diagnosis present

## 2021-11-08 DIAGNOSIS — N179 Acute kidney failure, unspecified: Secondary | ICD-10-CM | POA: Diagnosis present

## 2021-11-08 DIAGNOSIS — E876 Hypokalemia: Secondary | ICD-10-CM | POA: Diagnosis present

## 2021-11-08 DIAGNOSIS — F19959 Other psychoactive substance use, unspecified with psychoactive substance-induced psychotic disorder, unspecified: Secondary | ICD-10-CM | POA: Diagnosis present

## 2021-11-08 DIAGNOSIS — Z91148 Patient's other noncompliance with medication regimen for other reason: Secondary | ICD-10-CM

## 2021-11-08 HISTORY — DX: Tachycardia, unspecified: R00.0

## 2021-11-08 HISTORY — DX: Thyrotoxicosis, unspecified without thyrotoxic crisis or storm: E05.90

## 2021-11-08 HISTORY — DX: Sepsis, unspecified organism: A41.9

## 2021-11-08 LAB — CBC WITH DIFFERENTIAL/PLATELET
Abs Immature Granulocytes: 0.02 10*3/uL (ref 0.00–0.07)
Basophils Absolute: 0 10*3/uL (ref 0.0–0.1)
Basophils Relative: 0 %
Eosinophils Absolute: 0.1 10*3/uL (ref 0.0–0.5)
Eosinophils Relative: 1 %
HCT: 40.7 % (ref 39.0–52.0)
Hemoglobin: 14.1 g/dL (ref 13.0–17.0)
Immature Granulocytes: 0 %
Lymphocytes Relative: 13 %
Lymphs Abs: 1.2 10*3/uL (ref 0.7–4.0)
MCH: 27.9 pg (ref 26.0–34.0)
MCHC: 34.6 g/dL (ref 30.0–36.0)
MCV: 80.6 fL (ref 80.0–100.0)
Monocytes Absolute: 0.5 10*3/uL (ref 0.1–1.0)
Monocytes Relative: 6 %
Neutro Abs: 7.4 10*3/uL (ref 1.7–7.7)
Neutrophils Relative %: 80 %
Platelets: 300 10*3/uL (ref 150–400)
RBC: 5.05 MIL/uL (ref 4.22–5.81)
RDW: 12.9 % (ref 11.5–15.5)
WBC: 9.3 10*3/uL (ref 4.0–10.5)
nRBC: 0 % (ref 0.0–0.2)

## 2021-11-08 LAB — COMPREHENSIVE METABOLIC PANEL
ALT: 17 U/L (ref 0–44)
AST: 15 U/L (ref 15–41)
Albumin: 4.3 g/dL (ref 3.5–5.0)
Alkaline Phosphatase: 82 U/L (ref 38–126)
Anion gap: 9 (ref 5–15)
BUN: 14 mg/dL (ref 6–20)
CO2: 25 mmol/L (ref 22–32)
Calcium: 9.1 mg/dL (ref 8.9–10.3)
Chloride: 106 mmol/L (ref 98–111)
Creatinine, Ser: 0.93 mg/dL (ref 0.61–1.24)
GFR, Estimated: >60 mL/min (ref >60)
Glucose, Bld: 102 mg/dL — ABNORMAL HIGH (ref 70–99)
Potassium: 3.3 mmol/L — ABNORMAL LOW (ref 3.5–5.1)
Sodium: 140 mmol/L (ref 135–145)
Total Bilirubin: 0.9 mg/dL (ref 0.3–1.2)
Total Protein: 7.4 g/dL (ref 6.5–8.1)

## 2021-11-08 MED ORDER — ZIPRASIDONE MESYLATE 20 MG IM SOLR
20.0000 mg | Freq: Once | INTRAMUSCULAR | Status: AC
  Administered 2021-11-08: 20 mg via INTRAMUSCULAR
  Filled 2021-11-08: qty 20

## 2021-11-08 NOTE — ED Notes (Signed)
Pt came arrived to the ER in handcuffs with [facility] department. Pt educated on [employer] and given appropriate burgundy scrubs. Pt dressed out with [place] and sheriffs. Pt is corporative at this time. Pt has a hat, shirt, jogging pants, and shoes. All Pts belonging locked up on second shelf in secured locker room. Pt has been wanded by security as well. Pt is now resting in room. Nurse aware.

## 2021-11-08 NOTE — ED Notes (Signed)
Paperwork signed and notarized and faxed to [employer]

## 2021-11-08 NOTE — ED Notes (Signed)
Pt is aware of staff needing urine specimen.

## 2021-11-08 NOTE — ED Notes (Addendum)
Pt unable to answer triage questions appropriately, pt makes statements about his bank, "[place]" won't work with him even tho he has $31m in an account" Pt states he worked for [employer] and deposited all of his checks to get the money. Pt making statements about Daryll Brod; per deputy family states pt stopped taking all of his medication and refuses to be seen at [facility], security notified pt needs to be wanded

## 2021-11-08 NOTE — ED Notes (Signed)
Patient dressed out and possession obtained. Possessions are in designated room in bag with patent stickers. Pt has been searched by Event organiser and wanded by [facility] security.  Patient received medication to remain free from injury and free of injuring others. Pt is currently calm and cooperative.

## 2021-11-08 NOTE — BH Assessment (Signed)
TTS attempted to see the Pt., will follow up with Pt's nurse.

## 2021-11-08 NOTE — ED Provider Notes (Signed)
[facility] EMERGENCY DEPARTMENT Provider Note  CSN: [id number] Arrival date & time: 11/08/21 1141  Chief Complaint(s) V70.1  HPI CAELUM FEDERICI is a 41 y.o. male with PMH anxiety, polysubstance abuse, psychosis, schizophrenia who presents emergency department for evaluation of Abnormal aggressive behavior.  Patient arrives via [facility] after family called out due to aggressive abnormal behavior.  Family told deputies that patient has been refusing to take any of his psychiatric medications or return to [facility].  On arrival, patient screaming and making threatening statements to deputies as well as voicing active delusions.  Reacting to internal stimuli.  Denies SI or HI.   Past Medical History Past Medical History:  Diagnosis Date   Anxiety    Seizures ([place])    stress related; no more seizures since first one 10 years ago; on no meds.   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 Authorizing Provider  amoxicillin (AMOXIL) 500 MG capsule Take 500 mg by mouth 3 (three) times daily. 08/29/21   [provider]  citalopram (CELEXA) 20 MG tablet Take 1 tablet (20 mg total) by mouth daily. 02/25/21   Orson Eva, MD  gabapentin (NEURONTIN) 400 MG capsule Take 1 capsule (400 mg total) by mouth 3 (three) times daily. 02/25/21   Orson Eva, MD  haloperidol (HALDOL) 2 MG tablet Take 2 mg by mouth at bedtime. 07/23/21   [provider]  hydrOXYzine (VISTARIL) 25 MG capsule Take 25 mg by mouth 3 (three) times daily. 07/30/21   [provider]  metoprolol tartrate  (LOPRESSOR) 50 MG tablet Take 1 tablet (50 mg total) by mouth 2 (two) times daily. 02/25/21   Orson Eva, MD  OLANZapine (ZYPREXA) 10 MG tablet Take 1 tablet (10 mg total) by mouth at bedtime. 02/25/21   Orson Eva, MD  QUEtiapine (SEROQUEL) 100 MG tablet Take 1 tablet (100 mg total) by mouth daily. 02/25/21   Orson Eva, MD                                                                                                                                    Past Surgical History Past Surgical History:  Procedure Laterality Date   HEMORRHOID SURGERY N/A 11/12/2015   Procedure: EXTENSIVE HEMORRHOIDECTOMY;  Surgeon: Aviva Signs, MD;  Location: [facility];  Service: General;  Laterality: N/A;   Family History History reviewed. No pertinent family history.  Social History Social History   Tobacco Use   Smoking status: Former    Packs/day: 0.50    Years:  10.00    Total pack years: 5.00    Types: Cigarettes   Smokeless tobacco: Never  Vaping Use   Vaping Use: Every day  Substance Use Topics   Alcohol use: Yes    Comment: last drink 10/25/2019, pt said he had 3 beers   Drug use: Yes    Types: Heroin, Methamphetamines, Other-see comments    Comment: was using synthetic heroin daily for 2 years but stopped 2 months ago, methamphetamine daily for 3 months until June (last use 10/25/2019- "just a pinch"), "mushrooms yesterday," and "ketamine today"   Allergies Hydrocodone and Penicillins  Review of Systems Review of Systems  Psychiatric/Behavioral:  Positive for agitation and behavioral problems. The patient is hyperactive.     Physical Exam Vital Signs  I have reviewed the triage vital signs BP 122/81 (BP Location: Right Arm)   Pulse (!) 57   Temp 98.1 F (36.7 C) (Oral)   Resp 18   SpO2 94%   Physical Exam Constitutional:      General: He is not in acute distress.    Appearance: Normal appearance.  HENT:     Head: Normocephalic and atraumatic.     Nose: No congestion or rhinorrhea.   Eyes:     General:        Right eye: No discharge.        Left eye: No discharge.     Extraocular Movements: Extraocular movements intact.     Pupils: Pupils are equal, round, and reactive to light.  Cardiovascular:     Rate and Rhythm: Normal rate and regular rhythm.     Heart sounds: No murmur heard. Pulmonary:     Effort: No respiratory distress.     Breath sounds: No wheezing or rales.  Abdominal:     General: There is no distension.     Tenderness: There is no abdominal tenderness.  Musculoskeletal:        General: Normal range of motion.     Cervical back: Normal range of motion.  Skin:    General: Skin is warm and dry.  Neurological:     General: No focal deficit present.     Mental Status: He is alert.     ED Results and Treatments Labs (all labs ordered are listed, but only abnormal results are displayed) Labs Reviewed  RESP PANEL BY RT-PCR (FLU A&B, COVID) ARPGX2  COMPREHENSIVE METABOLIC PANEL  CBC WITH DIFFERENTIAL/PLATELET  URINALYSIS, ROUTINE W REFLEX MICROSCOPIC  RAPID URINE DRUG SCREEN, HOSP PERFORMED                                                                                                                          Radiology No results found.  Pertinent labs & imaging results that were available during my care of the patient were reviewed by me and considered in my medical decision making (see MDM for details).  Medications Ordered in ED Medications  ziprasidone (GEODON) injection 20 mg (20 mg Intramuscular  Given 11/08/21 1224)                                                                                                                                     Procedures .Critical Care  Performed by: Glendora Score, MD Authorized by: Glendora Score, MD   Critical care provider statement:    Critical care time (minutes):  30   Critical care was necessary to treat or prevent imminent or life-threatening deterioration of the following conditions:  Psychiatric emergency requiring chemical restraint.   Critical care was time spent personally by me on the following activities:  Development of treatment plan with patient or surrogate, discussions with consultants, evaluation of patient's response to treatment, examination of patient, ordering and review of laboratory studies, ordering and review of radiographic studies, ordering and performing treatments and interventions, pulse oximetry, re-evaluation of patient's condition and review of old charts   (including critical care time)  Medical Decision Making / ED Course   This patient presents to the ED for concern of aggressive behavior, this involves an extensive number of treatment options, and is a complaint that carries with it a high risk of complications and morbidity.  The differential diagnosis includes psychosis, medication noncompliance, polysubstance use, electrolyte abnormality, metabolic encephalopathy  MDM: Patient seen emergency room for evaluation of aggressive behavior, delusional behavior.  Patient yelling insults at police officers on arrival and displaying delusional psychotic behavior.  Patient required chemical restraint in order to obtain appropriate work-up.  IVC orders placed.  Initial laboratory evaluation unremarkable, UDS positive for cocaine and THC.  TTS consult placed and patient pending TTS evaluation at time of signout.  Please see provider signout note for continuation of work-up.   Additional history obtained:  -External records from outside source obtained and reviewed including: Chart review including previous notes, labs, imaging, consultation notes   Lab Tests: -I ordered, reviewed, and interpreted labs.   The pertinent results include:   Labs Reviewed  RESP PANEL BY RT-PCR (FLU A&B, COVID) ARPGX2  COMPREHENSIVE METABOLIC PANEL  CBC WITH DIFFERENTIAL/PLATELET  URINALYSIS, ROUTINE W REFLEX MICROSCOPIC  RAPID URINE DRUG SCREEN, HOSP PERFORMED      Medicines ordered and prescription drug management: Meds ordered this encounter  Medications   ziprasidone (GEODON) injection 20 mg    -I have reviewed the patients home medicines and have made adjustments as needed  Critical interventions Chemical restraint with Geodon  Consultations Obtained: I requested consultation with the TTS providers,  and discussed lab and imaging findings as well as pertinent plan - they recommend: Recommendations pending   Cardiac Monitoring: The patient was maintained on a cardiac monitor.  I personally viewed and interpreted the cardiac monitored which showed an underlying rhythm of: NSR  Social Determinants of Health:  Factors impacting patients care include: Not taking home psychiatric medications   Reevaluation: After the interventions noted above, I reevaluated the patient and found that they have :  improved  Co morbidities that complicate the patient evaluation  Past Medical History:  Diagnosis Date   Anxiety    Seizures ([facility])    stress related; no more seizures since first one 10 years ago; on no meds.      Dispostion: I considered admission for this patient, and disposition pending TTS evaluation     Final Clinical Impression(s) / ED Diagnoses Final diagnoses:  None     @PCDICTATION @    , MD 11/09/21 1150

## 2021-11-08 NOTE — ED Triage Notes (Signed)
Pt BIB [facility] after being called out by family for "talking out of his head and being slightly violent"; pt yelling during triage, making threatening statements to deputies, pt answers questions inappropriately, denies SI/HI

## 2021-11-09 ENCOUNTER — Encounter (HOSPITAL_COMMUNITY): Payer: Self-pay

## 2021-11-09 ENCOUNTER — Emergency Department (HOSPITAL_COMMUNITY): Payer: Self-pay

## 2021-11-09 DIAGNOSIS — F23 Brief psychotic disorder: Secondary | ICD-10-CM | POA: Diagnosis not present

## 2021-11-09 DIAGNOSIS — F1910 Other psychoactive substance abuse, uncomplicated: Secondary | ICD-10-CM | POA: Diagnosis not present

## 2021-11-09 DIAGNOSIS — R112 Nausea with vomiting, unspecified: Secondary | ICD-10-CM

## 2021-11-09 DIAGNOSIS — F22 Delusional disorders: Secondary | ICD-10-CM

## 2021-11-09 DIAGNOSIS — N179 Acute kidney failure, unspecified: Secondary | ICD-10-CM

## 2021-11-09 DIAGNOSIS — D72829 Elevated white blood cell count, unspecified: Secondary | ICD-10-CM

## 2021-11-09 DIAGNOSIS — K920 Hematemesis: Secondary | ICD-10-CM

## 2021-11-09 DIAGNOSIS — E876 Hypokalemia: Secondary | ICD-10-CM

## 2021-11-09 DIAGNOSIS — I471 Supraventricular tachycardia, unspecified: Secondary | ICD-10-CM | POA: Diagnosis present

## 2021-11-09 LAB — LIPASE, BLOOD: Lipase: 29 U/L (ref 11–51)

## 2021-11-09 LAB — URINALYSIS, ROUTINE W REFLEX MICROSCOPIC
Bacteria, UA: NONE SEEN
Bacteria, UA: NONE SEEN
Glucose, UA: NEGATIVE mg/dL
Glucose, UA: NEGATIVE mg/dL
Hgb urine dipstick: NEGATIVE
Hgb urine dipstick: NEGATIVE
Ketones, ur: 80 mg/dL — AB
Ketones, ur: 80 mg/dL — AB
Leukocytes,Ua: NEGATIVE
Leukocytes,Ua: NEGATIVE
Nitrite: NEGATIVE
Nitrite: NEGATIVE
Protein, ur: >=300 mg/dL — AB
Protein, ur: 100 mg/dL — AB
Specific Gravity, Urine: 1.033 — ABNORMAL HIGH (ref 1.005–1.030)
Specific Gravity, Urine: 1.029 (ref 1.005–1.030)
pH: 5 (ref 5.0–8.0)
pH: 5 (ref 5.0–8.0)

## 2021-11-09 LAB — CBC WITH DIFFERENTIAL/PLATELET
Abs Immature Granulocytes: 0.15 K/uL — ABNORMAL HIGH (ref 0.00–0.07)
Basophils Absolute: 0.1 K/uL (ref 0.0–0.1)
Basophils Relative: 0 %
Eosinophils Absolute: 0 K/uL (ref 0.0–0.5)
Eosinophils Relative: 0 %
HCT: 45.7 % (ref 39.0–52.0)
Hemoglobin: 15.9 g/dL (ref 13.0–17.0)
Immature Granulocytes: 1 %
Lymphocytes Relative: 5 %
Lymphs Abs: 1.3 K/uL (ref 0.7–4.0)
MCH: 28.2 pg (ref 26.0–34.0)
MCHC: 34.8 g/dL (ref 30.0–36.0)
MCV: 81 fL (ref 80.0–100.0)
Monocytes Absolute: 1.3 K/uL — ABNORMAL HIGH (ref 0.1–1.0)
Monocytes Relative: 5 %
Neutro Abs: 21.2 K/uL — ABNORMAL HIGH (ref 1.7–7.7)
Neutrophils Relative %: 89 %
Platelets: 496 K/uL — ABNORMAL HIGH (ref 150–400)
RBC: 5.64 MIL/uL (ref 4.22–5.81)
RDW: 13.3 % (ref 11.5–15.5)
WBC: 24 K/uL — ABNORMAL HIGH (ref 4.0–10.5)
nRBC: 0 % (ref 0.0–0.2)

## 2021-11-09 LAB — COMPREHENSIVE METABOLIC PANEL
ALT: 22 U/L (ref 0–44)
AST: 23 U/L (ref 15–41)
Albumin: 5.4 g/dL — ABNORMAL HIGH (ref 3.5–5.0)
Alkaline Phosphatase: 101 U/L (ref 38–126)
Anion gap: 23 — ABNORMAL HIGH (ref 5–15)
BUN: 21 mg/dL — ABNORMAL HIGH (ref 6–20)
CO2: 20 mmol/L — ABNORMAL LOW (ref 22–32)
Calcium: 10.7 mg/dL — ABNORMAL HIGH (ref 8.9–10.3)
Chloride: 102 mmol/L (ref 98–111)
Creatinine, Ser: 1.46 mg/dL — ABNORMAL HIGH (ref 0.61–1.24)
GFR, Estimated: >60 mL/min (ref >60)
Glucose, Bld: 140 mg/dL — ABNORMAL HIGH (ref 70–99)
Potassium: 3.4 mmol/L — ABNORMAL LOW (ref 3.5–5.1)
Sodium: 145 mmol/L (ref 135–145)
Total Bilirubin: 1.4 mg/dL — ABNORMAL HIGH (ref 0.3–1.2)
Total Protein: 9.6 g/dL — ABNORMAL HIGH (ref 6.5–8.1)

## 2021-11-09 LAB — CK: Total CK: 80 U/L (ref 49–397)

## 2021-11-09 LAB — RAPID URINE DRUG SCREEN, HOSP PERFORMED
Amphetamines: NOT DETECTED
Barbiturates: NOT DETECTED
Benzodiazepines: NOT DETECTED
Cocaine: POSITIVE — AB
Opiates: NOT DETECTED
Tetrahydrocannabinol: POSITIVE — AB

## 2021-11-09 LAB — RESP PANEL BY RT-PCR (FLU A&B, COVID) ARPGX2
Influenza A by PCR: NEGATIVE
Influenza B by PCR: NEGATIVE
SARS Coronavirus 2 by RT PCR: NEGATIVE

## 2021-11-09 LAB — MAGNESIUM: Magnesium: 2.3 mg/dL (ref 1.7–2.4)

## 2021-11-09 MED ORDER — HYDROXYZINE HCL 10 MG PO TABS: 5.0000 mg | ORAL_TABLET | Freq: Three times a day (TID) | ORAL | Status: DC | PRN

## 2021-11-09 MED ORDER — ONDANSETRON 4 MG PO TBDP
4.0000 mg | ORAL_TABLET | Freq: Once | ORAL | Status: AC
  Administered 2021-11-09: 4 mg via ORAL
  Filled 2021-11-09: qty 1

## 2021-11-09 MED ORDER — ACETAMINOPHEN 325 MG PO TABS
650.0000 mg | ORAL_TABLET | Freq: Once | ORAL | Status: AC
  Administered 2021-11-09: 650 mg via ORAL
  Filled 2021-11-09: qty 2

## 2021-11-09 MED ORDER — SODIUM CHLORIDE 0.9 % IV SOLN: INTRAVENOUS | Status: DC

## 2021-11-09 MED ORDER — PANTOPRAZOLE SODIUM 40 MG IV SOLR
40.0000 mg | Freq: Once | INTRAVENOUS | Status: AC
  Administered 2021-11-09: 40 mg via INTRAVENOUS
  Filled 2021-11-09: qty 10

## 2021-11-09 MED ORDER — STERILE WATER FOR INJECTION IJ SOLN
INTRAMUSCULAR | Status: AC
  Filled 2021-11-09: qty 10

## 2021-11-09 MED ORDER — HYDROXYZINE HCL 25 MG PO TABS
12.5000 mg | ORAL_TABLET | Freq: Three times a day (TID) | ORAL | Status: DC | PRN
  Administered 2021-11-09: 12.5 mg via ORAL
  Filled 2021-11-09 (×2): qty 1

## 2021-11-09 MED ORDER — PANTOPRAZOLE SODIUM 40 MG PO TBEC
40.0000 mg | DELAYED_RELEASE_TABLET | Freq: Two times a day (BID) | ORAL | Status: DC
  Administered 2021-11-09 – 2021-11-12 (×8): 40 mg via ORAL
  Filled 2021-11-09 (×8): qty 1

## 2021-11-09 MED ORDER — METOCLOPRAMIDE HCL 5 MG/ML IJ SOLN
10.0000 mg | Freq: Once | INTRAMUSCULAR | Status: AC
  Administered 2021-11-09: 10 mg via INTRAVENOUS
  Filled 2021-11-09: qty 2

## 2021-11-09 MED ORDER — METOPROLOL TARTRATE 50 MG PO TABS
50.0000 mg | ORAL_TABLET | Freq: Two times a day (BID) | ORAL | Status: DC
  Administered 2021-11-09 – 2021-11-12 (×7): 50 mg via ORAL
  Filled 2021-11-09 (×8): qty 1

## 2021-11-09 MED ORDER — ONDANSETRON HCL 4 MG/2ML IJ SOLN
4.0000 mg | Freq: Once | INTRAMUSCULAR | Status: AC
  Administered 2021-11-09: 4 mg via INTRAMUSCULAR
  Filled 2021-11-09: qty 2

## 2021-11-09 MED ORDER — LORAZEPAM 1 MG PO TABS
1.0000 mg | ORAL_TABLET | Freq: Three times a day (TID) | ORAL | Status: DC | PRN
  Administered 2021-11-09 – 2021-11-10 (×2): 1 mg via ORAL
  Filled 2021-11-09 (×2): qty 1

## 2021-11-09 MED ORDER — PROCHLORPERAZINE EDISYLATE 10 MG/2ML IJ SOLN: 10.0000 mg | Freq: Four times a day (QID) | INTRAMUSCULAR | Status: DC | PRN

## 2021-11-09 MED ORDER — MENTHOL 3 MG MT LOZG
1.0000 | LOZENGE | OROMUCOSAL | Status: DC | PRN
  Administered 2021-11-09: 3 mg via ORAL
  Filled 2021-11-09: qty 9

## 2021-11-09 MED ORDER — POTASSIUM CHLORIDE CRYS ER 20 MEQ PO TBCR
40.0000 meq | EXTENDED_RELEASE_TABLET | Freq: Once | ORAL | Status: AC
  Administered 2021-11-09: 40 meq via ORAL
  Filled 2021-11-09: qty 2

## 2021-11-09 MED ORDER — ACETAMINOPHEN 650 MG RE SUPP: 650.0000 mg | Freq: Four times a day (QID) | RECTAL | Status: DC | PRN

## 2021-11-09 MED ORDER — OLANZAPINE 5 MG PO TABS
10.0000 mg | ORAL_TABLET | Freq: Every day | ORAL | Status: DC
  Administered 2021-11-09 – 2021-11-12 (×4): 10 mg via ORAL
  Filled 2021-11-09 (×4): qty 2

## 2021-11-09 MED ORDER — LORAZEPAM 2 MG/ML IJ SOLN: 1.0000 mg | Freq: Three times a day (TID) | INTRAMUSCULAR | Status: DC | PRN

## 2021-11-09 MED ORDER — ACETAMINOPHEN 325 MG PO TABS
650.0000 mg | ORAL_TABLET | Freq: Four times a day (QID) | ORAL | Status: DC | PRN
  Administered 2021-11-12 (×2): 650 mg via ORAL
  Filled 2021-11-09 (×2): qty 2

## 2021-11-09 MED ORDER — IOHEXOL 300 MG/ML  SOLN
100.0000 mL | Freq: Once | INTRAMUSCULAR | Status: AC | PRN
  Administered 2021-11-09: 100 mL via INTRAVENOUS

## 2021-11-09 MED ORDER — ONDANSETRON HCL 4 MG/2ML IJ SOLN
4.0000 mg | Freq: Once | INTRAMUSCULAR | Status: DC
  Filled 2021-11-09: qty 2

## 2021-11-09 MED ORDER — SODIUM CHLORIDE 0.9 % IV BOLUS
1000.0000 mL | Freq: Once | INTRAVENOUS | Status: AC
  Administered 2021-11-09: 1000 mL via INTRAVENOUS

## 2021-11-09 MED ORDER — TOPIRAMATE 25 MG PO TABS
50.0000 mg | ORAL_TABLET | Freq: Every day | ORAL | Status: DC
  Administered 2021-11-09 – 2021-11-12 (×4): 50 mg via ORAL
  Filled 2021-11-09 (×4): qty 2

## 2021-11-09 MED ORDER — GABAPENTIN 400 MG PO CAPS
400.0000 mg | ORAL_CAPSULE | Freq: Three times a day (TID) | ORAL | Status: DC
  Administered 2021-11-09 – 2021-11-12 (×12): 400 mg via ORAL
  Filled 2021-11-09 (×12): qty 1

## 2021-11-09 MED ORDER — ZIPRASIDONE MESYLATE 20 MG IM SOLR
10.0000 mg | Freq: Once | INTRAMUSCULAR | Status: AC
  Administered 2021-11-09: 10 mg via INTRAMUSCULAR
  Filled 2021-11-09: qty 20

## 2021-11-09 NOTE — ED Notes (Signed)
Pt vomiting.

## 2021-11-09 NOTE — Assessment & Plan Note (Signed)
-  Cessation counseling provided -Topamax and gabapentin recommended by psychiatry service -Given underlying uncontrolled psychosis disorder recommendation for inpatient therapy has been provided. -Accepted to psychiatry facility for further evaluation and management. -Patient is medically stable. -Stable hemoglobin level and negative COVID test appreciated at time of discharge.

## 2021-11-09 NOTE — Assessment & Plan Note (Signed)
-   Patient experiencing couple episodes of vomiting on 11/08/2021 with a subsequent coffee-ground emesis on the date of admission (10/30/2021). -Occult blood positive test on vomiting content. -Overall symptoms improved/resolved. -No further nausea, vomiting or hematochezia prior to discharge. -GI service consulted recommended to continue adequate hydration, supportive care and the use of PPI twice a day. -CBC has remained stable and hemoglobin 13.1 at time of discharge. -Most likely Mallory-Weiss. -Follow clinical response.

## 2021-11-09 NOTE — Assessment & Plan Note (Signed)
-  In the setting of prerenal azotemia -Renal function within normal limits at discharge -Continue to maintain adequate hydration -Repeat basic metabolic panel in 2 weeks to follow electrolytes and renal function and stability.

## 2021-11-09 NOTE — Assessment & Plan Note (Signed)
-  In the setting of GI losses -Repleted and discharge on daily supplementation. -Continue to maintain adequate hydration -Follow basic metabolic panel in 2 weeks to reassess stability and trend.

## 2021-11-09 NOTE — Assessment & Plan Note (Signed)
-  Without frank source of infection currently appreciated. -Most likely demargination -After fluid resuscitation patient's WBCs back to normal range; 8.5 at discharge. -Patient has remained afebrile and not demonstrating signs of acute infection.

## 2021-11-09 NOTE — Assessment & Plan Note (Signed)
-  Patient with underlying history of schizophrenia -Per records was noncompliant with his medications prior to admission while acutely using recreational drugs. -Follow psychiatry service for further recommendations -Continue Zyprexa, Topamax and gabapentin. -As needed Ativan has also been recommended for anxiety and insomnia.Marland Kitchen

## 2021-11-09 NOTE — ED Provider Notes (Signed)
4:37 AM Patient more agitated, yelling at times. Vomiting on the floor and also grabbing the sitter's beverage out of the trash and drinking it. Will give additional dose of Geodon. TTS has evaluated the patient and recommends In-patient psych admission.    Truddie Hidden, MD 11/09/21 [phone]

## 2021-11-09 NOTE — ED Notes (Signed)
IVC paperwork faxed to [facility] [phone] 

## 2021-11-09 NOTE — Assessment & Plan Note (Signed)
-  Patient with prior history of SVT -Vital signs are stable currently -Continue treatment with metoprolol -Maintain adequate hydration.

## 2021-11-09 NOTE — Assessment & Plan Note (Signed)
-  Cessation counseling provided. -will recommend to receive Outpatient resources to be provided at time of discharge.

## 2021-11-09 NOTE — Assessment & Plan Note (Signed)
-  In the setting of withdrawal symptoms from recent drug use; versus presence of hyperemesis from the use of marijuana. -Diet fully advanced and well-tolerated prior to discharge. -Continue the use of PPI twice a day. -Maintain adequate hydration.

## 2021-11-09 NOTE — ED Notes (Signed)
At approx 410 this pt walked out of his room grabbed the can the the sitter was drinking out of and drank her drink. The sitter then threw away her drink. The pt then took the can from the garbage can and took it to his room. At this pt this nurse has [facility] called to come sit.address the pt.

## 2021-11-09 NOTE — Progress Notes (Signed)
Per Hessie Diener Bobbitt,NP, patient meets criteria for inpatient treatment. There are no available beds at [facility] today. CSW faxed referrals to the following facilities for review:  [address] [place] [address] [phone] --  [place] [address] [place] [address] [phone] --  [facility] [address] [place] [address] [phone] --  [place]  Pending - Request Sent N/A [place], [place] [address] [phone] --  [place] [address] [place], [place] [address] [phone] --  [place] [place] [address] [phone] --  [facility]  Pending - Request Sent N/A [address], [place] [address] [place] [address], [place] [address] [phone] --  [facility]  Pending - Request Sent N/A [address] [place] [address] [phone] --  [facility]  Pending - Request Sent N/A [address] [place] [address] [phone] --  [facility]  Pending - Request Sent N/A [place], [place] [address] [phone] --  [facility]  Pending - Request Sent N/A [address], [place] [address] [phone] --  [facility]  Pending - Request Sent N/A [address], [place] [address] [phone] --  [facility]  Pending - Request Sent N/A [address]  [place] [address] [phone] --   TTS will continue to seek bed placement.  Glennie Isle, MSW, [place] Phone: [phone] Disposition/TOC

## 2021-11-09 NOTE — Consult Note (Signed)
Telepsych Consultation   Reason for Consult:  Psychiatric Reassessment for  Referring Physician:  Teressa Lower, MD Location of Patient:    [facility] Location of Provider: Other: virtual home office  Patient Identification: Ronald Hoffman MRN:  [id number] Principal Diagnosis: <principal problem not specified> Diagnosis:  Active Problems:   Delusional disorder ([place])   Acute psychosis ([place])   Drug abuse ([place])   Substance-induced psychotic disorder ([place])   Total Time spent with patient: 20 minutes  Subjective:   Ronald Hoffman is a 41 y.o. male patient admitted with bia.  HPI:   Patient seen via telepsych by this provider; chart reviewed and consulted with Dr. Dwyane Dee on 11/09/21.  On evaluation Ronald Hoffman is seen laying in bed, dressed in hospital scrubs. He is alert and oriented x4.  States he's here because he stopped taking his medication for one week, ran out and did not get refills.  Reports he's seen at [facility] for outpatient mental health care but not sure why he didn't get refills.  Pt appears uncomfortable, has hand tremors and reports generalized body aches.  He denies suicidal or homicidal ideations; denies AVH.  Reports appetite and sleep as fair in lieu of nausea/vomiting.    Per nursing notes, he was agitated on arrival and required IM geodon; at present, pt is cooperative and answers all my questions.  Since admissions, he was restarted on home medications olanzapine 10mg  po qhs; gabapentin 400mg  po TID; He has had intermittent vomiting that has been treated with ondansetron prn.    Per ED Provider Admission Note 11/09/2021:  Chief Complaint(s) V70.1   HPI Ronald Hoffman is a 41 y.o. male with PMH anxiety, polysubstance abuse, psychosis, schizophrenia who presents emergency department for evaluation of Abnormal aggressive behavior.  Patient arrives via [facility] after family called out due to aggressive abnormal behavior.  Family told deputies that patient has been refusing  to take any of his psychiatric medications or return to [facility].  On arrival, patient screaming and making threatening statements to deputies as well as voicing active delusions.  Reacting to internal stimuli.  Denies SI or HI.    Past Psychiatric History: Schizophrenia, polysubstance usage.   Risk to Self:  yes Risk to Others:  potential Prior Inpatient Therapy:  yes Prior Outpatient Therapy:  yes- at [facility]  Past Medical History:  Past Medical History:  Diagnosis Date   Acute psychosis ([place]) 10/26/2019   Anxiety    Delusional disorder ([place]) 10/25/2019   Drug abuse ([place]) 03/19/2020   Hyperthyroidism    Lactic acidosis 02/21/2021   Seizures ([place])    stress related; no more seizures since first one 10 years ago; on no meds.   Sepsis ([place])    SIRS (systemic inflammatory response syndrome) ([place]) 02/20/2021   Substance-induced psychotic disorder ([place]) 03/19/2020   Tachycardia     Past Surgical History:  Procedure Laterality Date   HEMORRHOID SURGERY N/A 11/12/2015   Procedure: EXTENSIVE HEMORRHOIDECTOMY;  Surgeon: Aviva Signs, MD;  Location: [facility];  Service: General;  Laterality: N/A;   Family History: History reviewed. No pertinent family history. Family Psychiatric  History: denies Social History:  Social History   Substance and Sexual Activity  Alcohol Use Yes   Comment: last drink 10/25/2019, pt said he had 3 beers     Social History   Substance and Sexual Activity  Drug Use Yes   Types: Heroin, Methamphetamines, Other-see comments   Comment: was using synthetic heroin daily for 2 years  but stopped 2 months ago, methamphetamine daily for 3 months until June (last use 10/25/2019- "just a pinch"), "mushrooms yesterday," and "ketamine today"    Social History   Socioeconomic History   Marital status: Single    Spouse name: Not on file   Number of children: Not on file   Years of education: Not on file   Highest education level: Not on file  Occupational History   Not  on file  Tobacco Use   Smoking status: Former    Packs/day: 0.50    Years: 10.00    Total pack years: 5.00    Types: Cigarettes   Smokeless tobacco: Never  Vaping Use   Vaping Use: Every day  Substance and Sexual Activity   Alcohol use: Yes    Comment: last drink 10/25/2019, pt said he had 3 beers   Drug use: Yes    Types: Heroin, Methamphetamines, Other-see comments    Comment: was using synthetic heroin daily for 2 years but stopped 2 months ago, methamphetamine daily for 3 months until June (last use 10/25/2019- "just a pinch"), "mushrooms yesterday," and "ketamine today"   Sexual activity: Never    Birth control/protection: None  Other Topics Concern   Not on file  Social History Narrative   Not on file   Social Determinants of Health   Financial Resource Strain: Not on file  Food Insecurity: Not on file  Transportation Needs: Not on file  Physical Activity: Not on file  Stress: Not on file  Social Connections: Not on file   Additional Social History:    Allergies:   Allergies  Allergen Reactions   Hydrocodone Itching   Penicillins Other (See Comments)    Unknown childhood allergy Has patient had a PCN reaction causing immediate rash, facial/tongue/throat swelling, SOB or lightheadedness with hypotension: unknown Has patient had a PCN reaction causing severe rash involving mucus membranes or skin necrosis: unknown Has patient had a PCN reaction that required hospitalization: unknown Has patient had a PCN reaction occurring within the last 10 years:no If all of the above answers are "NO", then may proceed with Cephalosporin use.     Labs:  Results for orders placed or performed during the hospital encounter of 11/08/21 (from the past 48 hour(s))  Urinalysis, Routine w reflex microscopic Urine, Clean Catch     Status: Abnormal   Collection Time: 11/08/21  1:31 PM  Result Value Ref Range   Color, Urine AMBER (A) YELLOW    Comment: BIOCHEMICALS MAY BE AFFECTED  BY COLOR   APPearance HAZY (A) CLEAR   Specific Gravity, Urine 1.033 (H) 1.005 - 1.030   pH 5.0 5.0 - 8.0   Glucose, UA NEGATIVE NEGATIVE mg/dL   Hgb urine dipstick NEGATIVE NEGATIVE   Bilirubin Urine MODERATE (A) NEGATIVE   Ketones, ur 80 (A) NEGATIVE mg/dL   Protein, ur >=300 (A) NEGATIVE mg/dL   Nitrite NEGATIVE NEGATIVE   Leukocytes,Ua NEGATIVE NEGATIVE   Bacteria, UA NONE SEEN NONE SEEN   Mucus PRESENT    Hyaline Casts, UA PRESENT     Comment: Performed at [facility], [address] [place], [place] [address]  Rapid urine drug screen (hospital performed)     Status: Abnormal   Collection Time: 11/08/21  1:31 PM  Result Value Ref Range   Opiates NONE DETECTED NONE DETECTED   Cocaine POSITIVE (A) NONE DETECTED   Benzodiazepines NONE DETECTED NONE DETECTED   Amphetamines NONE DETECTED NONE DETECTED   Tetrahydrocannabinol POSITIVE (A) NONE DETECTED  Barbiturates NONE DETECTED NONE DETECTED    Comment: (NOTE) DRUG SCREEN FOR MEDICAL PURPOSES ONLY.  IF CONFIRMATION IS NEEDED FOR ANY PURPOSE, NOTIFY LAB WITHIN 5 DAYS.  LOWEST DETECTABLE LIMITS FOR URINE DRUG SCREEN Drug Class                     Cutoff (ng/mL) Amphetamine and metabolites    1000 Barbiturate and metabolites    200 Benzodiazepine                 [id number] Tricyclics and metabolites     300 Opiates and metabolites        300 Cocaine and metabolites        300 THC                            50 Performed at [place] [place], [place] [address]   Comprehensive metabolic panel     Status: Abnormal   Collection Time: 11/08/21  1:56 PM  Result Value Ref Range   Sodium 140 135 - 145 mmol/L   Potassium 3.3 (L) 3.5 - 5.1 mmol/L   Chloride 106 98 - 111 mmol/L   CO2 25 22 - 32 mmol/L   Glucose, Bld 102 (H) 70 - 99 mg/dL    Comment: Glucose reference range applies only to samples taken after fasting for at least 8 hours.   BUN 14 6 - 20 mg/dL   Creatinine, Ser 0.93 0.61 - 1.24 mg/dL   Calcium 9.1  8.9 - 10.3 mg/dL   Total Protein 7.4 6.5 - 8.1 g/dL   Albumin 4.3 3.5 - 5.0 g/dL   AST 15 15 - 41 U/L   ALT 17 0 - 44 U/L   Alkaline Phosphatase 82 38 - 126 U/L   Total Bilirubin 0.9 0.3 - 1.2 mg/dL   GFR, Estimated >60 >60 mL/min    Comment: (NOTE) Calculated using the CKD-EPI Creatinine Equation (2021)    Anion gap 9 5 - 15    Comment: Performed at [facility], [address], [place], [place] [address]  CBC with Differential     Status: None   Collection Time: 11/08/21  1:56 PM  Result Value Ref Range   WBC 9.3 4.0 - 10.5 K/uL   RBC 5.05 4.22 - 5.81 MIL/uL   Hemoglobin 14.1 13.0 - 17.0 g/dL   HCT 40.7 39.0 - 52.0 %   MCV 80.6 80.0 - 100.0 fL   MCH 27.9 26.0 - 34.0 pg   MCHC 34.6 30.0 - 36.0 g/dL   RDW 12.9 11.5 - 15.5 %   Platelets 300 150 - 400 K/uL   nRBC 0.0 0.0 - 0.2 %   Neutrophils Relative % 80 %   Neutro Abs 7.4 1.7 - 7.7 K/uL   Lymphocytes Relative 13 %   Lymphs Abs 1.2 0.7 - 4.0 K/uL   Monocytes Relative 6 %   Monocytes Absolute 0.5 0.1 - 1.0 K/uL   Eosinophils Relative 1 %   Eosinophils Absolute 0.1 0.0 - 0.5 K/uL   Basophils Relative 0 %   Basophils Absolute 0.0 0.0 - 0.1 K/uL   Immature Granulocytes 0 %   Abs Immature Granulocytes 0.02 0.00 - 0.07 K/uL    Comment: Performed at [facility], [address] [place], [place] [address]  Resp Panel by RT-PCR (Flu A&B, Covid) Anterior Nasal Swab     Status: None   Collection Time:  11/09/21 10:31 AM   Specimen: Anterior Nasal Swab  Result Value Ref Range   SARS Coronavirus 2 by RT PCR NEGATIVE NEGATIVE    Comment: (NOTE) SARS-CoV-2 target nucleic acids are NOT DETECTED.  The SARS-CoV-2 RNA is generally detectable in upper respiratory specimens during the acute phase of infection. The lowest concentration of SARS-CoV-2 viral copies this assay can detect is 138 copies/mL. A negative result does not preclude SARS-Cov-2 infection and should not be used as the sole basis for treatment or other patient management  decisions. A negative result may occur with  improper specimen collection/handling, submission of specimen other than nasopharyngeal swab, presence of viral mutation(s) within the areas targeted by this assay, and inadequate number of viral copies(<138 copies/mL). A negative result must be combined with clinical observations, patient history, and epidemiological information. The expected result is Negative.  Fact Sheet for Patients:  EntrepreneurPulse.com.au  Fact Sheet for Healthcare Providers:  IncredibleEmployment.be  This test is no t yet approved or cleared by the [place] FDA and  has been authorized for detection and/or diagnosis of SARS-CoV-2 by FDA under an Emergency Use Authorization (EUA). This EUA will remain  in effect (meaning this test can be used) for the duration of the COVID-19 declaration under Section 564(b)(1) of the Act, 21 U.S.C.section 360bbb-3(b)(1), unless the authorization is terminated  or revoked sooner.       Influenza A by PCR NEGATIVE NEGATIVE   Influenza B by PCR NEGATIVE NEGATIVE    Comment: (NOTE) The Xpert Xpress SARS-CoV-2/FLU/RSV plus assay is intended as an aid in the diagnosis of influenza from Nasopharyngeal swab specimens and should not be used as a sole basis for treatment. Nasal washings and aspirates are unacceptable for Xpert Xpress SARS-CoV-2/FLU/RSV testing.  Fact Sheet for Patients: EntrepreneurPulse.com.au  Fact Sheet for Healthcare Providers: IncredibleEmployment.be  This test is not yet approved or cleared by the [place] FDA and has been authorized for detection and/or diagnosis of SARS-CoV-2 by FDA under an Emergency Use Authorization (EUA). This EUA will remain in effect (meaning this test can be used) for the duration of the COVID-19 declaration under Section 564(b)(1) of the Act, 21 U.S.C. section 360bbb-3(b)(1), unless the authorization  is terminated or revoked.  Performed at [facility], [address], [place], [place] [address]   CBC with Differential     Status: Abnormal   Collection Time: 11/09/21 10:47 AM  Result Value Ref Range   WBC 24.0 (H) 4.0 - 10.5 K/uL   RBC 5.64 4.22 - 5.81 MIL/uL   Hemoglobin 15.9 13.0 - 17.0 g/dL   HCT 45.7 39.0 - 52.0 %   MCV 81.0 80.0 - 100.0 fL   MCH 28.2 26.0 - 34.0 pg   MCHC 34.8 30.0 - 36.0 g/dL   RDW 13.3 11.5 - 15.5 %   Platelets 496 (H) 150 - 400 K/uL   nRBC 0.0 0.0 - 0.2 %   Neutrophils Relative % 89 %   Neutro Abs 21.2 (H) 1.7 - 7.7 K/uL   Lymphocytes Relative 5 %   Lymphs Abs 1.3 0.7 - 4.0 K/uL   Monocytes Relative 5 %   Monocytes Absolute 1.3 (H) 0.1 - 1.0 K/uL   Eosinophils Relative 0 %   Eosinophils Absolute 0.0 0.0 - 0.5 K/uL   Basophils Relative 0 %   Basophils Absolute 0.1 0.0 - 0.1 K/uL   Immature Granulocytes 1 %   Abs Immature Granulocytes 0.15 (H) 0.00 - 0.07 K/uL    Comment: Performed at  [place] [place], [place] [address]  Comprehensive metabolic panel     Status: Abnormal   Collection Time: 11/09/21 10:47 AM  Result Value Ref Range   Sodium 145 135 - 145 mmol/L   Potassium 3.4 (L) 3.5 - 5.1 mmol/L   Chloride 102 98 - 111 mmol/L   CO2 20 (L) 22 - 32 mmol/L   Glucose, Bld 140 (H) 70 - 99 mg/dL    Comment: Glucose reference range applies only to samples taken after fasting for at least 8 hours.   BUN 21 (H) 6 - 20 mg/dL   Creatinine, Ser 1.46 (H) 0.61 - 1.24 mg/dL   Calcium 10.7 (H) 8.9 - 10.3 mg/dL   Total Protein 9.6 (H) 6.5 - 8.1 g/dL   Albumin 5.4 (H) 3.5 - 5.0 g/dL   AST 23 15 - 41 U/L   ALT 22 0 - 44 U/L   Alkaline Phosphatase 101 38 - 126 U/L   Total Bilirubin 1.4 (H) 0.3 - 1.2 mg/dL   GFR, Estimated >60 >60 mL/min    Comment: (NOTE) Calculated using the CKD-EPI Creatinine Equation (2021)    Anion gap 23 (H) 5 - 15    Comment: Electrolytes repeated to confirm. Performed at [facility], [address]  [place], [place] [address]   Lipase, blood     Status: None   Collection Time: 11/09/21 10:47 AM  Result Value Ref Range   Lipase 29 11 - 51 U/L    Comment: Performed at [facility], [address] [place], [place] [address]  Urinalysis, Routine w reflex microscopic Urine, Clean Catch     Status: Abnormal   Collection Time: 11/09/21 11:33 AM  Result Value Ref Range   Color, Urine AMBER (A) YELLOW    Comment: BIOCHEMICALS MAY BE AFFECTED BY COLOR   APPearance HAZY (A) CLEAR   Specific Gravity, Urine 1.029 1.005 - 1.030   pH 5.0 5.0 - 8.0   Glucose, UA NEGATIVE NEGATIVE mg/dL   Hgb urine dipstick NEGATIVE NEGATIVE   Bilirubin Urine SMALL (A) NEGATIVE   Ketones, ur 80 (A) NEGATIVE mg/dL   Protein, ur 100 (A) NEGATIVE mg/dL   Nitrite NEGATIVE NEGATIVE   Leukocytes,Ua NEGATIVE NEGATIVE   RBC / HPF 0-5 0 - 5 RBC/hpf   WBC, UA 0-5 0 - 5 WBC/hpf   Bacteria, UA NONE SEEN NONE SEEN   Squamous Epithelial / LPF 0-5 0 - 5   Mucus PRESENT    Hyaline Casts, UA PRESENT     Comment: Performed at [facility], [address] [place], [place] [address]    Medications:  Current Facility-Administered Medications  Medication Dose Route Frequency Provider Last Rate Last Admin   gabapentin (NEURONTIN) capsule 400 mg  400 mg Oral TID Milton Ferguson, MD   400 mg at 11/09/21 1021   metoprolol tartrate (LOPRESSOR) tablet 50 mg  50 mg Oral BID Milton Ferguson, MD       OLANZapine (ZYPREXA) tablet 10 mg  10 mg Oral QHS Milton Ferguson, MD       sterile water (preservative free) injection            topiramate (TOPAMAX) tablet 50 mg  50 mg Oral Daily Merlyn Lot E, NP   50 mg at 11/09/21 1022   Current Outpatient Medications  Medication Sig Dispense Refill   gabapentin (NEURONTIN) 400 MG capsule Take 1 capsule (400 mg total) by mouth 3 (three) times daily. (Patient not taking: Reported on 11/08/2021)  90 capsule 1   metoprolol tartrate (LOPRESSOR) 50 MG tablet Take 1 tablet (50 mg total) by mouth 2 (two) times  daily. (Patient not taking: Reported on 11/08/2021) 60 tablet 1   OLANZapine (ZYPREXA) 10 MG tablet Take 1 tablet (10 mg total) by mouth at bedtime. (Patient not taking: Reported on 11/08/2021) 30 tablet 1    Musculoskeletal:pt moves all extremities and ambulates independently Strength & Muscle Tone: within normal limits Gait & Station: normal Patient leans: N/A   Psychiatric Specialty Exam:  Presentation  General Appearance: Fairly Groomed  Eye Contact:Fair  Speech:Clear and Coherent  Speech Volume:Normal  Handedness:Right   Mood and Affect  Mood:Anxious; Dysphoric  Affect:Congruent; Constricted   Thought Process  Thought Processes:Coherent; Goal Directed  Descriptions of Associations:Intact  Orientation:Full (Time, Place and Person)  Thought Content:Logical  History of Schizophrenia/Schizoaffective disorder:Yes  Duration of Psychotic Symptoms:Greater than six months  Hallucinations:Hallucinations: None  Ideas of Reference:None  Suicidal Thoughts:Suicidal Thoughts: No  Homicidal Thoughts:Homicidal Thoughts: No   Sensorium  Memory:No data recorded Judgment:-- (impulsive- AEB polysubstance abuse)  Insight:Lacking   Executive Functions  Concentration:Fair  Attention Span:Fair  [place]  Language:Good   Psychomotor Activity  Psychomotor Activity:Psychomotor Activity: Increased   Assets  Assets:Communication Skills; Desire for Improvement   Sleep  Sleep:Sleep: Fair Number of Hours of Sleep: 6    Physical Exam: Physical Exam Cardiovascular:     Rate and Rhythm: Normal rate.  Pulmonary:     Effort: Pulmonary effort is normal.  Musculoskeletal:        General: Normal range of motion.     Cervical back: Normal range of motion.  Neurological:     Mental Status: He is alert and oriented to person, place, and time.  Psychiatric:        Attention and Perception: Attention and perception normal.        Mood  and Affect: Mood is anxious and depressed.        Speech: Speech normal.        Behavior: Behavior is cooperative.        Cognition and Memory: Cognition and memory normal.        Judgment: Judgment is impulsive.    Review of Systems  Constitutional: Negative.   HENT: Negative.    Eyes: Negative.   Respiratory: Negative.    Cardiovascular: Negative.   Gastrointestinal: Negative.   Genitourinary: Negative.   Musculoskeletal:  Positive for myalgias (generalized).  Skin: Negative.   Neurological:  Positive for tremors. Negative for seizures, loss of consciousness and weakness.  Psychiatric/Behavioral:  Positive for substance abuse. The patient is nervous/anxious.    Blood pressure 106/70, pulse 91, temperature 98 F (36.7 C), temperature source Oral, resp. rate 16, SpO2 97 %. There is no height or weight on file to calculate BMI.  Treatment Plan Summary: Patient is mentally decompensated after being off psychotropic medications for one week; he's been self medicating with cocaine.  Admission UDS is positive for cocaine and THC.  Pt is alert and oriented but not at baseline. He has already been medically cleared although his WBCs are elevated he is negative for covid, and influenza.   Daily contact with patient to assess and evaluate symptoms and progress in treatment and Medication management. Pt need updated EKG to rule out prolonged QT intervals.   Medications:  Home medications have already been restarted.  Continue:  Olanzapine 10mg  po qhs for schizophrenia Gabapentin 400mg  po TID   Start: Topamax 50mg  po  daily for withdrawal symptoms  Disposition: Recommend psychiatric Inpatient admission when medically cleared. Per [facility], there are no appropriate beds at [facility], thus SW has already faxed pt out.   This service was provided via telemedicine using a 2-way, interactive audio and video technology.  Names of all persons participating in this telemedicine service and their role in this  encounter. Name: Teodoro Kil Role: Patient  Name: Merlyn Lot Role: PMHNP    Mallie Darting, NP 11/09/2021 12:27 PM

## 2021-11-09 NOTE — H&P (Signed)
History and Physical    Patient: Ronald Hoffman KZL:[id number] DOB: July 23, 1980 DOA: 11/08/2021 DOS: the patient was seen and examined on 11/09/2021 PCP: Pcp, No  Patient coming from: Home  Chief Complaint:  Chief Complaint  Patient presents with   V70.1   HPI: Ronald Hoffman is a 41 y.o. male with medical history significant of history of SVT, polysubstance abuse, schizophrenia, anxiety and delusional disorder.  Patient presented to the ED on 11/08/2021 after missing multiple days of his medications and using recreational drugs that had caused him to experience substance-induced psychotic disorder.  He was seen by TTS with recommendations for inpatient psych treatment.  While waiting for available facility he has experienced multiple vomiting events without episodes of coffee-ground emesis and positive occult blood test on vomiting content.   Case was discussed with gastroenterology service who recommended admission for PPI, fluid resuscitation, antiemetics and clear liquid diet with intention to slowly advance as tolerated.  If require endoscopy evaluation will be provided. [facility] has been contacted to place patient in the hospital for further evaluation and management.  Work-up demonstrating a stable hemoglobin level; with some signs of dehydration and AKI.  Review of Systems: Unable to review all systems due to lack of cooperation from patient. Past Medical History:  Diagnosis Date   Acute psychosis ([place]) 10/26/2019   Anxiety    Delusional disorder ([place]) 10/25/2019   Drug abuse ([place]) 03/19/2020   Hyperthyroidism    Lactic acidosis 02/21/2021   Seizures ([place])    stress related; no more seizures since first one 10 years ago; on no meds.   Sepsis ([place])    SIRS (systemic inflammatory response syndrome) ([place]) 02/20/2021   Substance-induced psychotic disorder ([place]) 03/19/2020   Tachycardia    Past Surgical History:  Procedure Laterality Date   HEMORRHOID SURGERY N/A 11/12/2015   Procedure:  EXTENSIVE HEMORRHOIDECTOMY;  Surgeon: Aviva Signs, MD;  Location: [facility];  Service: General;  Laterality: N/A;   Social History:  reports that he has quit smoking. His smoking use included cigarettes. He has a 5.00 pack-year smoking history. He has never used smokeless tobacco. He reports current alcohol use. He reports current drug use. Drugs: Heroin, Methamphetamines, and Other-see comments.  Allergies  Allergen Reactions   Hydrocodone Itching   Penicillins Other (See Comments)    Unknown childhood allergy Has patient had a PCN reaction causing immediate rash, facial/tongue/throat swelling, SOB or lightheadedness with hypotension: unknown Has patient had a PCN reaction causing severe rash involving mucus membranes or skin necrosis: unknown Has patient had a PCN reaction that required hospitalization: unknown Has patient had a PCN reaction occurring within the last 10 years:no If all of the above answers are "NO", then may proceed with Cephalosporin use.     History reviewed. No pertinent family history.  Prior to Admission medications   Medication Sig Start Date End Date Taking? Authorizing Provider  gabapentin (NEURONTIN) 400 MG capsule Take 1 capsule (400 mg total) by mouth 3 (three) times daily. Patient not taking: Reported on 11/08/2021 02/25/21   Orson Eva, MD  metoprolol tartrate (LOPRESSOR) 50 MG tablet Take 1 tablet (50 mg total) by mouth 2 (two) times daily. Patient not taking: Reported on 11/08/2021 02/25/21   Orson Eva, MD  OLANZapine (ZYPREXA) 10 MG tablet Take 1 tablet (10 mg total) by mouth at bedtime. Patient not taking: Reported on 11/08/2021 02/25/21   Orson Eva, MD    Physical Exam: Vitals:   11/09/21 1530 11/09/21 1546 11/09/21 1631 11/09/21  1649  BP: 110/69 110/69 126/70   Pulse: 88 71 82   Resp: 15 12 14    Temp:  98.6 F (37 C) 98 F (36.7 C)   TempSrc:  Oral Oral   SpO2: 95% 96% 98%   Weight:    62.4 kg   General exam: Alert, awake and following  commands; currently less agitated in comparison to report from previous EDP assessment.  Patient appears to be oriented to person and place. Respiratory system: Clear to auscultation. Respiratory effort normal.  Good saturation on room air. Cardiovascular system:RRR. No murmurs, rubs, gallops.  No JVD. Gastrointestinal system: Abdomen is nondistended, soft and nontender. No organomegaly or masses felt. Normal bowel sounds heard. Central nervous system: No focal neurological deficits. Extremities: No cyanosis or clubbing. Skin: No petechiae. Psychiatry: Judgement and insight appear impaired currently in the setting of psychosis.  Data Reviewed: Respiratory panel negative for COVID and influenza -CBC: WBCs 24,000, hemoglobin 15.9, platelet count 496 K Lipase: 29 Comprehensive metabolic panel: Sodium [id number], potassium 3.4, chloride 102, bicarb 20, glucose 140, BUN 21, creatinine 1.46 overall normal LFTs.   Assessment and Plan: * Nausea & vomiting - In the setting of withdrawal symptoms from recent drug use; versus presence of hyperemesis from the use of marijuana. -Clear liquid diet with intention to slowly advance as tolerated when stable. -Continue the use of as needed antiemetics and PPI -Provide fluid resuscitation and follow clinical response.  Substance-induced psychotic disorder ([place]) - Cessation counseling provided -Topamax and gabapentin recommended by psychiatry service -Given underlying uncontrolled psychosis disorder recommendation for inpatient therapy has been provided. -Currently no beds available -Will continue providing supportive care and keeping patient medically stable.  Drug abuse ([place]) - Cessation counseling provided.  Hypokalemia - In the setting of GI losses -At time of admission 3.3; currently 3.4 -Will check magnesium level and further replete potassium.  Leukocytosis - Without frank source of infection currently appreciated. -Most likely  demargination -Continue fluid resuscitation and follow WBCs trend.  AKI (acute kidney injury) ([place]) - In the setting of prerenal azotemia and concern for nontraumatic rhabdomyolysis in the setting of ongoing agitation and recent polysubstance abuse. -Will provide fluid resuscitation -Checking urinalysis and CK level -Avoid/minimize nephrotoxic agents -Follow renal function trend.  SVT (supraventricular tachycardia) - Patient with prior history of SVT -Vital signs are stable currently -Continue treatment with metoprolol -Maintain adequate hydration.  Coffee ground emesis - Patient experiencing couple episodes of vomiting on 11/08/2021 with a subsequent coffee-ground emesis on the date of admission (10/30/2021). -Occult blood positive test on vomiting content. -Overall symptoms improved with the use of antiemetics medication (Compazine and Reglan). -GI service has been consulted; recommending clear liquid diet, PPI, IV fluids and supportive care. -CBC not demonstrating life-threatening bleeding with a hemoglobin of 15.9 currently. -Patient hemodynamically stable.  -Most likely Mallory-Weiss. -Follow clinical response.  Acute psychosis ([place]) - Patient with underlying history of schizophrenia -Per records was noncompliant with his medications prior to admission while acutely using recreational drugs. -Follow psychiatry service for further recommendations -Continue Zyprexa, Topamax and gabapentin. -As needed Atarax and Ativan has also been recommended.  Delusional disorder ([place]) - Most likely in the use by the use of recreational drugs (marijuana and cocaine) -Cessation counseling provided -Continue to follow recommendations by psychiatry service.    Advance Care Planning:   Code Status: Full Code   Consults: Psychiatry service and GI.  Family Communication: No family at bedside.  Severity of Illness: The appropriate patient status for this patient is  OBSERVATION. Observation  status is judged to be reasonable and necessary in order to provide the required intensity of service to ensure the patient's safety. The patient's presenting symptoms, physical exam findings, and initial radiographic and laboratory data in the context of their medical condition is felt to place them at decreased risk for further clinical deterioration. Furthermore, it is anticipated that the patient will be medically stable for discharge from the hospital within 2 midnights of admission.   Author: Barton Dubois, MD 11/09/2021 5:46 PM  For on call review [email or web address].

## 2021-11-09 NOTE — ED Provider Notes (Signed)
Patient has a history of schizophrenia and presented yesterday with aggressive behavior.  He also started vomiting last night.  Patient has continued to vomit all day today and complains of epigastric pain.  He was given fluids Zofran and Protonix.  We repeated his labs and his white blood count has gone up from 9-24,000 and his creatinine has raised up to 1.46.  CT abdomen unremarkable.  I spoke with Dr. Abbey Chatters gastroenterology and they will see the patient tomorrow and consider further imaging if his liver enzymes continue to get worse.  Patient is admitted to the medicine service   Milton Ferguson, MD 11/14/21 1001

## 2021-11-09 NOTE — ED Notes (Signed)
Pt vomiting again, advised pt not to drink or eat, RN notified

## 2021-11-09 NOTE — ED Notes (Signed)
Patient noted to be vomiting behind stretcher on towels. Coffee grain emesis noted on towel. Dr Roderic Palau made aware-to room to assess patient. Last BM this morning per patient-no blood noted. Patient took oral medications prior to emesis noted-will continue to monitor. Patient given emesis bucket.

## 2021-11-09 NOTE — Assessment & Plan Note (Addendum)
-  Most likely in the use by the use of recreational drugs (marijuana and cocaine) -Cessation counseling provided -Continue to follow recommendations by psychiatry service.

## 2021-11-09 NOTE — BH Assessment (Addendum)
Comprehensive Clinical Assessment (CCA) Note  11/09/2021 Ronald Hoffman [id number] Disposition: Clinician discussed patient care with [name], NP.  She recommended inpatient care.  Clinician informed RN [name] of the disposition recommendation via secure messaging.  Patient has fair eye contact.  He talks loudly and at times shouts at someone not in the room.  He starts yelling at times.  Towards the end he calms down some.  He responds to internal stimuli and is rude to staff in the  ED.  Pt can answer questions but is distracted by what he is hearing internally.  Pt was at [facility] in 2021.  He is supposed to be going to [facility] in [place].  He says he is supposed to be seeing a psychiatrist with [name]" but is unclear if he is referring to [name].   Chief Complaint:  Chief Complaint  Patient presents with   V70.1   Visit Diagnosis: Delusional d/o    CCA Screening, Triage and Referral (STR)  Patient Reported Information How did you hear about [place]? Legal System  What Is the Reason for Your Visit/Call Today? Pt has reportedly not been on his medication.  Pt says he was brought to the hospital because he was screaming and crying at home.  He lives with his parents and his brother.  Pt says "I don't know" when about SI.  He says he sometimes feels HI.  Pt says that there is a "3-D tube put in my stomach that pushes up into my goddam head and they put it in here."  At one point patient Pt says he has been off his medication for a week.  He says it gave him "the shakes."  Pt has been using cocaine daily he says.  Patient UDS is not available at this time.  Patient family had called out [employer] because patient had been acting aggressively, threatening them and yelling.  When sherif deputies arrived at the home patient started threatening them and voicing delusions.  Patient was placed on IVC at the hospital.  Pt did yell at someone that was not in the room.  He is reacting to internal  stimuli.  How Long Has This Been Causing You Problems? > than 6 months  What Do You Feel Would Help You the Most Today? Treatment for Depression or other mood problem; Medication(s)   Have You Recently Had Any Thoughts About Hurting Yourself? No  Are You Planning to Commit Suicide/Harm Yourself At This time? No   Have you Recently Had Thoughts About [name]? Yes  Are You Planning to Harm Someone at This Time? No  Explanation: No data recorded  Have You Used Any Alcohol or Drugs in the Past 24 Hours? No  How Long Ago Did You Use Drugs or Alcohol? No data recorded What Did You Use and How Much? No data recorded  Do You Currently Have a Therapist/Psychiatrist? Yes  Name of Therapist/Psychiatrist: Had been goign to [facility] in [place].  Pt says he sees someone at [facility]"  He is unclear about whether he is referring to [name].   Have You Been Recently Discharged From Any Office Practice or Programs? No  Explanation of Discharge From Practice/Program: No data recorded    CCA Screening Triage Referral Assessment Type of Contact: Tele-Assessment  Telemedicine Service Delivery:   Is this Initial or Reassessment? Initial Assessment  Date Telepsych consult ordered in [facility]:  11/08/21  Time Telepsych consult ordered in [facility]:  1242  Location of Assessment:  [facility] ED  Provider Location: [facility] Assessment Services   Collateral Involvement: No data recorded  Does Patient Have a Court Appointed Legal Guardian? No  Legal Guardian Contact Information: No data recorded Copy of Legal Guardianship Form: No data recorded Legal Guardian Notified of Arrival: No data recorded Legal Guardian Notified of Pending Discharge: No data recorded If Minor and Not Living with Parent(s), Who has Custody? No data recorded Is CPS involved or ever been involved? No data recorded Is APS involved or ever been involved? No data recorded  Patient Determined To Be At Risk for Harm To  Self or Others Based on Review of Patient Reported Information or Presenting Complaint? No  Method: No data recorded Availability of Means: No data recorded Intent: No data recorded Notification Required: No data recorded Additional Information for Danger to Others Potential: No data recorded Additional Comments for Danger to Others Potential: No data recorded Are There Guns or Other Weapons in Your Home? No data recorded Types of Guns/Weapons: No data recorded Are These Weapons Safely Secured?                            No data recorded Who Could Verify You Are Able To Have These Secured: No data recorded Do You Have any Outstanding Charges, Pending Court Dates, Parole/Probation? No data recorded Contacted To Inform of Risk of Harm To Self or Others: No data recorded   Does Patient Present under Involuntary Commitment? Yes  IVC Papers Initial File Date: 11/08/21   [place] of Residence: [place]   Patient Currently Receiving the Following Services: Medication Management   Determination of Need: Urgent (48 hours)   Options For Referral: Inpatient Hospitalization     CCA Biopsychosocial Patient Reported Schizophrenia/Schizoaffective Diagnosis in Past: Yes   Strengths: Not assessed.   Mental Health Symptoms Depression:   Tearfulness; Sleep (too much or little); Difficulty Concentrating; Fatigue   Duration of Depressive symptoms:  Duration of Depressive Symptoms: Greater than two weeks   Mania:   Increased Energy; Irritability; Racing thoughts; Recklessness   Anxiety:    Restlessness; Sleep; Tension; Worrying   Psychosis:   Delusions   Duration of Psychotic symptoms:  Duration of Psychotic Symptoms: Less than six months   Trauma:   N/A   Obsessions:   N/A   Compulsions:   N/A   Inattention:   N/A   Hyperactivity/Impulsivity:   N/A   Oppositional/Defiant Behaviors:   None   Emotional Irregularity:   Potentially harmful impulsivity   Other  Mood/Personality Symptoms:  No data recorded   Mental Status Exam Appearance and self-care  Stature:   Average   Weight:   Average weight   Clothing:   Casual   Grooming:   Neglected   Cosmetic use:   None   Posture/gait:   Normal   Motor activity:   Not Remarkable   Sensorium  Attention:   Normal   Concentration:   Normal   Orientation:   Situation; Place; Person   Recall/memory:   Defective in Short-term   Affect and Mood  Affect:   Full Range; Inappropriate   Mood:   Irritable; Negative   Relating  Eye contact:   Fleeting   Facial expression:   Responsive   Attitude toward examiner:   Dramatic; Irritable   Thought and Language  Speech flow:  Profane; Flight of Ideas   Thought content:   Appropriate to Mood and Circumstances   Preoccupation:  None   Hallucinations:   Auditory; Visual   Organization:  No data recorded  [employer] of Knowledge:   Poor   Intelligence:   Average   Abstraction:   Popular   Judgement:   Poor   Reality Testing:   Distorted   Insight:   Poor   Decision Making:   Impulsive   Social Functioning  Social Maturity:   Irresponsible   Social Judgement:   Heedless   Stress  Stressors:   Family conflict   Coping Ability:   Deficient supports   Skill Deficits:  No data recorded  Supports:   Family     Religion: Religion/Spirituality Are You A Religious Person?: No  Leisure/Recreation: Leisure / Recreation Do You Have Hobbies?: Yes Leisure and Hobbies: Go carting.  Exercise/Diet: Exercise/Diet Do You Exercise?: Yes Have You Gained or Lost A Significant Amount of Weight in the Past Six Months?: No Do You Follow a Special Diet?: No Do You Have Any Trouble Sleeping?: Yes Explanation of Sleeping Difficulties: Pt reported, not getting much sleep.   CCA Employment/Education Employment/Work Situation: Employment / Work Situation Employment Situation:  Unemployed Patient's Job has Been Impacted by Current Illness: No Has Patient ever Been in Equities trader?: No  Education: Education Is Patient Currently Attending School?: No Last Grade Completed: 12 Did You Product manager?: No   CCA Family/Childhood History Family and Relationship History: Family history Marital status: Single Does patient have children?: No  Childhood History:  Childhood History By whom was/is the patient raised?: Both parents Did patient suffer any verbal/emotional/physical/sexual abuse as a child?: No Has patient ever been sexually abused/assaulted/raped as an adolescent or adult?: No Witnessed domestic violence?: No  Child/Adolescent Assessment:     CCA Substance Use Alcohol/Drug Use: Alcohol / Drug Use Prescriptions: Pt says he is supposed to be on haldol and cogentin.                         ASAM's:  Six Dimensions of Multidimensional Assessment  Dimension 1:  Acute Intoxication and/or Withdrawal Potential:      Dimension 2:  Biomedical Conditions and Complications:      Dimension 3:  Emotional, Behavioral, or Cognitive Conditions and Complications:     Dimension 4:  Readiness to Change:     Dimension 5:  Relapse, Continued use, or Continued Problem Potential:     Dimension 6:  Recovery/Living Environment:     ASAM Severity Score:    ASAM Recommended Level of Treatment:     Substance use Disorder (SUD)    Recommendations for Services/Supports/Treatments:    Discharge Disposition:    DSM5 Diagnoses: Patient Active Problem List   Diagnosis Date Noted   Hyperthyroidism    Lactic acidosis 02/21/2021   Sepsis (HCC)    Tachycardia    SIRS (systemic inflammatory response syndrome) (HCC) 02/20/2021   Drug abuse (HCC) 03/19/2020   Substance-induced psychotic disorder (HCC) 03/19/2020   Acute psychosis (HCC) 10/26/2019   Delusional disorder (HCC) 10/25/2019     Referrals to Alternative Service(s): Referred to Alternative  Service(s):   Place:   Date:   Time:    Referred to Alternative Service(s):   Place:   Date:   Time:    Referred to Alternative Service(s):   Place:   Date:   Time:    Referred to Alternative Service(s):   Place:   Date:   Time:     Wandra Mannan

## 2021-11-10 DIAGNOSIS — Z791 Long term (current) use of non-steroidal anti-inflammatories (NSAID): Secondary | ICD-10-CM

## 2021-11-10 DIAGNOSIS — F19159 Other psychoactive substance abuse with psychoactive substance-induced psychotic disorder, unspecified: Secondary | ICD-10-CM

## 2021-11-10 DIAGNOSIS — R112 Nausea with vomiting, unspecified: Secondary | ICD-10-CM | POA: Diagnosis not present

## 2021-11-10 MED ORDER — FOLIC ACID 1 MG PO TABS
1.0000 mg | ORAL_TABLET | Freq: Every day | ORAL | Status: DC
  Administered 2021-11-10 – 2021-11-12 (×3): 1 mg via ORAL
  Filled 2021-11-10 (×3): qty 1

## 2021-11-10 MED ORDER — LORAZEPAM 2 MG/ML IJ SOLN
0.0000 mg | Freq: Four times a day (QID) | INTRAMUSCULAR | Status: AC
  Administered 2021-11-10 (×3): 2 mg via INTRAVENOUS
  Administered 2021-11-11: 3 mg via INTRAVENOUS
  Administered 2021-11-11 (×2): 2 mg via INTRAVENOUS
  Filled 2021-11-10: qty 2
  Filled 2021-11-10 (×5): qty 1

## 2021-11-10 MED ORDER — LORAZEPAM 2 MG/ML IJ SOLN
0.0000 mg | Freq: Two times a day (BID) | INTRAMUSCULAR | Status: DC
  Administered 2021-11-12: 3 mg via INTRAVENOUS
  Filled 2021-11-10: qty 2

## 2021-11-10 MED ORDER — ADULT MULTIVITAMIN W/MINERALS CH
1.0000 | ORAL_TABLET | Freq: Every day | ORAL | Status: DC
  Administered 2021-11-10 – 2021-11-12 (×3): 1 via ORAL
  Filled 2021-11-10 (×3): qty 1

## 2021-11-10 MED ORDER — THIAMINE HCL 100 MG/ML IJ SOLN: 100.0000 mg | Freq: Every day | INTRAMUSCULAR | Status: DC

## 2021-11-10 MED ORDER — LORAZEPAM 1 MG PO TABS
1.0000 mg | ORAL_TABLET | ORAL | Status: DC | PRN
  Administered 2021-11-11 – 2021-11-12 (×2): 2 mg via ORAL
  Filled 2021-11-10 (×2): qty 2

## 2021-11-10 MED ORDER — THIAMINE MONONITRATE 100 MG PO TABS
100.0000 mg | ORAL_TABLET | Freq: Every day | ORAL | Status: DC
  Administered 2021-11-10 – 2021-11-12 (×3): 100 mg via ORAL
  Filled 2021-11-10 (×3): qty 1

## 2021-11-10 MED ORDER — LORAZEPAM 2 MG/ML IJ SOLN
1.0000 mg | INTRAMUSCULAR | Status: DC | PRN
  Administered 2021-11-10: 2 mg via INTRAVENOUS
  Filled 2021-11-10: qty 1

## 2021-11-10 MED ORDER — NICOTINE 21 MG/24HR TD PT24
21.0000 mg | MEDICATED_PATCH | Freq: Every day | TRANSDERMAL | Status: DC
  Administered 2021-11-10 – 2021-11-12 (×3): 21 mg via TRANSDERMAL
  Filled 2021-11-10 (×3): qty 1

## 2021-11-10 MED ORDER — OXYCODONE HCL 5 MG PO TABS
5.0000 mg | ORAL_TABLET | Freq: Three times a day (TID) | ORAL | Status: DC | PRN
  Administered 2021-11-11 – 2021-11-12 (×5): 5 mg via ORAL
  Filled 2021-11-10 (×5): qty 1

## 2021-11-10 NOTE — TOC Progression Note (Signed)
  Transition of Care [facility]) Screening Note   Patient Details  Name: Ronald Hoffman Date of Birth: [birth year]   Transition of [facility]) CM/SW Contact:    Shade Flood, LCSW Phone Number: 11/10/2021, 11:23 AM    [facility] working on inpatient placement.   Transition of Care Department [facility]) has reviewed patient and no TOC needs have been identified at this time. We will continue to monitor patient advancement through interdisciplinary progression rounds. If new patient transition needs arise, please place a TOC consult.

## 2021-11-10 NOTE — Progress Notes (Signed)
Overheard pt talking to someone that isnt the sitter in the room, pt states that he is "hearing things," Having anxiety, tremors, insomnia, headache etc. Notified MD to get him set up on a CIWA protocol.

## 2021-11-10 NOTE — Progress Notes (Signed)
Progress Note   Patient: Ronald Hoffman NWG:[id number] DOB: 1980/09/16 DOA: 11/08/2021     0 DOS: the patient was seen and examined on 11/10/2021   Brief hospital course: Ronald Hoffman is a 41 y.o. male with medical history significant of history of SVT, polysubstance abuse, schizophrenia, anxiety and delusional disorder.  Patient presented to the ED on 11/08/2021 after missing multiple days of his medications and using recreational drugs that had caused him to experience substance-induced psychotic disorder.  He was seen by TTS with recommendations for inpatient psych treatment.  While waiting for available facility he has experienced multiple vomiting events without episodes of coffee-ground emesis and positive occult blood test on vomiting content.   Case was discussed with gastroenterology service who recommended admission for PPI, fluid resuscitation, antiemetics and clear liquid diet with intention to slowly advance as tolerated.  If require endoscopy evaluation will be provided. [facility] has been contacted to place patient in the hospital for further evaluation and management.   Work-up demonstrating a stable hemoglobin level; with some signs of dehydration and AKI.  Assessment and Plan: * Nausea & vomiting - In the setting of withdrawal symptoms from recent drug use; versus presence of hyperemesis from the use of marijuana. -Continue the use of as needed antiemetics, continue PPI twice a day -Advance diet as tolerated.  Substance-induced psychotic disorder (HCC) - Cessation counseling provided -Topamax and gabapentin recommended by psychiatry service -Given underlying uncontrolled psychosis disorder recommendation for inpatient therapy has been provided. -Currently no beds available -Will continue providing supportive care and follow recommendation by psychiatry service. -Medically stable for discharge to psych facility if available.  Drug abuse (HCC) - Cessation counseling  provided.  Hypokalemia - In the setting of GI losses -Electrolytes has been repleted and within normal limits currently. -Sinew to follow trend.  Leukocytosis -Secondary to demargination -No acute source of infection appreciated -Patient has remained afebrile -WBCs after fluid resuscitation down to 11.4  AKI (acute kidney injury) (HCC) -In the setting of prerenal azotemia and dehydration -Improving/resolved after fluid resuscitation -Continue to follow renal function trend -Continue to maintain medical hydration -Continue minimizing the use of nephrotoxic agents.  SVT (supraventricular tachycardia) - Patient with prior history of SVT -Vital signs are stable currently -Continue treatment with metoprolol.  Coffee ground emesis - Patient experiencing couple episodes of vomiting on 11/08/2021 with a subsequent coffee-ground emesis on the date of admission (10/30/2021). -Most likely in the setting of Mallory-Weiss; gastritis induced by chronic NSAID use also possibility. -Appreciate GI service recommendation -No need for endoscopic evaluation currently if he continues to improve appropriately -Continue PPI twice a day -Avoid the use of NSAIDs -Advance diet as tolerated. -Slight decrease in patient's hemoglobin level in the setting of hemodilution from fluid resuscitation.  Acute psychosis (HCC) - Patient with underlying history of schizophrenia -Per records was noncompliant with his medications prior to admission while acutely using recreational drugs. -Follow psychiatry service for further recommendations -Continue Zyprexa, Topamax and gabapentin. -Continue Ativan for anxiety.  Delusional disorder (HCC) - Most likely in the use by the use of recreational drugs (marijuana and cocaine) -Cessation counseling provided -Continue to follow recommendations by psychiatry service.   Overall hemodynamically stable and ready to be transferred to psychiatric facility if  available.  Subjective:  Overall feeling better; no chest pain, no nausea, no vomiting, no shortness of breath.  Patient is afebrile.  Physical Exam: Vitals:   11/09/21 1950 11/09/21 2354 11/10/21 0800 11/10/21 1500  BP: 122/79 107/60 117/75 Marland Kitchen)  112/56  Pulse: 96 63 81 76  Resp: 18 16 16 16   Temp: 99.3 F (37.4 C) 97.7 F (36.5 C) 98.6 F (37 C) 98.4 F (36.9 C)  TempSrc: Oral Oral Oral Oral  SpO2: 100% 99% 100% 100%  Weight:      Height:       General exam: Alert, awake, calm and able to follow command; delusional and hallucinating.  Throughout the night without sleeping and demonstrating anxiety.  No melena, no hematochezia, no hematemesis, no further episodes of nausea or vomiting.  Patient is afebrile. Respiratory system: Clear to auscultation. Respiratory effort normal.  Good saturation on room air. Cardiovascular system:RRR. No murmurs, rubs, gallops. Gastrointestinal system: Abdomen is nondistended, soft and nontender. No organomegaly or masses felt. Normal bowel sounds heard. Central nervous system: Alert and oriented. No focal neurological deficits. Extremities: No cyanosis or clubbing. Skin: No petechiae. Psychiatry: Judgement and insight impaired with ongoing acute psychosis.  Data Reviewed: CBC: WBCs 11.6, hemoglobin 12.4, platelet count 315 K Comprehensive metabolic panel: Sodium 956, potassium 3.6, chloride 112, bicarb 23, glucose 90, BUN 14, creatinine 0.95 and normal LFTs.  Family Communication: No family at bedside.  Disposition: Status is: Observation The patient remains OBS appropriate and will d/c before 2 midnights.   Planned Discharge Destination: Pending placement at psychiatric facility.  Author: Barton Dubois, MD 11/10/2021 4:02 PM  For on call review [email or web address].

## 2021-11-10 NOTE — Progress Notes (Signed)
Pt keeps pulling out a folded up piece of paper and sniffing it. After sniffing the paper pt continues sniffling nurse made aware. Paper is a folded up [employer] receipt but nothing is in it.

## 2021-11-10 NOTE — Consult Note (Cosign Needed Addendum)
Telepsych Consultation   Reason for Consult: Psych consult Referring Physician: Dr. Estell Harpin Location of Patient: Ronald Hoffman, ED Location of Provider: [facility]  Patient Identification: Ronald Hoffman MRN:  [id number] Principal Diagnosis: Nausea & vomiting Diagnosis:  Principal Problem:   Nausea & vomiting Active Problems:   Delusional disorder (HCC)   Acute psychosis (HCC)   Drug abuse (HCC)   Substance-induced psychotic disorder (HCC)   Coffee ground emesis   SVT (supraventricular tachycardia)   AKI (acute kidney injury) (HCC)   Leukocytosis   Hypokalemia  Total Time spent with patient: 30 minutes  Subjective:   Ronald Hoffman is a 41 y.o. male patient.  HPI: As Per Ronald Hoffman, ED initial intake notes: Ronald Hoffman is a 41 y.o. male with medical history significant of history of SVT, polysubstance abuse, schizophrenia, anxiety and delusional disorder.  Patient presented to the ED on 11/08/2021 after missing multiple days of his medications and using recreational drugs that had caused him to experience substance-induced psychotic disorder.  He was seen by TTS with recommendations for inpatient psych treatment.  While waiting for available facility he has experienced multiple vomiting events without episodes of coffee-ground emesis and positive occult blood test on vomiting content.   Case was discussed with gastroenterology service who recommended admission for PPI, fluid resuscitation, antiemetics and clear liquid diet with intention to slowly advance as tolerated.  If require endoscopy evaluation will be provided. [facility] has been contacted to place patient in the hospital for further evaluation and management.   Work-up demonstrating a stable hemoglobin level; with some signs of dehydration and AKI.  Assessment: Patient is seen and examined lying on the bed at [facility], ED via telepsych.  Appears agitated with increased movement to arms and legs.  Chart reviewed and  findings shared with the treatment team and consults with Dr. Lucianne Muss.  Alert and oriented x3 but speech is illogical, delusional, and tangential.  When asked where patient is at, responds, "I am in extension"  When asked what extension means he continues to say he was in extension.  When asked what brought him to the hospital, reports "I was running an errand because I am in an angel."  Presents with anxious and dysphoric mood with constricted affect.  Thought processes disorganized and thought content is illogical and scattered.  Sensorium with memory fair, judgment impaired, and insight lacking.  Observed both fingers and legs with increased movement and tremor.  When asked about the tremor, patient states, "God let me have hands and legs to be shaky, so that I can unlock the portal."  Abnormal labs obtained on 11/10/21 reviewed, CMP with chloride 112 high, calcium 8.4 low, and total protein 5.9 low.  CBC with differential: White blood cells 11.6 high, hemoglobin 12.4 low, hematocrit 36.5 low, neutrophils 21.2 high, monocyte 1.3 high, immature granulocytes 0.15 high.  Urinalysis: Appearance hazy, bilirubin small, urine Amber, ketones 80, and protein 100, AKI work-up scheduled.  Patient had a GI consult for nausea and vomiting. UDS positive for cocaine and tetrahydrocannabinol.  Patient continues on Zyprexa 10 mg p.o. daily at bedtime for acute psychosis and Topamax 50 mg p.o. daily for mood stabilization and seizures precaution  Patient denies suicidal ideation, when asked about homicidal ideation reports, sometimes if someone attempts to hurt me I will hurt them back.  With visual hallucination, patient states I see a ship sitting in the orbit and is waiting for me to walk in.  Reports that he did  not sleep last night.  Reports his appetite is good and he is safe at home where he lives with his mother.  Denies access to firearms, denies self injurious behavior, and denies family history of mental illness.   Patient reports history of heroin use about 1/2 g daily with last use in 2022.  Reports use of alcohol of 4 beers daily with last use 4 days ago.  Reports smoking cigarette 1 pack daily, and marijuana use of 2 blunts daily.  Instructions provided to patient on cessation of polysubstance usage as it adversely affects the overall psychiatric and medical wellbeing.  Patient states, "Well I am 41 years old and needs to take something."  Reports being followed by Dr. Margo Aye from [facility].  Last seen by their service was 6 months ago.  Disposition: Based on my evaluation of patient, he is of imminent danger to himself and probably others.  He is not psych cleared and continues to require inpatient psychiatric placement.  CSW to continue to seek proper inpatient placement options for patient.                              Past Psychiatric History:  Schizophrenia, polysubstance usage.  Risk to Self:  Yes  Risk to Others:  Potential Prior Inpatient Therapy:  Yes Prior Outpatient Therapy:  Yes at [facility]  Past Medical History:  Past Medical History:  Diagnosis Date   Acute psychosis (HCC) 10/26/2019   Anxiety    Delusional disorder (HCC) 10/25/2019   Drug abuse (HCC) 03/19/2020   Hyperthyroidism    Lactic acidosis 02/21/2021   Seizures (HCC)    stress related; no more seizures since first one 10 years ago; on no meds.   Sepsis (HCC)    SIRS (systemic inflammatory response syndrome) (HCC) 02/20/2021   Substance-induced psychotic disorder (HCC) 03/19/2020   Tachycardia     Past Surgical History:  Procedure Laterality Date   HEMORRHOID SURGERY N/A 11/12/2015   Procedure: EXTENSIVE HEMORRHOIDECTOMY;  Surgeon: Franky Macho, MD;  Location: [facility];  Service: General;  Laterality: N/A;   Family History: History reviewed. No pertinent family history.  Family Psychiatric  History: Denies  Social History:  Social History   Substance and Sexual Activity  Alcohol Use Yes   Comment: last drink 10/25/2019, pt  said he had 3 beers     Social History   Substance and Sexual Activity  Drug Use Yes   Types: Heroin, Methamphetamines, Other-see comments   Comment: was using synthetic heroin daily for 2 years but stopped 2 months ago, methamphetamine daily for 3 months until June (last use 10/25/2019- "just a pinch"), "mushrooms yesterday," and "ketamine today"    Social History   Socioeconomic History   Marital status: Single    Spouse name: Not on file   Number of children: Not on file   Years of education: Not on file   Highest education level: Not on file  Occupational History   Not on file  Tobacco Use   Smoking status: Former    Packs/day: 0.50    Years: 10.00    Total pack years: 5.00    Types: Cigarettes   Smokeless tobacco: Never  Vaping Use   Vaping Use: Every day  Substance and Sexual Activity   Alcohol use: Yes    Comment: last drink 10/25/2019, pt said he had 3 beers   Drug use: Yes    Types: Heroin, Methamphetamines, Other-see comments  Comment: was using synthetic heroin daily for 2 years but stopped 2 months ago, methamphetamine daily for 3 months until June (last use 10/25/2019- "just a pinch"), "mushrooms yesterday," and "ketamine today"   Sexual activity: Never    Birth control/protection: None  Other Topics Concern   Not on file  Social History Narrative   Not on file   Social Determinants of Health   Financial Resource Strain: Not on file  Food Insecurity: Not on file  Transportation Needs: Not on file  Physical Activity: Not on file  Stress: Not on file  Social Connections: Not on file   Additional Social History:    Allergies:   Allergies  Allergen Reactions   Hydrocodone Itching   Penicillins Other (See Comments)    Unknown childhood allergy Has patient had a PCN reaction causing immediate rash, facial/tongue/throat swelling, SOB or lightheadedness with hypotension: unknown Has patient had a PCN reaction causing severe rash involving mucus  membranes or skin necrosis: unknown Has patient had a PCN reaction that required hospitalization: unknown Has patient had a PCN reaction occurring within the last 10 years:no If all of the above answers are "NO", then may proceed with Cephalosporin use.     Labs:  Results for orders placed or performed during the hospital encounter of 11/08/21 (from the past 48 hour(s))  Urinalysis, Routine w reflex microscopic Urine, Clean Catch     Status: Abnormal   Collection Time: 11/08/21  1:31 PM  Result Value Ref Range   Color, Urine AMBER (A) YELLOW    Comment: BIOCHEMICALS MAY BE AFFECTED BY COLOR   APPearance HAZY (A) CLEAR   Specific Gravity, Urine 1.033 (H) 1.005 - 1.030   pH 5.0 5.0 - 8.0   Glucose, UA NEGATIVE NEGATIVE mg/dL   Hgb urine dipstick NEGATIVE NEGATIVE   Bilirubin Urine MODERATE (A) NEGATIVE   Ketones, ur 80 (A) NEGATIVE mg/dL   Protein, ur >=409 (A) NEGATIVE mg/dL   Nitrite NEGATIVE NEGATIVE   Leukocytes,Ua NEGATIVE NEGATIVE   Bacteria, UA NONE SEEN NONE SEEN   Mucus PRESENT    Hyaline Casts, UA PRESENT     Comment: Performed at [facility], [address], [place], [place] [address]  Rapid urine drug screen (hospital performed)     Status: Abnormal   Collection Time: 11/08/21  1:31 PM  Result Value Ref Range   Opiates NONE DETECTED NONE DETECTED   Cocaine POSITIVE (A) NONE DETECTED   Benzodiazepines NONE DETECTED NONE DETECTED   Amphetamines NONE DETECTED NONE DETECTED   Tetrahydrocannabinol POSITIVE (A) NONE DETECTED   Barbiturates NONE DETECTED NONE DETECTED    Comment: (NOTE) DRUG SCREEN FOR MEDICAL PURPOSES ONLY.  IF CONFIRMATION IS NEEDED FOR ANY PURPOSE, NOTIFY LAB WITHIN 5 DAYS.  LOWEST DETECTABLE LIMITS FOR URINE DRUG SCREEN Drug Class                     Cutoff (ng/mL) Amphetamine and metabolites    1000 Barbiturate and metabolites    200 Benzodiazepine                 200 Tricyclics and metabolites     300 Opiates and metabolites         300 Cocaine and metabolites        300 THC                            50 Performed at [facility], [address] [place], [place] [address]   Comprehensive metabolic panel     Status: Abnormal   Collection Time: 11/08/21  1:56 PM  Result Value Ref Range   Sodium 140 135 - 145 mmol/L   Potassium 3.3 (L) 3.5 - 5.1 mmol/L   Chloride 106 98 - 111 mmol/L   CO2 25 22 - 32 mmol/L   Glucose, Bld 102 (H) 70 - 99 mg/dL    Comment: Glucose reference range applies only to samples taken after fasting for at least 8 hours.   BUN 14 6 - 20 mg/dL   Creatinine, Ser 6.04 0.61 - 1.24 mg/dL   Calcium 9.1 8.9 - 54.0 mg/dL   Total Protein 7.4 6.5 - 8.1 g/dL   Albumin 4.3 3.5 - 5.0 g/dL   AST 15 15 - 41 U/L   ALT 17 0 - 44 U/L   Alkaline Phosphatase 82 38 - 126 U/L   Total Bilirubin 0.9 0.3 - 1.2 mg/dL   GFR, Estimated >98 >11 mL/min    Comment: (NOTE) Calculated using the CKD-EPI Creatinine Equation (2021)    Anion gap 9 5 - 15    Comment: Performed at [facility], [address], [place], [place] [address]  CBC with Differential     Status: None   Collection Time: 11/08/21  1:56 PM  Result Value Ref Range   WBC 9.3 4.0 - 10.5 K/uL   RBC 5.05 4.22 - 5.81 MIL/uL   Hemoglobin 14.1 13.0 - 17.0 g/dL   HCT 29.5 62.1 - 30.8 %   MCV 80.6 80.0 - 100.0 fL   MCH 27.9 26.0 - 34.0 pg   MCHC 34.6 30.0 - 36.0 g/dL   RDW 65.7 84.6 - 96.2 %   Platelets 300 150 - 400 K/uL   nRBC 0.0 0.0 - 0.2 %   Neutrophils Relative % 80 %   Neutro Abs 7.4 1.7 - 7.7 K/uL   Lymphocytes Relative 13 %   Lymphs Abs 1.2 0.7 - 4.0 K/uL   Monocytes Relative 6 %   Monocytes Absolute 0.5 0.1 - 1.0 K/uL   Eosinophils Relative 1 %   Eosinophils Absolute 0.1 0.0 - 0.5 K/uL   Basophils Relative 0 %   Basophils Absolute 0.0 0.0 - 0.1 K/uL   Immature Granulocytes 0 %   Abs Immature Granulocytes 0.02 0.00 - 0.07 K/uL    Comment: Performed at [facility], [address], [place], [place] [address]  Resp Panel by RT-PCR (Flu A&B,  Covid) Anterior Nasal Swab     Status: None   Collection Time: 11/09/21 10:31 AM   Specimen: Anterior Nasal Swab  Result Value Ref Range   SARS Coronavirus 2 by RT PCR NEGATIVE NEGATIVE    Comment: (NOTE) SARS-CoV-2 target nucleic acids are NOT DETECTED.  The SARS-CoV-2 RNA is generally detectable in upper respiratory specimens during the acute phase of infection. The lowest concentration of SARS-CoV-2 viral copies this assay can detect is 138 copies/mL. A negative result does not preclude SARS-Cov-2 infection and should not be used as the sole basis for treatment or other patient management decisions. A negative result may occur with  improper specimen collection/handling, submission of specimen other than nasopharyngeal swab, presence of viral mutation(s) within the areas targeted by this assay, and inadequate number of viral copies(<138 copies/mL). A negative result must be combined with clinical observations, patient history, and epidemiological information. The expected result is Negative.  Fact Sheet for Patients:  BloggerCourse.com  Fact Sheet for Healthcare Providers:  SeriousBroker.ithttps://www.fda.gov/media/152162/download  This test is no t yet approved or cleared by the [place] FDA and  has been authorized for detection and/or diagnosis of SARS-CoV-2 by FDA under an Emergency Use Authorization (EUA). This EUA will remain  in effect (meaning this test can be used) for the duration of the COVID-19 declaration under Section 564(b)(1) of the Act, 21 U.S.C.section 360bbb-3(b)(1), unless the authorization is terminated  or revoked sooner.       Influenza A by PCR NEGATIVE NEGATIVE   Influenza B by PCR NEGATIVE NEGATIVE    Comment: (NOTE) The Xpert Xpress SARS-CoV-2/FLU/RSV plus assay is intended as an aid in the diagnosis of influenza from Nasopharyngeal swab specimens and should not be used as a sole basis for treatment. Nasal washings and aspirates  are unacceptable for Xpert Xpress SARS-CoV-2/FLU/RSV testing.  Fact Sheet for Patients: BloggerCourse.comhttps://www.fda.gov/media/152166/download  Fact Sheet for Healthcare Providers: SeriousBroker.ithttps://www.fda.gov/media/152162/download  This test is not yet approved or cleared by the [place] FDA and has been authorized for detection and/or diagnosis of SARS-CoV-2 by FDA under an Emergency Use Authorization (EUA). This EUA will remain in effect (meaning this test can be used) for the duration of the COVID-19 declaration under Section 564(b)(1) of the Act, 21 U.S.C. section 360bbb-3(b)(1), unless the authorization is terminated or revoked.  Performed at [facility], [address] [place], [place] [address]   CBC with Differential     Status: Abnormal   Collection Time: 11/09/21 10:47 AM  Result Value Ref Range   WBC 24.0 (H) 4.0 - 10.5 K/uL   RBC 5.64 4.22 - 5.81 MIL/uL   Hemoglobin 15.9 13.0 - 17.0 g/dL   HCT 60.445.7 54.039.0 - 98.152.0 %   MCV 81.0 80.0 - 100.0 fL   MCH 28.2 26.0 - 34.0 pg   MCHC 34.8 30.0 - 36.0 g/dL   RDW 19.113.3 47.811.5 - 29.515.5 %   Platelets 496 (H) 150 - 400 K/uL   nRBC 0.0 0.0 - 0.2 %   Neutrophils Relative % 89 %   Neutro Abs 21.2 (H) 1.7 - 7.7 K/uL   Lymphocytes Relative 5 %   Lymphs Abs 1.3 0.7 - 4.0 K/uL   Monocytes Relative 5 %   Monocytes Absolute 1.3 (H) 0.1 - 1.0 K/uL   Eosinophils Relative 0 %   Eosinophils Absolute 0.0 0.0 - 0.5 K/uL   Basophils Relative 0 %   Basophils Absolute 0.1 0.0 - 0.1 K/uL   Immature Granulocytes 1 %   Abs Immature Granulocytes 0.15 (H) 0.00 - 0.07 K/uL    Comment: Performed at [facility], [address] [place], [place] [address]  Comprehensive metabolic panel     Status: Abnormal   Collection Time: 11/09/21 10:47 AM  Result Value Ref Range   Sodium 145 135 - 145 mmol/L   Potassium 3.4 (L) 3.5 - 5.1 mmol/L   Chloride 102 98 - 111 mmol/L   CO2 20 (L) 22 - 32 mmol/L   Glucose, Bld 140 (H) 70 - 99 mg/dL    Comment: Glucose reference range  applies only to samples taken after fasting for at least 8 hours.   BUN 21 (H) 6 - 20 mg/dL   Creatinine, Ser 8.651.46 (H) 0.61 - 1.24 mg/dL   Calcium 78.410.7 (H) 8.9 - 10.3 mg/dL   Total Protein 9.6 (H) 6.5 - 8.1 g/dL   Albumin 5.4 (H) 3.5 - 5.0 g/dL   AST 23 15 - 41 U/L   ALT 22 0 - 44 U/L  Alkaline Phosphatase 101 38 - 126 U/L   Total Bilirubin 1.4 (H) 0.3 - 1.2 mg/dL   GFR, Estimated >18 >84 mL/min    Comment: (NOTE) Calculated using the CKD-EPI Creatinine Equation (2021)    Anion gap 23 (H) 5 - 15    Comment: Electrolytes repeated to confirm. Performed at [facility], [address], [place], [place] [address]   Lipase, blood     Status: None   Collection Time: 11/09/21 10:47 AM  Result Value Ref Range   Lipase 29 11 - 51 U/L    Comment: Performed at [facility], [address], [place], [place] [address]  Urinalysis, Routine w reflex microscopic Urine, Clean Catch     Status: Abnormal   Collection Time: 11/09/21 11:33 AM  Result Value Ref Range   Color, Urine AMBER (A) YELLOW    Comment: BIOCHEMICALS MAY BE AFFECTED BY COLOR   APPearance HAZY (A) CLEAR   Specific Gravity, Urine 1.029 1.005 - 1.030   pH 5.0 5.0 - 8.0   Glucose, UA NEGATIVE NEGATIVE mg/dL   Hgb urine dipstick NEGATIVE NEGATIVE   Bilirubin Urine SMALL (A) NEGATIVE   Ketones, ur 80 (A) NEGATIVE mg/dL   Protein, ur 109 (A) NEGATIVE mg/dL   Nitrite NEGATIVE NEGATIVE   Leukocytes,Ua NEGATIVE NEGATIVE   RBC / HPF 0-5 0 - 5 RBC/hpf   WBC, UA 0-5 0 - 5 WBC/hpf   Bacteria, UA NONE SEEN NONE SEEN   Squamous Epithelial / LPF 0-5 0 - 5   Mucus PRESENT    Hyaline Casts, UA PRESENT     Comment: Performed at [facility], [address] [place], [place] [address]  CK     Status: None   Collection Time: 11/09/21  6:16 PM  Result Value Ref Range   Total CK 80 49 - 397 U/L    Comment: Performed at [facility], [address] [place], [place] [address]  Magnesium     Status: None   Collection Time: 11/09/21  6:16 PM   Result Value Ref Range   Magnesium 2.3 1.7 - 2.4 mg/dL    Comment: Performed at [facility], [address], [place], [place] [address]  CBC     Status: Abnormal   Collection Time: 11/10/21  5:15 AM  Result Value Ref Range   WBC 11.6 (H) 4.0 - 10.5 K/uL   RBC 4.44 4.22 - 5.81 MIL/uL   Hemoglobin 12.4 (L) 13.0 - 17.0 g/dL   HCT 06.2 (L) 37.6 - 28.3 %   MCV 82.2 80.0 - 100.0 fL   MCH 27.9 26.0 - 34.0 pg   MCHC 34.0 30.0 - 36.0 g/dL   RDW 15.1 76.1 - 60.7 %   Platelets 315 150 - 400 K/uL   nRBC 0.0 0.0 - 0.2 %    Comment: Performed at [facility], [address], [place], [place] [address]  Comprehensive metabolic panel     Status: Abnormal   Collection Time: 11/10/21  5:15 AM  Result Value Ref Range   Sodium 140 135 - 145 mmol/L   Potassium 3.6 3.5 - 5.1 mmol/L   Chloride 112 (H) 98 - 111 mmol/L   CO2 23 22 - 32 mmol/L   Glucose, Bld 90 70 - 99 mg/dL    Comment: Glucose reference range applies only to samples taken after fasting for at least 8 hours.   BUN 14 6 - 20 mg/dL   Creatinine, Ser 2.69 0.61 - 1.24 mg/dL   Calcium  8.4 (L) 8.9 - 10.3 mg/dL   Total Protein 5.9 (L) 6.5 - 8.1 g/dL   Albumin 3.7 3.5 - 5.0 g/dL   AST 15 15 - 41 U/L   ALT 14 0 - 44 U/L   Alkaline Phosphatase 62 38 - 126 U/L   Total Bilirubin 0.9 0.3 - 1.2 mg/dL   GFR, Estimated >16 >10 mL/min    Comment: (NOTE) Calculated using the CKD-EPI Creatinine Equation (2021)    Anion gap 5 5 - 15    Comment: Performed at [facility], [address], [place], [place] [address]    Medications:  Current Facility-Administered Medications  Medication Dose Route Frequency Provider Last Rate Last Admin   0.9 %  sodium chloride infusion   Intravenous Continuous Vassie Loll, MD 125 mL/hr at 11/09/21 1657 New Bag at 11/09/21 1657   acetaminophen (TYLENOL) tablet 650 mg  650 mg Oral Q6H PRN Vassie Loll, MD       Or   acetaminophen (TYLENOL) suppository 650 mg  650 mg Rectal Q6H PRN Vassie Loll, MD        folic acid (FOLVITE) tablet 1 mg  1 mg Oral Daily Vassie Loll, MD   1 mg at 11/10/21 0955   gabapentin (NEURONTIN) capsule 400 mg  400 mg Oral TID Bethann Berkshire, MD   400 mg at 11/10/21 4098   LORazepam (ATIVAN) injection 0-4 mg  0-4 mg Intravenous Q6H Vassie Loll, MD   2 mg at 11/10/21 0955   Followed by   Melene Muller ON 11/12/2021] LORazepam (ATIVAN) injection 0-4 mg  0-4 mg Intravenous Q12H Vassie Loll, MD       LORazepam (ATIVAN) tablet 1-4 mg  1-4 mg Oral Q1H PRN Vassie Loll, MD       Or   LORazepam (ATIVAN) injection 1-4 mg  1-4 mg Intravenous Q1H PRN Vassie Loll, MD       menthol-cetylpyridinium (CEPACOL) lozenge 3 mg  1 lozenge Oral PRN Bethann Berkshire, MD   3 mg at 11/09/21 1321   metoprolol tartrate (LOPRESSOR) tablet 50 mg  50 mg Oral BID Bethann Berkshire, MD   50 mg at 11/10/21 1191   multivitamin with minerals tablet 1 tablet  1 tablet Oral Daily Vassie Loll, MD   1 tablet at 11/10/21 0955   nicotine (NICODERM CQ - dosed in mg/24 hours) patch 21 mg  21 mg Transdermal Daily Zierle-Ghosh, Asia B, DO   21 mg at 11/10/21 0812   OLANZapine (ZYPREXA) tablet 10 mg  10 mg Oral Marvis Moeller, MD   10 mg at 11/09/21 2150   pantoprazole (PROTONIX) EC tablet 40 mg  40 mg Oral BID Vassie Loll, MD   40 mg at 11/10/21 4782   prochlorperazine (COMPAZINE) injection 10 mg  10 mg Intravenous Q6H PRN Vassie Loll, MD       thiamine (VITAMIN B1) tablet 100 mg  100 mg Oral Daily Vassie Loll, MD   100 mg at 11/10/21 9562   Or   thiamine (VITAMIN B1) injection 100 mg  100 mg Intravenous Daily Vassie Loll, MD       topiramate (TOPAMAX) tablet 50 mg  50 mg Oral Daily Ophelia Shoulder E, NP   50 mg at 11/10/21 1308    Musculoskeletal: Strength & Muscle Tone: within normal limits Gait & Station: normal Patient leans: N/A  Psychiatric Specialty Exam:  Presentation  General Appearance:  Appropriate for Environment; Casual; Fairly Groomed  Eye Contact: Fair  Speech: Clear and  Coherent; Normal Rate  Speech Volume: Normal  Handedness: Right  Mood and Affect  Mood: Anxious; Dysphoric  Affect: Congruent; Constricted  Thought Process  Thought Processes: Coherent; Disorganized  Descriptions of Associations:Loose  Orientation:Partial  Thought Content:Illogical; Scattered  History of Schizophrenia/Schizoaffective disorder:Yes  Duration of Psychotic Symptoms:Greater than six months  Hallucinations:Hallucinations: Auditory; Visual Description of Auditory Hallucinations: Hearing a bunch of voices Description of Visual Hallucinations: Seeing a ship in the orbit sitting and waiting on making.  Ideas of Reference:Delusions  Suicidal Thoughts:Suicidal Thoughts: No  Homicidal Thoughts:Homicidal Thoughts: No  Sensorium  Memory: Immediate Fair  Judgment: Impaired  Insight: Lacking  Executive Functions  Concentration: Fair  Attention Span: Fair  Recall: Poor  Fund of Knowledge: Fair  Language: Fair  Psychomotor Activity  Psychomotor Activity: Psychomotor Activity: Increased  Assets  Assets: Communication Skills; Physical Health; Social Support  Sleep  Sleep: Sleep: Poor Number of Hours of Sleep: 0  Physical Exam: Physical Exam Vitals and nursing note reviewed.  HENT:     Head: Normocephalic.     Right Ear: External ear normal.     Left Ear: External ear normal.     Nose: Nose normal.     Mouth/Throat:     Mouth: Mucous membranes are moist.     Pharynx: Oropharynx is clear.  Eyes:     Extraocular Movements: Extraocular movements intact.     Conjunctiva/sclera: Conjunctivae normal.     Pupils: Pupils are equal, round, and reactive to light.  Cardiovascular:     Rate and Rhythm: Normal rate.     Pulses: Normal pulses.  Pulmonary:     Effort: Pulmonary effort is normal.  Abdominal:     Palpations: Abdomen is soft.  Genitourinary:    Comments: Deferred Musculoskeletal:     Cervical back: Normal range of motion  and neck supple.  Skin:    General: Skin is warm.  Neurological:     General: No focal deficit present.     Mental Status: He is alert and oriented to person, place, and time.  Psychiatric:     Comments: Agitated   Review of Systems  Constitutional: Negative.  Negative for chills and fever.  HENT: Negative.  Negative for hearing loss and tinnitus.   Eyes: Negative.  Negative for blurred vision and double vision.  Respiratory: Negative.  Negative for cough and hemoptysis.   Cardiovascular: Negative.  Negative for chest pain and palpitations.  Gastrointestinal:  Positive for vomiting (History of vomiting). Negative for abdominal pain, constipation, diarrhea, heartburn and nausea.  Genitourinary: Negative.  Negative for dysuria, frequency and urgency.  Musculoskeletal: Negative.  Negative for back pain, myalgias and neck pain.  Skin: Negative.  Negative for itching and rash.  Neurological:  Positive for tremors (Tremor to both legs and hands) and seizures (History of seizures). Negative for dizziness and headaches.  Endo/Heme/Allergies: Negative.  Negative for environmental allergies and polydipsia. Does not bruise/bleed easily.       Hydrocodone Hydrocodone  Itching Not Specified  11/06/2015 Deletion Reason:  Penicillins Penicillins  Other (See Comments) Not Specified  08/11/2011    Psychiatric/Behavioral:  Positive for substance abuse. The patient is nervous/anxious.    Blood pressure 117/75, pulse 81, temperature 98.6 F (37 C), temperature source Oral, resp. rate 16, height 5\' 5"  (1.651 m), weight 62.4 kg, SpO2 100 %. Body mass index is 22.88 kg/m.  Treatment Plan Summary: Daily contact with patient to assess and evaluate symptoms and progress in treatment and Medication management  Disposition: Recommend  psychiatric Inpatient admission when medically cleared.  This service was provided via telemedicine using a 2-way, interactive audio and video technology.  Names of all persons  participating in this telemedicine service and their role in this encounter. Name: Salena Saner Role: Patient  Name: Alan Mulder, NP Role: Provider  Name: Dr. Lucianne Muss Role: Medical Director  Name: Dr. Estell Harpin Role: Ronald Hoffman, ED physician    Cecilie Lowers, FNP 11/10/2021 1:16 PM

## 2021-11-10 NOTE — Progress Notes (Signed)
Per [name], NP, patient meets criteria for inpatient treatment. There are no available beds at [facility] today. CSW re-faxed referrals to the following facilities for review:  [address] [place] [address] [phone] --  [place] [address] [place] [address] [phone] --  [place] [address] [place] [address] [phone] --  [place]  Pending - Request Sent N/A [place], [place] [address] [phone] --  [place] [address] [place], [place] [address] [phone] --  [place] [address] [place] [address] [phone] --  [facility]  Pending - Request Sent N/A [address], [place] [address] [place] [address], [place] [address] [phone] --  [facility]  Pending - Request Sent N/A [address] [place] [address] [phone] --  [facility]  Pending - Request Sent N/A [address], [place] [address] [phone] --  [facility]  Pending - Request Sent N/A [place], [place] [address] [phone] --  [facility]  Pending - Request Sent N/A [address], [place] [address] [phone] --  [facility]  Pending - Request Sent N/A [address] [place] [address] [phone] --  [facility]  Pending - Request Sent N/A [address] [place] [address] [phone] --   TTS will continue to seek bed placement.  Glennie Isle, MSW, [facility] Phone: [phone] Disposition/TOC

## 2021-11-10 NOTE — Consult Note (Signed)
Consulting  Provider: Dr. Estell Harpin Primary Care Physician:  Pcp, No Primary Gastroenterologist: Previously unassigned  Reason for Consultation: Nausea vomiting, coffee-ground emesis  HPI:  Ronald Hoffman is a 41 y.o. male with a past medical history of polysubstance abuse, schizophrenia, anxiety, delusional disorder, who presented to [facility], ER 11/08/2021 after missing multiple days of his medications, recreational drug use with subsequent substance-induced psychotic disorder.  While awaiting inpatient psych treatment, patient suffered multiple vomiting episodes.  Initially this was nonbloody, no coffee-ground.  A few episodes later he did note to have coffee-ground emesis which was heme positive.  Patient denies any abdominal pain.  States he does take Advil 1-2 times daily.  Has been doing this for nearly 6 months.  Takes this for back pain and leg pain.  Has been started on IV PPI.  Did have a drop in hemoglobin from 15.9-12.4 though this was likely hemodilution as his WBC and platelets also dropped in similar fashion.  Today states he feels better.  Has not had any nausea and vomiting since yesterday morning.  No melena or hematochezia.  No previous upper endoscopy.  No previous colonoscopy.  Past Medical History:  Diagnosis Date   Acute psychosis (HCC) 10/26/2019   Anxiety    Delusional disorder (HCC) 10/25/2019   Drug abuse (HCC) 03/19/2020   Hyperthyroidism    Lactic acidosis 02/21/2021   Seizures (HCC)    stress related; no more seizures since first one 10 years ago; on no meds.   Sepsis (HCC)    SIRS (systemic inflammatory response syndrome) (HCC) 02/20/2021   Substance-induced psychotic disorder (HCC) 03/19/2020   Tachycardia     Past Surgical History:  Procedure Laterality Date   HEMORRHOID SURGERY N/A 11/12/2015   Procedure: EXTENSIVE HEMORRHOIDECTOMY;  Surgeon: Franky Macho, MD;  Location: [facility];  Service: General;  Laterality: N/A;    Prior to Admission medications    Medication Sig Start Date End Date Taking? Authorizing Provider  gabapentin (NEURONTIN) 400 MG capsule Take 1 capsule (400 mg total) by mouth 3 (three) times daily. Patient not taking: Reported on 11/08/2021 02/25/21   Catarina Hartshorn, MD  metoprolol tartrate (LOPRESSOR) 50 MG tablet Take 1 tablet (50 mg total) by mouth 2 (two) times daily. Patient not taking: Reported on 11/08/2021 02/25/21   Catarina Hartshorn, MD  OLANZapine (ZYPREXA) 10 MG tablet Take 1 tablet (10 mg total) by mouth at bedtime. Patient not taking: Reported on 11/08/2021 02/25/21   Catarina Hartshorn, MD    Current Facility-Administered Medications  Medication Dose Route Frequency Provider Last Rate Last Admin   0.9 %  sodium chloride infusion   Intravenous Continuous Vassie Loll, MD 125 mL/hr at 11/09/21 1657 New Bag at 11/09/21 1657   acetaminophen (TYLENOL) tablet 650 mg  650 mg Oral Q6H PRN Vassie Loll, MD       Or   acetaminophen (TYLENOL) suppository 650 mg  650 mg Rectal Q6H PRN Vassie Loll, MD       folic acid (FOLVITE) tablet 1 mg  1 mg Oral Daily Vassie Loll, MD   1 mg at 11/10/21 0955   gabapentin (NEURONTIN) capsule 400 mg  400 mg Oral TID Bethann Berkshire, MD   400 mg at 11/10/21 2080   LORazepam (ATIVAN) injection 0-4 mg  0-4 mg Intravenous Q6H Vassie Loll, MD   2 mg at 11/10/21 0955   Followed by   Melene Muller ON 11/12/2021] LORazepam (ATIVAN) injection 0-4 mg  0-4 mg Intravenous Q12H Vassie Loll, MD  LORazepam (ATIVAN) tablet 1-4 mg  1-4 mg Oral Q1H PRN Barton Dubois, MD       Or   LORazepam (ATIVAN) injection 1-4 mg  1-4 mg Intravenous Q1H PRN Barton Dubois, MD       menthol-cetylpyridinium (CEPACOL) lozenge 3 mg  1 lozenge Oral PRN Milton Ferguson, MD   3 mg at 11/09/21 1321   metoprolol tartrate (LOPRESSOR) tablet 50 mg  50 mg Oral BID Milton Ferguson, MD   50 mg at 11/10/21 [id number]   multivitamin with minerals tablet 1 tablet  1 tablet Oral Daily Barton Dubois, MD   1 tablet at 11/10/21 0955   nicotine (NICODERM  CQ - dosed in mg/24 hours) patch 21 mg  21 mg Transdermal Daily Zierle-Ghosh, Asia B, DO   21 mg at 11/10/21 0812   OLANZapine (ZYPREXA) tablet 10 mg  10 mg Oral Lauris Chroman, MD   10 mg at 11/09/21 2150   pantoprazole (PROTONIX) EC tablet 40 mg  40 mg Oral BID Barton Dubois, MD   40 mg at 11/10/21 [id number]   prochlorperazine (COMPAZINE) injection 10 mg  10 mg Intravenous Q6H PRN Barton Dubois, MD       thiamine (VITAMIN B1) tablet 100 mg  100 mg Oral Daily Barton Dubois, MD   100 mg at 11/10/21 [id number]   Or   thiamine (VITAMIN B1) injection 100 mg  100 mg Intravenous Daily Barton Dubois, MD       topiramate (TOPAMAX) tablet 50 mg  50 mg Oral Daily Merlyn Lot E, NP   50 mg at 11/10/21 [id number]    Allergies as of 11/08/2021 - Review Complete 11/08/2021  Allergen Reaction Noted   Hydrocodone Itching 11/06/2015   Penicillins Other (See Comments) 08/11/2011    History reviewed. No pertinent family history.  Social History   Socioeconomic History   Marital status: Single    Spouse name: Not on file   Number of children: Not on file   Years of education: Not on file   Highest education level: Not on file  Occupational History   Not on file  Tobacco Use   Smoking status: Former    Packs/day: 0.50    Years: 10.00    Total pack years: 5.00    Types: Cigarettes   Smokeless tobacco: Never  Vaping Use   Vaping Use: Every day  Substance and Sexual Activity   Alcohol use: Yes    Comment: last drink 10/25/2019, pt said he had 3 beers   Drug use: Yes    Types: Heroin, Methamphetamines, Other-see comments    Comment: was using synthetic heroin daily for 2 years but stopped 2 months ago, methamphetamine daily for 3 months until June (last use 10/25/2019- "just a pinch"), "mushrooms yesterday," and "ketamine today"   Sexual activity: Never    Birth control/protection: None  Other Topics Concern   Not on file  Social History Narrative   Not on file   Social Determinants of Health    Financial Resource Strain: Not on file  Food Insecurity: Not on file  Transportation Needs: Not on file  Physical Activity: Not on file  Stress: Not on file  Social Connections: Not on file  Intimate Partner Violence: Not on file    Review of Systems: General: Negative for anorexia, weight loss, fever, chills, fatigue, weakness. Eyes: Negative for vision changes.  ENT: Negative for hoarseness, difficulty swallowing , nasal congestion. CV: Negative for chest pain, angina, palpitations, dyspnea on exertion, peripheral edema.  Respiratory: Negative for dyspnea at rest, dyspnea on exertion, cough, sputum, wheezing.  GI: See history of present illness. GU:  Negative for dysuria, hematuria, urinary incontinence, urinary frequency, nocturnal urination.  MS: Negative for joint pain, low back pain.  Derm: Negative for rash or itching.  Neuro: Negative for weakness, abnormal sensation, seizure, frequent headaches, memory loss, confusion.  Psych: Negative for anxiety, depression Endo: Negative for unusual weight change.  Heme: Negative for bruising or bleeding. Allergy: Negative for rash or hives.  Physical Exam: Vital signs in last 24 hours: Temp:  [97.7 F (36.5 C)-99.3 F (37.4 C)] 98.6 F (37 C) (10/01 0800) Pulse Rate:  [63-96] 81 (10/01 0800) Resp:  [12-24] 16 (10/01 0800) BP: (97-126)/(60-79) 117/75 (10/01 0800) SpO2:  [91 %-100 %] 100 % (10/01 0800) Weight:  [62.4 kg] 62.4 kg (09/30 1649) Last BM Date : 11/09/21 General:   Alert,  Well-developed, well-nourished, pleasant and cooperative in NAD Head:  Normocephalic and atraumatic. Eyes:  Sclera clear, no icterus.   Conjunctiva pink. Ears:  Normal auditory acuity. Nose:  No deformity, discharge,  or lesions. Mouth:  No deformity or lesions, dentition normal. Neck:  Supple; no masses or thyromegaly. Lungs:  Clear throughout to auscultation.   No wheezes, crackles, or rhonchi. No acute distress. Heart:  Regular rate and  rhythm; no murmurs, clicks, rubs,  or gallops. Abdomen:  Soft, nontender and nondistended. No masses, hepatosplenomegaly or hernias noted. Normal bowel sounds, without guarding, and without rebound.   Msk:  Symmetrical without gross deformities. Normal posture. Pulses:  Normal pulses noted. Extremities:  Without clubbing or edema. Neurologic:  Alert and  oriented x4;  grossly normal neurologically. Skin:  Intact without significant lesions or rashes. Cervical Nodes:  No significant cervical adenopathy. Psych:  Alert and cooperative. Normal mood and affect.  Intake/Output from previous day: 09/30 0701 - 10/01 0700 In: 1601.8 [P.O.:600; I.V.:1.8; IV Piggyback:1000] Out: -  Intake/Output this shift: No intake/output data recorded.  Lab Results: Recent Labs    11/08/21 1356 11/09/21 1047 11/10/21 0515  WBC 9.3 24.0* 11.6*  HGB 14.1 15.9 12.4*  HCT 40.7 45.7 36.5*  PLT 300 496* 315   BMET Recent Labs    11/08/21 1356 11/09/21 1047 11/10/21 0515  NA 140 145 140  K 3.3* 3.4* 3.6  CL 106 102 112*  CO2 25 20* 23  GLUCOSE 102* 140* 90  BUN 14 21* 14  CREATININE 0.93 1.46* 0.95  CALCIUM 9.1 10.7* 8.4*   LFT Recent Labs    11/08/21 1356 11/09/21 1047 11/10/21 0515  PROT 7.4 9.6* 5.9*  ALBUMIN 4.3 5.4* 3.7  AST 15 23 15   ALT 17 22 14   ALKPHOS 82 101 62  BILITOT 0.9 1.4* 0.9   PT/INR No results for input(s): "LABPROT", "INR" in the last 72 hours. Hepatitis Panel No results for input(s): "HEPBSAG", "HCVAB", "HEPAIGM", "HEPBIGM" in the last 72 hours. C-Diff No results for input(s): "CDIFFTOX" in the last 72 hours.  Studies/Results: CT ABDOMEN PELVIS W CONTRAST  Result Date: 11/09/2021 CLINICAL DATA:  Abdominal pain. EXAM: CT ABDOMEN AND PELVIS WITH CONTRAST TECHNIQUE: Multidetector CT imaging of the abdomen and pelvis was performed using the standard protocol following bolus administration of intravenous contrast. RADIATION DOSE REDUCTION: This exam was performed  according to the departmental dose-optimization program which includes automated exposure control, adjustment of the mA and/or kV according to patient size and/or use of iterative reconstruction technique. CONTRAST:  167mL OMNIPAQUE IOHEXOL 300 MG/ML  SOLN COMPARISON:  02/22/2021. FINDINGS:  Lower chest: Clear lung bases. Hepatobiliary: No focal liver abnormality is seen. No gallstones, gallbladder wall thickening, or biliary dilatation. Pancreas: Unremarkable. No pancreatic ductal dilatation or surrounding inflammatory changes. Spleen: Normal in size without focal abnormality. Adrenals/Urinary Tract: Normal adrenal glands. Kidneys normal in size, orientation and position with symmetric enhancement. 8 mm, low-attenuation left midpole renal mass consistent with a cyst, stable. No follow-up recommended. No other renal masses, no stones and no hydronephrosis. Normal ureters. Bladder decompressed, otherwise unremarkable. Stomach/Bowel: Stomach is within normal limits. Appendix appears normal. No evidence of bowel wall thickening, distention, or inflammatory changes. Vascular/Lymphatic: No significant vascular findings are present. No enlarged abdominal or pelvic lymph nodes. Reproductive: Unremarkable. Other: No abdominal wall hernia or abnormality. No abdominopelvic ascites. Musculoskeletal: No acute or significant osseous findings. IMPRESSION: 1. No acute findings within the abdomen or pelvis. No findings to account for abdominal pain. Electronically Signed   By: Lajean Manes M.D.   On: 11/09/2021 12:17    Impression: *Nausea and vomiting *Coffee-ground emesis *Chronic NSAID use  Plan: Patient likely with gastritis in the setting of polysubstance abuse, alcohol use, NSAID use. Mallory-Weiss tear also on differential.  No abdominal pain.  Drop in hemoglobin likely due to hemodilution.  Has had no further nausea or vomiting since being admitted.    I think it is reasonable to treat conservatively for now.   Advance diet as tolerated.  Continue on PPI twice daily.  Avoid NSAIDs.  If patient has further evidence of GI bleeding or further nausea vomiting, will consider further evaluation with upper endoscopy.  Otherwise continue supportive care.    Elon Alas. Abbey Chatters, D.O. Gastroenterology and Hepatology [facility]    LOS: 0 days     11/10/2021, 11:37 AM

## 2021-11-11 DIAGNOSIS — F19159 Other psychoactive substance abuse with psychoactive substance-induced psychotic disorder, unspecified: Secondary | ICD-10-CM | POA: Diagnosis not present

## 2021-11-11 DIAGNOSIS — F22 Delusional disorders: Secondary | ICD-10-CM | POA: Diagnosis present

## 2021-11-11 DIAGNOSIS — R112 Nausea with vomiting, unspecified: Secondary | ICD-10-CM | POA: Diagnosis not present

## 2021-11-11 LAB — CBC
HCT: 36.5 % — ABNORMAL LOW (ref 39.0–52.0)
HCT: 35.8 % — ABNORMAL LOW (ref 39.0–52.0)
Hemoglobin: 12.4 g/dL — ABNORMAL LOW (ref 13.0–17.0)
Hemoglobin: 12.3 g/dL — ABNORMAL LOW (ref 13.0–17.0)
MCH: 27.9 pg (ref 26.0–34.0)
MCH: 27.8 pg (ref 26.0–34.0)
MCHC: 34 g/dL (ref 30.0–36.0)
MCHC: 34.4 g/dL (ref 30.0–36.0)
MCV: 82.2 fL (ref 80.0–100.0)
MCV: 81 fL (ref 80.0–100.0)
Platelets: 315 10*3/uL (ref 150–400)
Platelets: 263 10*3/uL (ref 150–400)
RBC: 4.44 MIL/uL (ref 4.22–5.81)
RBC: 4.42 MIL/uL (ref 4.22–5.81)
RDW: 13.3 % (ref 11.5–15.5)
RDW: 13.3 % (ref 11.5–15.5)
WBC: 11.6 10*3/uL — ABNORMAL HIGH (ref 4.0–10.5)
WBC: 9.2 10*3/uL (ref 4.0–10.5)
nRBC: 0 % (ref 0.0–0.2)
nRBC: 0 % (ref 0.0–0.2)

## 2021-11-11 LAB — COMPREHENSIVE METABOLIC PANEL
ALT: 14 U/L (ref 0–44)
AST: 15 U/L (ref 15–41)
Albumin: 3.7 g/dL (ref 3.5–5.0)
Alkaline Phosphatase: 62 U/L (ref 38–126)
Anion gap: 5 (ref 5–15)
BUN: 14 mg/dL (ref 6–20)
CO2: 23 mmol/L (ref 22–32)
Calcium: 8.4 mg/dL — ABNORMAL LOW (ref 8.9–10.3)
Chloride: 112 mmol/L — ABNORMAL HIGH (ref 98–111)
Creatinine, Ser: 0.95 mg/dL (ref 0.61–1.24)
GFR, Estimated: >60 mL/min (ref >60)
Glucose, Bld: 90 mg/dL (ref 70–99)
Potassium: 3.6 mmol/L (ref 3.5–5.1)
Sodium: 140 mmol/L (ref 135–145)
Total Bilirubin: 0.9 mg/dL (ref 0.3–1.2)
Total Protein: 5.9 g/dL — ABNORMAL LOW (ref 6.5–8.1)

## 2021-11-11 LAB — BASIC METABOLIC PANEL
Anion gap: 8 (ref 5–15)
BUN: 6 mg/dL (ref 6–20)
CO2: 19 mmol/L — ABNORMAL LOW (ref 22–32)
Calcium: 8.9 mg/dL (ref 8.9–10.3)
Chloride: 113 mmol/L — ABNORMAL HIGH (ref 98–111)
Creatinine, Ser: 0.79 mg/dL (ref 0.61–1.24)
GFR, Estimated: >60 mL/min (ref >60)
Glucose, Bld: 139 mg/dL — ABNORMAL HIGH (ref 70–99)
Potassium: 3.1 mmol/L — ABNORMAL LOW (ref 3.5–5.1)
Sodium: 140 mmol/L (ref 135–145)

## 2021-11-11 MED ORDER — POTASSIUM CHLORIDE CRYS ER 20 MEQ PO TBCR
40.0000 meq | EXTENDED_RELEASE_TABLET | Freq: Once | ORAL | Status: AC
  Administered 2021-11-11: 40 meq via ORAL
  Filled 2021-11-11: qty 2

## 2021-11-11 MED ORDER — POTASSIUM CHLORIDE CRYS ER 20 MEQ PO TBCR
40.0000 meq | EXTENDED_RELEASE_TABLET | Freq: Every day | ORAL | Status: DC
  Administered 2021-11-11 – 2021-11-12 (×2): 40 meq via ORAL
  Filled 2021-11-11 (×2): qty 2

## 2021-11-11 NOTE — Progress Notes (Signed)
Progress Note   Patient: Ronald Hoffman PIR:[id number] DOB: 1980/02/22 DOA: 11/08/2021     0 DOS: the patient was seen and examined on 11/11/2021   Brief hospital course: Ronald Hoffman is a 41 y.o. male with medical history significant of history of SVT, polysubstance abuse, schizophrenia, anxiety and delusional disorder.  Patient presented to the ED on 11/08/2021 after missing multiple days of his medications and using recreational drugs that had caused him to experience substance-induced psychotic disorder.  He was seen by TTS with recommendations for inpatient psych treatment.  While waiting for available facility he has experienced multiple vomiting events without episodes of coffee-ground emesis and positive occult blood test on vomiting content.   Case was discussed with gastroenterology service who recommended admission for PPI, fluid resuscitation, antiemetics and clear liquid diet with intention to slowly advance as tolerated.  If require endoscopy evaluation will be provided. [facility] has been contacted to place patient in the hospital for further evaluation and management.   Work-up demonstrating a stable hemoglobin level; with some signs of dehydration and AKI.  Assessment and Plan: * Nausea & vomiting - In the setting of withdrawal symptoms from recent drug use; versus presence of hyperemesis from the use of marijuana. -Continue the use of as needed antiemetics, continue PPI twice a day -So far tolerating diet. -No further episode of nausea or vomiting appreciated.  Substance-induced psychotic disorder (HCC) - Cessation counseling provided -Topamax and gabapentin recommended by psychiatry service -Given underlying uncontrolled psychosis disorder recommendation for inpatient therapy has been provided. -Currently no beds available -Will continue providing supportive care and follow recommendation by psychiatry service. -Medically stable for discharge to psych facility if available.  Drug  abuse (HCC) - Cessation counseling provided.  Hypokalemia -In the setting of GI losses -Continue to follow electrolytes trend and further replete as needed -Daily supplementation will be started.  Leukocytosis -Secondary to demargination -No acute source of infection appreciated -Patient has remained afebrile -WBCs after fluid resuscitation down to 11.4  AKI (acute kidney injury) (HCC) -In the setting of prerenal azotemia and dehydration -Improving/resolved after fluid resuscitation -Continue to follow renal function trend -Continue to maintain medical hydration -Continue minimizing the use of nephrotoxic agents.  SVT (supraventricular tachycardia) - Patient with prior history of SVT -Vital signs are stable currently -Continue treatment with metoprolol.  Coffee ground emesis - Patient experiencing couple episodes of vomiting on 11/08/2021 with a subsequent coffee-ground emesis on the date of admission (10/30/2021). -Most likely in the setting of Mallory-Weiss; gastritis induced by chronic NSAID use also possibility. -Appreciate GI service recommendation -No need for endoscopic evaluation currently if he continues to improve appropriately -Continue to avoid the use of NSAIDs -So far tolerating diet. -Hemoglobin has remained stable. -Continue PPI. -No overt bleeding appreciated.  Acute psychosis (HCC) - Patient with underlying history of schizophrenia -Per records was noncompliant with his medications prior to admission while acutely using recreational drugs. -Follow psychiatry service for further recommendations -Continue Zyprexa, Topamax and gabapentin. -Continue Ativan for anxiety.  Delusional disorder (HCC) - Most likely in the use by the use of recreational drugs (marijuana and cocaine) -Cessation counseling provided -Continue to follow recommendations by psychiatry service. -Plan is for inpatient psych facility placement.   Patient is medically stable and ready to  be transferred to psychiatric facility if available.  Subjective:  Per nursing report has been able to tolerate diet; no further nausea or vomiting.  Patient is afebrile.  Hemodynamically stable.  Physical Exam: Vitals:   11/10/21  1500 11/10/21 1940 11/11/21 0358 11/11/21 1405  BP: (!) 112/56 (!) 139/95 129/85 121/80  Pulse: 76 85 92 85  Resp: 16 18 20 16   Temp: 98.4 F (36.9 C) 98.5 F (36.9 C) 98.3 F (36.8 C) (!) 97.5 F (36.4 C)  TempSrc: Oral Oral Oral Oral  SpO2: 100% 100% 99% 100%  Weight:      Height:       General exam: Afebrile, no chest pain, good saturation on room air.  Continued to be delusional and having hallucinations.  No further nausea or vomiting. Respiratory system: Clear to auscultation. Respiratory effort normal.  No using accessory muscles. Cardiovascular system:RRR. No murmurs, rubs, gallops. Gastrointestinal system: Abdomen is nondistended, soft and nontender. No organomegaly or masses felt. Normal bowel sounds heard. Central nervous system: Alert and oriented. No focal neurological deficits. Extremities: No cyanosis or clubbing. Skin: No petechiae. Psychiatry: Judgement and insight impaired secondary to behavioral disturbances, underlying schizophrenia and ongoing delusional state.  Data Reviewed: CBC: WBCs 9.2, hemoglobin 12.3, platelet count 263 K. Basic metabolic panel: Sodium 242, potassium 3.1, chloride 113, bicarb 19, BUN 6, creatinine 0.79.  Family Communication: No family at bedside.  Disposition: Status is: inpatient.   Planned Discharge Destination: Pending placement at psychiatric facility.  Continues to be delusional and experiencing hallucinations.  Author: Barton Dubois, MD 11/11/2021 4:18 PM  For on call review [email or web address].

## 2021-11-11 NOTE — Progress Notes (Signed)
CSW requested that [place], RN review pt for [facility]. CSW will assist and follow.   Benjaman Kindler, MSW, [facility] 11/11/2021 10:58 PM

## 2021-11-11 NOTE — Plan of Care (Signed)
  Problem: Pain Managment: Goal: General experience of comfort will improve Outcome: Not Progressing   Problem: Education: Goal: Knowledge of General Education information will improve Description: Including pain rating scale, medication(s)/side effects and non-pharmacologic comfort measures Outcome: Progressing   Problem: Health Behavior/Discharge Planning: Goal: Ability to manage health-related needs will improve Outcome: Progressing   Problem: Clinical Measurements: Goal: Ability to maintain clinical measurements within normal limits will improve Outcome: Progressing Goal: Will remain free from infection Outcome: Progressing Goal: Diagnostic test results will improve Outcome: Progressing Goal: Respiratory complications will improve Outcome: Progressing Goal: Cardiovascular complication will be avoided Outcome: Progressing   Problem: Activity: Goal: Risk for activity intolerance will decrease Outcome: Progressing   Problem: Nutrition: Goal: Adequate nutrition will be maintained Outcome: Progressing   Problem: Coping: Goal: Level of anxiety will decrease Outcome: Progressing   Problem: Elimination: Goal: Will not experience complications related to bowel motility Outcome: Progressing Goal: Will not experience complications related to urinary retention Outcome: Progressing   Problem: Safety: Goal: Ability to remain free from injury will improve Outcome: Progressing   Problem: Skin Integrity: Goal: Risk for impaired skin integrity will decrease Outcome: Progressing   

## 2021-11-11 NOTE — Progress Notes (Signed)
Gastroenterology Progress Note   Referring Provider: No ref. provider found Primary Care Physician:  Pcp, No Primary Gastroenterologist: Dr. Marletta Lor (previously unassigned)  Patient ID: Ronald Hoffman; [id number]; September 22, 1980    Subjective   Reports some nausea but no vomiting. Also reports some intermittent abdominal pain and diarrhea however patient has been confused/delirious today. He states he is Ronald Hoffman and his father is Norval Morton.   Has sitter at bedside and awaiting a facility for inpatient psych care. No more observed vomiting by nursing staff.    Objective   Vital signs in last 24 hours Temp:  [98.3 F (36.8 C)-98.5 F (36.9 C)] 98.3 F (36.8 C) (10/02 0358) Pulse Rate:  [76-92] 92 (10/02 0358) Resp:  [16-20] 20 (10/02 0358) BP: (112-139)/(56-95) 129/85 (10/02 0358) SpO2:  [99 %-100 %] 99 % (10/02 0358) Last BM Date : 11/09/21  Physical Exam General:   Alert, pleasant Head:  Normocephalic and atraumatic. Eyes:  No icterus, sclera clear. Conjuctiva pink.  Mouth:  Without lesions, mucosa pink and moist.  Heart:  S1, S2 present, no murmurs noted.  Lungs: Clear to auscultation bilaterally, without wheezing, rales, or rhonchi.  Abdomen:  Bowel sounds present, soft, non-tender, non-distended. No HSM or hernias noted. No rebound or guarding. No masses appreciated  Msk:  Symmetrical without gross deformities. Normal posture. Neurologic:  Alert and disoriented x2-3; Skin:  Warm and dry, intact without significant lesions.  Psych:  Alert and cooperative.  Intake/Output from previous day: 10/01 0701 - 10/02 0700 In: 600 [P.O.:600] Out: -  Intake/Output this shift: No intake/output data recorded.  Lab Results  Recent Labs    11/09/21 1047 11/10/21 0515 11/11/21 0951  WBC 24.0* 11.6* 9.2  HGB 15.9 12.4* 12.3*  HCT 45.7 36.5* 35.8*  PLT 496* 315 263   BMET Recent Labs    11/09/21 1047 11/10/21 0515 11/11/21 0951  NA 145 140 140  K 3.4* 3.6 3.1*   CL 102 112* 113*  CO2 20* 23 19*  GLUCOSE 140* 90 139*  BUN 21* 14 6  CREATININE 1.46* 0.95 0.79  CALCIUM 10.7* 8.4* 8.9   LFT Recent Labs    11/08/21 1356 11/09/21 1047 11/10/21 0515  PROT 7.4 9.6* 5.9*  ALBUMIN 4.3 5.4* 3.7  AST 15 23 15   ALT 17 22 14   ALKPHOS 82 101 62  BILITOT 0.9 1.4* 0.9   PT/INR No results for input(s): "LABPROT", "INR" in the last 72 hours. Hepatitis Panel No results for input(s): "HEPBSAG", "HCVAB", "HEPAIGM", "HEPBIGM" in the last 72 hours.   Studies/Results CT ABDOMEN PELVIS W CONTRAST  Result Date: 11/09/2021 CLINICAL DATA:  Abdominal pain. EXAM: CT ABDOMEN AND PELVIS WITH CONTRAST TECHNIQUE: Multidetector CT imaging of the abdomen and pelvis was performed using the standard protocol following bolus administration of intravenous contrast. RADIATION DOSE REDUCTION: This exam was performed according to the departmental dose-optimization program which includes automated exposure control, adjustment of the mA and/or kV according to patient size and/or use of iterative reconstruction technique. CONTRAST:  OMNIPAQUE IOHEXOL 300 MG/ML  SOLN COMPARISON:  02/22/2021. FINDINGS: Lower chest: Clear lung bases. Hepatobiliary: No focal liver abnormality is seen. No gallstones, gallbladder wall thickening, or biliary dilatation. Pancreas: Unremarkable. No pancreatic ductal dilatation or surrounding inflammatory changes. Spleen: Normal in size without focal abnormality. Adrenals/Urinary Tract: Normal adrenal glands. Kidneys normal in size, orientation and position with symmetric enhancement. 8 mm, low-attenuation left midpole renal mass consistent with a cyst, stable. No follow-up recommended. No other renal masses,  no stones and no hydronephrosis. Normal ureters. Bladder decompressed, otherwise unremarkable. Stomach/Bowel: Stomach is within normal limits. Appendix appears normal. No evidence of bowel wall thickening, distention, or inflammatory changes.  Vascular/Lymphatic: No significant vascular findings are present. No enlarged abdominal or pelvic lymph nodes. Reproductive: Unremarkable. Other: No abdominal wall hernia or abnormality. No abdominopelvic ascites. Musculoskeletal: No acute or significant osseous findings. IMPRESSION: 1. No acute findings within the abdomen or pelvis. No findings to account for abdominal pain. Electronically Signed   By: Lajean Manes M.D.   On: 11/09/2021 12:17    Assessment  41 y.o. male with a history of polysubstance abuse, schizophrenia, anxiety, delusional disorder who presented to the ED after missing multiple days of his medications, recommend optional drug use with subsequent substance induced psychotic disorder.  He suffered multiple vomiting episodes with one episode of coffee-ground emesis that was heme positive in the ED while waiting inpatient psych treatment.  GI consulted for further evaluation and management.  Coffee-ground emesis, N/V:  History of polysubstance abuse including alcohol abuse and daily NSAID use for at least the last 6 months.  Began having nausea and vomiting in the ED and after about 2 episodes he had an episode of coffee-ground emesis that was heme positive.  He was started on IV PPI.  He had initial drop in his hemoglobin from 15.9-12.4 and has remained stable at 12.3.  This drop was likely dilutional given IV hydration.  He previously denied any melena or BRBPR.  No prior endoscopies on file.  Differentials include gastritis as well as possible Mallory-Weiss tear given frequent vomiting episodes.  History difficult to obtain given patient's psychological state.  No observed vomiting since admission.  Continue PPI twice daily and advance diet as tolerated, will trial soft diet today.     Plan / Recommendations  Supportive measures with antiemetics and IV fluids as needed Advance to soft diet Monitor for recurrence of vomiting and coffee-ground emesis Continue PPI twice daily while in  inpatient care. Avoid NSAIDs Alcohol cessation  Given patient overall is stable and no current alarm symptoms and hemoglobin stable, GI will sign off at this time.    LOS: 0 days    11/11/2021, 11:02 AM   Venetia Night, MSN, FNP-BC, AGACNP-BC [facility]

## 2021-11-11 NOTE — Consult Note (Signed)
Telepsych Consultation   Reason for Consult: Psych consult Referring Physician: Dr. Gwenlyn Perking Location of Patient: Ronald Hoffman, ED Location of Provider: [facility]  Patient Identification: Ronald Hoffman  MRN:  [id number]  Principal Diagnosis: Nausea & vomiting  Diagnosis:  Principal Problem:   Nausea & vomiting Active Problems:   Delusional disorder (HCC)   Acute psychosis (HCC)   Drug abuse (HCC)   Substance-induced psychotic disorder (HCC)   Coffee ground emesis   SVT (supraventricular tachycardia)   AKI (acute kidney injury) (HCC)   Leukocytosis   Hypokalemia  Total Time spent with patient: 1 hour  Subjective:   Ronald Hoffman is a 41 y.o. male patient.  HPI:  As Per Ronald Hoffman, ED initial intake notes: Ronald Hoffman is a 41 y.o. male with medical history significant of history of SVT, polysubstance abuse, schizophrenia, anxiety and delusional disorder.  Patient presented to the ED on 11/08/2021 after missing multiple days of his medications and using recreational drugs that had caused him to experience substance-induced psychotic disorder.  He was seen by TTS with recommendations for inpatient psych treatment.  While waiting for available facility he has experienced multiple vomiting events without episodes of coffee-ground emesis and positive occult blood test on vomiting content.   Case was discussed with gastroenterology service who recommended admission for PPI, fluid resuscitation, antiemetics and clear liquid diet with intention to slowly advance as tolerated.  If require endoscopy evaluation will be provided. [facility] has been contacted to place patient in the hospital for further evaluation and management.   Work-up demonstrating a stable hemoglobin level; with some signs of dehydration and AKI.   Assessment: Patient is seen and examined lying on the bed at [name], ED via telepsych.  Family members visiting at bedside, and patient gave permission for them to  stay during the exam.  Appears calm, although delusional, however, able to answer some questions. Chart reviewed and findings shared with the treatment team and consults with Dr. Lucianne Muss.  Alert and oriented x 3 but speech is illogical, delusional, and tangential.  When asked what brought patient to the hospital, patient reports that he had a bad dream and was yelling and was reaching out to heaven, his mother was scared and called the sheriff who took him to the hospital.  Also added, I am Ronald Hoffman and I am here to do some work, but I got stuck.  Presents with anxious and dysphoric mood and affect constricted.  Sensorium with memory fair, judgment impaired, and insight lacking.  Abnormal routine lab of 11/11/21 reviewed, CMP: Potassium 3.1 low, replaced with potassium chloride 40 mEq x 1 dose, chloride 113 high, CO2 19 low, glucose 139 high.  Increase p.o. fluid intake encouraged.  No vomiting reported today.  CBC: Hemoglobin 12.3 low, hematocrit 35.8 low.  UDS of 11/08/2021 positive for cocaine and marijuana.  Patient continues Zyprexa 10 mg p.o. daily at bedtime for psychotic symptoms.  Patient denies suicidal ideation, homicidal ideation, however added if anyone attempts to hurt me, I we will hurt them back.  With visual hallucination patient reports seeing images and people's faces.  With auditory hallucination, report, hearing voices over the air, as like telecommunication voices.  Reports having 0 hours of sleep last night, reports fair appetite and reported being safe at home. Patient denies access to firearms, denies self injurious behavior, denies history of drugs use, however UDS as reported above, denies alcohol drinking, denies being followed by a therapist/psychiatrist, and denies  family history of mental illness.  Patient reports smoking 1 pack of cigarette daily.  Instructions provided to patient on cessation of cigarette smoking as its adversely affects overall medical wellness.  Patient  nodded in agreement.  Disposition: Based on my assessment of patient today, he continues to require inpatient psychiatric placement.  [facility] emergency room treatment team and [facility] emergency room physician made aware of patient disposition.  CSW continues to seek adequate inpatient placement options for patient.  Past Psychiatric History:  Schizophrenia, polysubstance usage.  Risk to Self:  Yes Risk to Others:  Potential Prior Inpatient Therapy:  Yes  Prior Outpatient Therapy: Yes at [facility]  Past Medical History:  Past Medical History:  Diagnosis Date   Acute psychosis ([place]) 10/26/2019   Anxiety    Delusional disorder ([place]) 10/25/2019   Drug abuse ([place]) 03/19/2020   Hyperthyroidism    Lactic acidosis 02/21/2021   Seizures ([place])    stress related; no more seizures since first one 10 years ago; on no meds.   Sepsis ([place])    SIRS (systemic inflammatory response syndrome) ([place]) 02/20/2021   Substance-induced psychotic disorder ([place]) 03/19/2020   Tachycardia     Past Surgical History:  Procedure Laterality Date   HEMORRHOID SURGERY N/A 11/12/2015   Procedure: EXTENSIVE HEMORRHOIDECTOMY;  Surgeon: Aviva Signs, MD;  Location: [facility];  Service: General;  Laterality: N/A;   Family History: History reviewed. No pertinent family history.  Family Psychiatric  History: Patient denies  Social History:  Social History   Substance and Sexual Activity  Alcohol Use Yes   Comment: last drink 10/25/2019, pt said he had 3 beers     Social History   Substance and Sexual Activity  Drug Use Yes   Types: Heroin, Methamphetamines, Other-see comments   Comment: was using synthetic heroin daily for 2 years but stopped 2 months ago, methamphetamine daily for 3 months until June (last use 10/25/2019- "just a pinch"), "mushrooms yesterday," and "ketamine today"    Social History   Socioeconomic History   Marital status: Single    Spouse name: Not on file   Number of children: Not on file    Years of education: Not on file   Highest education level: Not on file  Occupational History   Not on file  Tobacco Use   Smoking status: Former    Packs/day: 0.50    Years: 10.00    Total pack years: 5.00    Types: Cigarettes   Smokeless tobacco: Never  Vaping Use   Vaping Use: Every day  Substance and Sexual Activity   Alcohol use: Yes    Comment: last drink 10/25/2019, pt said he had 3 beers   Drug use: Yes    Types: Heroin, Methamphetamines, Other-see comments    Comment: was using synthetic heroin daily for 2 years but stopped 2 months ago, methamphetamine daily for 3 months until June (last use 10/25/2019- "just a pinch"), "mushrooms yesterday," and "ketamine today"   Sexual activity: Never    Birth control/protection: None  Other Topics Concern   Not on file  Social History Narrative   Not on file   Social Determinants of Health   Financial Resource Strain: Not on file  Food Insecurity: Not on file  Transportation Needs: Not on file  Physical Activity: Not on file  Stress: Not on file  Social Connections: Not on file   Additional Social History:   Allergies:   Allergies  Allergen Reactions   Hydrocodone Itching  Penicillins Other (See Comments)    Unknown childhood allergy Has patient had a PCN reaction causing immediate rash, facial/tongue/throat swelling, SOB or lightheadedness with hypotension: unknown Has patient had a PCN reaction causing severe rash involving mucus membranes or skin necrosis: unknown Has patient had a PCN reaction that required hospitalization: unknown Has patient had a PCN reaction occurring within the last 10 years:no If all of the above answers are "NO", then may proceed with Cephalosporin use.     Labs:  Results for orders placed or performed during the hospital encounter of 11/08/21 (from the past 48 hour(s))  CK     Status: None   Collection Time: 11/09/21  6:16 PM  Result Value Ref Range   Total CK 80 49 - 397 U/L     Comment: Performed at [facility], [address], [place], [place] [address]  Magnesium     Status: None   Collection Time: 11/09/21  6:16 PM  Result Value Ref Range   Magnesium 2.3 1.7 - 2.4 mg/dL    Comment: Performed at [facility], [address] [place], [place] [address]  CBC     Status: Abnormal   Collection Time: 11/10/21  5:15 AM  Result Value Ref Range   WBC 11.6 (H) 4.0 - 10.5 K/uL   RBC 4.44 4.22 - 5.81 MIL/uL   Hemoglobin 12.4 (L) 13.0 - 17.0 g/dL   HCT 63.8 (L) 75.6 - 43.3 %   MCV 82.2 80.0 - 100.0 fL   MCH 27.9 26.0 - 34.0 pg   MCHC 34.0 30.0 - 36.0 g/dL   RDW 29.5 18.8 - 41.6 %   Platelets 315 150 - 400 K/uL   nRBC 0.0 0.0 - 0.2 %    Comment: Performed at [facility], [address] [place], [place] [address]  Comprehensive metabolic panel     Status: Abnormal   Collection Time: 11/10/21  5:15 AM  Result Value Ref Range   Sodium 140 135 - 145 mmol/L   Potassium 3.6 3.5 - 5.1 mmol/L   Chloride 112 (H) 98 - 111 mmol/L   CO2 23 22 - 32 mmol/L   Glucose, Bld 90 70 - 99 mg/dL    Comment: Glucose reference range applies only to samples taken after fasting for at least 8 hours.   BUN 14 6 - 20 mg/dL   Creatinine, Ser 1.60 0.61 - 1.24 mg/dL   Calcium 8.4 (L) 8.9 - 10.3 mg/dL   Total Protein 5.9 (L) 6.5 - 8.1 g/dL   Albumin 3.7 3.5 - 5.0 g/dL   AST 15 15 - 41 U/L   ALT 14 0 - 44 U/L   Alkaline Phosphatase 62 38 - 126 U/L   Total Bilirubin 0.9 0.3 - 1.2 mg/dL   GFR, Estimated >10 >93 mL/min    Comment: (NOTE) Calculated using the CKD-EPI Creatinine Equation (2021)    Anion gap 5 5 - 15    Comment: Performed at [facility], [address], [place], [place] [address]  CBC     Status: Abnormal   Collection Time: 11/11/21  9:51 AM  Result Value Ref Range   WBC 9.2 4.0 - 10.5 K/uL   RBC 4.42 4.22 - 5.81 MIL/uL   Hemoglobin 12.3 (L) 13.0 - 17.0 g/dL   HCT 32.2 (L) 02.5 - 42.7 %   MCV 81.0 80.0 - 100.0 fL   MCH 27.8 26.0 - 34.0 pg   MCHC 34.4 30.0 - 36.0  g/dL  RDW 13.3 11.5 - 15.5 %   Platelets 263 150 - 400 K/uL   nRBC 0.0 0.0 - 0.2 %    Comment: Performed at [facility], [address] [place], [place] [address]  Basic metabolic panel     Status: Abnormal   Collection Time: 11/11/21  9:51 AM  Result Value Ref Range   Sodium 140 135 - 145 mmol/L   Potassium 3.1 (L) 3.5 - 5.1 mmol/L   Chloride 113 (H) 98 - 111 mmol/L   CO2 19 (L) 22 - 32 mmol/L   Glucose, Bld 139 (H) 70 - 99 mg/dL    Comment: Glucose reference range applies only to samples taken after fasting for at least 8 hours.   BUN 6 6 - 20 mg/dL   Creatinine, Ser 2.59 0.61 - 1.24 mg/dL   Calcium 8.9 8.9 - 56.3 mg/dL   GFR, Estimated >87 >56 mL/min    Comment: (NOTE) Calculated using the CKD-EPI Creatinine Equation (2021)    Anion gap 8 5 - 15    Comment: Performed at [facility], [address], [place], [place] [address]    Medications:  Current Facility-Administered Medications  Medication Dose Route Frequency Provider Last Rate Last Admin   0.9 %  sodium chloride infusion   Intravenous Continuous Vassie Loll, MD 100 mL/hr at 11/11/21 1202 New Bag at 11/11/21 1202   acetaminophen (TYLENOL) tablet 650 mg  650 mg Oral Q6H PRN Vassie Loll, MD       Or   acetaminophen (TYLENOL) suppository 650 mg  650 mg Rectal Q6H PRN Vassie Loll, MD       folic acid (FOLVITE) tablet 1 mg  1 mg Oral Daily Vassie Loll, MD   1 mg at 11/11/21 0930   gabapentin (NEURONTIN) capsule 400 mg  400 mg Oral TID Bethann Berkshire, MD   400 mg at 11/11/21 0930   LORazepam (ATIVAN) injection 0-4 mg  0-4 mg Intravenous Q6H Vassie Loll, MD   2 mg at 11/11/21 0930   Followed by   Melene Muller ON 11/12/2021] LORazepam (ATIVAN) injection 0-4 mg  0-4 mg Intravenous Q12H Vassie Loll, MD       LORazepam (ATIVAN) tablet 1-4 mg  1-4 mg Oral Q1H PRN Vassie Loll, MD   2 mg at 11/11/21 0445   Or   LORazepam (ATIVAN) injection 1-4 mg  1-4 mg Intravenous Q1H PRN Vassie Loll, MD   2 mg at 11/10/21 1835    menthol-cetylpyridinium (CEPACOL) lozenge 3 mg  1 lozenge Oral PRN Bethann Berkshire, MD   3 mg at 11/09/21 1321   metoprolol tartrate (LOPRESSOR) tablet 50 mg  50 mg Oral BID Bethann Berkshire, MD   50 mg at 11/11/21 5188   multivitamin with minerals tablet 1 tablet  1 tablet Oral Daily Vassie Loll, MD   1 tablet at 11/11/21 4166   nicotine (NICODERM CQ - dosed in mg/24 hours) patch 21 mg  21 mg Transdermal Daily Zierle-Ghosh, Asia B, DO   21 mg at 11/11/21 0930   OLANZapine (ZYPREXA) tablet 10 mg  10 mg Oral Marvis Moeller, MD   10 mg at 11/10/21 2127   oxyCODONE (Oxy IR/ROXICODONE) immediate release tablet 5 mg  5 mg Oral Q8H PRN Vassie Loll, MD   5 mg at 11/11/21 1157   pantoprazole (PROTONIX) EC tablet 40 mg  40 mg Oral BID Vassie Loll, MD   40 mg at 11/11/21 0930   prochlorperazine (COMPAZINE) injection 10 mg  10 mg Intravenous  Q6H PRN Vassie Loll, MD       thiamine (VITAMIN B1) tablet 100 mg  100 mg Oral Daily Vassie Loll, MD   100 mg at 11/11/21 0930   Or   thiamine (VITAMIN B1) injection 100 mg  100 mg Intravenous Daily Vassie Loll, MD       topiramate (TOPAMAX) tablet 50 mg  50 mg Oral Daily Ophelia Shoulder E, NP   50 mg at 11/11/21 7829    Musculoskeletal: Strength & Muscle Tone: within normal limits Gait & Station: normal Patient leans: N/A  Psychiatric Specialty Exam:  Presentation  General Appearance:  Appropriate for Environment; Casual; Fairly Groomed  Eye Contact: Fair  Speech: Clear and Coherent; Normal Rate  Speech Volume: Normal  Handedness: Right  Mood and Affect  Mood: Anxious; Dysphoric  Affect: Congruent; Constricted  Thought Process  Thought Processes: Coherent; Disorganized  Descriptions of Associations:Loose  Orientation:Partial  Thought Content:Illogical; Scattered  History of Schizophrenia/Schizoaffective disorder:Yes  Duration of Psychotic Symptoms:Greater than six months  Hallucinations:Hallucinations: Auditory;  Visual Description of Auditory Hallucinations: Hearing a bunch of voices Description of Visual Hallucinations: Seeing a ship in the orbit sitting and waiting on making.  Ideas of Reference:Delusions  Suicidal Thoughts:Suicidal Thoughts: No  Homicidal Thoughts:Homicidal Thoughts: No  Sensorium  Memory: Immediate Fair  Judgment: Impaired  Insight: Lacking  Executive Functions  Concentration: Fair  Attention Span: Fair  Recall: Poor  Fund of Knowledge: Fair  Language: Fair  Psychomotor Activity  Psychomotor Activity: Psychomotor Activity: Increased  Assets  Assets: Communication Skills; Physical Health; Social Support  Sleep  Sleep: Sleep: Poor Number of Hours of Sleep: 0  Physical Exam: Physical Exam Vitals and nursing note reviewed.  Constitutional:      Appearance: Normal appearance.  HENT:     Head: Normocephalic and atraumatic.     Right Ear: External ear normal.     Left Ear: External ear normal.     Nose: Nose normal.     Mouth/Throat:     Mouth: Mucous membranes are moist.  Eyes:     Extraocular Movements: Extraocular movements intact.     Conjunctiva/sclera: Conjunctivae normal.     Pupils: Pupils are equal, round, and reactive to light.  Cardiovascular:     Rate and Rhythm: Normal rate.     Pulses: Normal pulses.  Pulmonary:     Effort: Pulmonary effort is normal.  Genitourinary:    Comments: Deferred Musculoskeletal:        General: Normal range of motion.     Cervical back: Normal range of motion.  Skin:    General: Skin is warm.  Neurological:     General: No focal deficit present.     Mental Status: He is alert and oriented to person, place, and time.  Psychiatric:        Mood and Affect: Mood normal.        Behavior: Behavior normal.   Review of Systems  Constitutional: Negative.  Negative for chills and fever.  HENT: Negative.  Negative for hearing loss and tinnitus.   Eyes: Negative.  Negative for blurred vision and  double vision.  Respiratory: Negative.  Negative for cough, sputum production, shortness of breath and wheezing.   Cardiovascular: Negative.  Negative for chest pain and palpitations.  Gastrointestinal: Negative.  Negative for heartburn, nausea and vomiting.  Genitourinary: Negative.  Negative for dysuria and urgency.  Musculoskeletal: Negative.  Negative for myalgias and neck pain.  Skin: Negative.  Negative for itching and rash.  Neurological:  Positive for seizures (History of seizures). Negative for dizziness, tingling and headaches.  Endo/Heme/Allergies: Negative.  Negative for environmental allergies and polydipsia. Does not bruise/bleed easily.  Psychiatric/Behavioral:  Positive for substance abuse. The patient has insomnia.    Blood pressure 129/85, pulse 92, temperature 98.3 F (36.8 C), temperature source Oral, resp. rate 20, height 5\' 5"  (1.651 m), weight 62.4 kg, SpO2 99 %. Body mass index is 22.88 kg/m.  Treatment Plan Summary: Daily contact with patient to assess and evaluate symptoms and progress in treatment and Medication management  Disposition: Recommend psychiatric Inpatient admission when medically cleared.  This service was provided via telemedicine using a 2-way, interactive audio and video technology.  Names of all persons participating in this telemedicine service and their role in this encounter. Name: Salena Sanerodd Loeza Role: Patient  Name: Alan Mulderina Harmony Sandell, NP Role: Provider  Name: Dr. Lucianne MussKumar Role: Medical Director  Name: Dr. Gwenlyn PerkingMadera Role: Ronald HawkingAnnie Penn, ED physician    Cecilie Lowersina C Gawain Crombie, FNP 11/11/2021 1:05 PM

## 2021-11-12 DIAGNOSIS — I471 Supraventricular tachycardia, unspecified: Secondary | ICD-10-CM

## 2021-11-12 LAB — CBC WITH DIFFERENTIAL/PLATELET
Abs Immature Granulocytes: 0.01 10*3/uL (ref 0.00–0.07)
Basophils Absolute: 0 10*3/uL (ref 0.0–0.1)
Basophils Relative: 0 %
Eosinophils Absolute: 0.1 10*3/uL (ref 0.0–0.5)
Eosinophils Relative: 1 %
HCT: 37 % — ABNORMAL LOW (ref 39.0–52.0)
Hemoglobin: 13.1 g/dL (ref 13.0–17.0)
Immature Granulocytes: 0 %
Lymphocytes Relative: 17 %
Lymphs Abs: 1.4 10*3/uL (ref 0.7–4.0)
MCH: 28.5 pg (ref 26.0–34.0)
MCHC: 35.4 g/dL (ref 30.0–36.0)
MCV: 80.4 fL (ref 80.0–100.0)
Monocytes Absolute: 0.5 10*3/uL (ref 0.1–1.0)
Monocytes Relative: 6 %
Neutro Abs: 6.4 10*3/uL (ref 1.7–7.7)
Neutrophils Relative %: 76 %
Platelets: 298 10*3/uL (ref 150–400)
RBC: 4.6 MIL/uL (ref 4.22–5.81)
RDW: 13.6 % (ref 11.5–15.5)
WBC: 8.5 10*3/uL (ref 4.0–10.5)
nRBC: 0 % (ref 0.0–0.2)

## 2021-11-12 LAB — SARS CORONAVIRUS 2 BY RT PCR: SARS Coronavirus 2 by RT PCR: NEGATIVE

## 2021-11-12 MED ORDER — NICOTINE 21 MG/24HR TD PT24: 21.0000 mg | MEDICATED_PATCH | Freq: Every day | TRANSDERMAL | 0 refills | Status: DC

## 2021-11-12 MED ORDER — METOPROLOL TARTRATE 50 MG PO TABS: 50.0000 mg | ORAL_TABLET | Freq: Two times a day (BID) | ORAL | 2 refills | Status: DC

## 2021-11-12 MED ORDER — POTASSIUM CHLORIDE CRYS ER 20 MEQ PO TBCR: 40.0000 meq | EXTENDED_RELEASE_TABLET | Freq: Every day | ORAL | 0 refills | Status: DC

## 2021-11-12 MED ORDER — FOLIC ACID 1 MG PO TABS: 1.0000 mg | ORAL_TABLET | Freq: Every day | ORAL | 1 refills | Status: DC

## 2021-11-12 MED ORDER — ACETAMINOPHEN 325 MG PO TABS: 650.0000 mg | ORAL_TABLET | Freq: Four times a day (QID) | ORAL | Status: DC | PRN

## 2021-11-12 MED ORDER — OXYCODONE HCL 5 MG PO TABS: 5.0000 mg | ORAL_TABLET | Freq: Four times a day (QID) | ORAL | 0 refills | Status: DC | PRN

## 2021-11-12 MED ORDER — PANTOPRAZOLE SODIUM 40 MG PO TBEC: 40.0000 mg | DELAYED_RELEASE_TABLET | Freq: Two times a day (BID) | ORAL | 2 refills | Status: DC

## 2021-11-12 MED ORDER — LORAZEPAM 1 MG PO TABS: 1.0000 mg | ORAL_TABLET | Freq: Four times a day (QID) | ORAL | 0 refills | Status: DC | PRN

## 2021-11-12 MED ORDER — OLANZAPINE 10 MG PO TABS: 10.0000 mg | ORAL_TABLET | Freq: Every day | ORAL | 1 refills | Status: DC

## 2021-11-12 MED ORDER — ORAL CARE MOUTH RINSE: 15.0000 mL | OROMUCOSAL | Status: DC | PRN

## 2021-11-12 MED ORDER — TOPIRAMATE 50 MG PO TABS: 50.0000 mg | ORAL_TABLET | Freq: Every day | ORAL | 1 refills | Status: DC

## 2021-11-12 MED ORDER — ADULT MULTIVITAMIN W/MINERALS CH: 1.0000 | ORAL_TABLET | Freq: Every day | ORAL | 1 refills | Status: DC

## 2021-11-12 MED ORDER — GABAPENTIN 400 MG PO CAPS: 400.0000 mg | ORAL_CAPSULE | Freq: Three times a day (TID) | ORAL | 1 refills | Status: DC

## 2021-11-12 MED ORDER — VITAMIN B-1 100 MG PO TABS: 100.0000 mg | ORAL_TABLET | Freq: Every day | ORAL | 1 refills | Status: DC

## 2021-11-12 NOTE — BH Assessment (Signed)
Patient is to be admitted to [facility] BMU tonight 11/12/21 after 7:30pm. by Dr. Weber Cooks.  Attending Physician will be Dr.  Weber Cooks .   Patient has been assigned to room 319, by [place], Junita Push.    ** Call for report: [phone] **  ER staff is aware of the admission:  Seth Bake, Patient Access.

## 2021-11-12 NOTE — Progress Notes (Signed)
Pt was accept to [facility] 11/12/21; Bed Assignment 319   Pt meets inpatient criteria per Merlyn Lot, NP,   Attending Physician will be Alethia Berthold, MD  Report can be called to: [phone]  Pt can arrive after 7:30pm  Care Team notified: Marla Roe, RN, Nicoletta Ba, Counselor, Melony Overly, RN, Merlene Morse, RN, Merlyn Lot, NP, Alethia Berthold, MD, Marylee Floras, RN  Nadara Mode, [place] 11/12/2021 @ 2:33 PM

## 2021-11-12 NOTE — Discharge Summary (Signed)
Physician Discharge Summary   Patient: Ronald Hoffman MRN: [id number] DOB: [birth year]  Admit date:     11/08/2021  Discharge date: 11/12/21  Discharge Physician: Vassie Loll   PCP: Pcp, No   Recommendations at discharge:  Repeat basic metabolic panel in 2 weeks to follow electrolytes and renal function Repeat CBC in 2 weeks to follow hemoglobin trend and stability.  Discharge Diagnoses: Principal Problem:   Nausea & vomiting Active Problems:   Drug abuse (HCC)   Substance-induced psychotic disorder (HCC)   Delusional disorder (HCC)   Acute psychosis (HCC)   Coffee ground emesis   SVT (supraventricular tachycardia)   AKI (acute kidney injury) (HCC)   Leukocytosis   Hypokalemia   Delusional disorder, erotomanic type, first episode, currently in acute episode [facility])  Hospital Course: [name] is a 41 y.o. male with medical history significant of history of SVT, polysubstance abuse, schizophrenia, anxiety and delusional disorder.  Patient presented to the ED on 11/08/2021 after missing multiple days of his medications and using recreational drugs that had caused him to experience substance-induced psychotic disorder.  He was seen by TTS with recommendations for inpatient psych treatment.  While waiting for available facility he has experienced multiple vomiting events without episodes of coffee-ground emesis and positive occult blood test on vomiting content.   Case was discussed with gastroenterology service who recommended admission for PPI, fluid resuscitation, antiemetics and clear liquid diet with intention to slowly advance as tolerated.  If require endoscopy evaluation will be provided. TRH has been contacted to place patient in the hospital for further evaluation and management.   Work-up demonstrating a stable hemoglobin level; with some signs of dehydration and AKI.  Assessment and Plan: * Nausea & vomiting -In the setting of withdrawal symptoms from recent drug use;  versus presence of hyperemesis from the use of marijuana. -Diet fully advanced and well-tolerated prior to discharge. -Continue the use of PPI twice a day. -Maintain adequate hydration.  Substance-induced psychotic disorder (HCC) -Cessation counseling provided -Topamax and gabapentin recommended by psychiatry service -Given underlying uncontrolled psychosis disorder recommendation for inpatient therapy has been provided. -Accepted to psychiatry facility for further evaluation and management. -Patient is medically stable. -Stable hemoglobin level and negative COVID test appreciated at time of discharge.  Drug abuse (HCC) -Cessation counseling provided. -will recommend to receive Outpatient resources to be provided at time of discharge.  Delusional disorder, erotomanic type, first episode, currently in acute episode [facility]) - Further treatment as per psychiatry service.  Hypokalemia -In the setting of GI losses -Repleted and discharge on daily supplementation. -Continue to maintain adequate hydration -Follow basic metabolic panel in 2 weeks to reassess stability and trend.   Leukocytosis -Without frank source of infection currently appreciated. -Most likely demargination -After fluid resuscitation patient's WBCs back to normal range; 8.5 at discharge. -Patient has remained afebrile and not demonstrating signs of acute infection.  AKI (acute kidney injury) (HCC) -In the setting of prerenal azotemia -Renal function within normal limits at discharge -Continue to maintain adequate hydration -Repeat basic metabolic panel in 2 weeks to follow electrolytes and renal function and stability.   SVT (supraventricular tachycardia) -Patient with prior history of SVT -Vital signs are stable currently -Continue treatment with metoprolol -Maintain adequate hydration.  Coffee ground emesis - Patient experiencing couple episodes of vomiting on 11/08/2021 with a subsequent coffee-ground emesis  on the date of admission (10/30/2021). -Occult blood positive test on vomiting content. -Overall symptoms improved/resolved. -No further nausea, vomiting or hematochezia prior to  discharge. -GI service consulted recommended to continue adequate hydration, supportive care and the use of PPI twice a day. -CBC has remained stable and hemoglobin 13.1 at time of discharge. -Most likely Mallory-Weiss. -Follow clinical response.  Acute psychosis (HCC) -Patient with underlying history of schizophrenia -Per records was noncompliant with his medications prior to admission while acutely using recreational drugs. -Follow psychiatry service for further recommendations -Continue Zyprexa, Topamax and gabapentin. -As needed Ativan has also been recommended for anxiety and insomnia..  Delusional disorder (HCC) -Most likely in the use by the use of recreational drugs (marijuana and cocaine) -Cessation counseling provided -Continue to follow recommendations by psychiatry service.   Consultants: GI service, psychiatry service. Procedures performed: See below for x-ray reports. Disposition: Psychiatry facility. Diet recommendation: Regular diet.  DISCHARGE MEDICATION: Allergies as of 11/12/2021       Reactions   Hydrocodone Itching   Penicillins Other (See Comments)   Unknown childhood allergy Has patient had a PCN reaction causing immediate rash, facial/tongue/throat swelling, SOB or lightheadedness with hypotension: unknown Has patient had a PCN reaction causing severe rash involving mucus membranes or skin necrosis: unknown Has patient had a PCN reaction that required hospitalization: unknown Has patient had a PCN reaction occurring within the last 10 years:no If all of the above answers are "NO", then may proceed with Cephalosporin use.        Medication List     TAKE these medications    acetaminophen 325 MG tablet Commonly known as: TYLENOL Take 2 tablets (650 mg total) by mouth  every 6 (six) hours as needed for mild pain (or Fever >/= 101).   folic acid 1 MG tablet Commonly known as: FOLVITE Take 1 tablet (1 mg total) by mouth daily. Start taking on: November 13, 2021   gabapentin 400 MG capsule Commonly known as: NEURONTIN Take 1 capsule (400 mg total) by mouth 3 (three) times daily.   LORazepam 1 MG tablet Commonly known as: Ativan Take 1 tablet (1 mg total) by mouth every 6 (six) hours as needed for anxiety or sleep.   metoprolol tartrate 50 MG tablet Commonly known as: LOPRESSOR Take 1 tablet (50 mg total) by mouth 2 (two) times daily.   multivitamin with minerals Tabs tablet Take 1 tablet by mouth daily. Start taking on: November 13, 2021   nicotine 21 mg/24hr patch Commonly known as: NICODERM CQ - dosed in mg/24 hours Place 1 patch (21 mg total) onto the skin daily. Start taking on: November 13, 2021   OLANZapine 10 MG tablet Commonly known as: ZYPREXA Take 1 tablet (10 mg total) by mouth at bedtime.   oxyCODONE 5 MG immediate release tablet Commonly known as: Oxy IR/ROXICODONE Take 1 tablet (5 mg total) by mouth every 6 (six) hours as needed for severe pain.   pantoprazole 40 MG tablet Commonly known as: PROTONIX Take 1 tablet (40 mg total) by mouth 2 (two) times daily.   potassium chloride SA 20 MEQ tablet Commonly known as: KLOR-CON M Take 2 tablets (40 mEq total) by mouth daily. Start taking on: November 13, 2021   thiamine 100 MG tablet Commonly known as: Vitamin B-1 Take 1 tablet (100 mg total) by mouth daily. Start taking on: November 13, 2021   topiramate 50 MG tablet Commonly known as: TOPAMAX Take 1 tablet (50 mg total) by mouth daily. Start taking on: November 13, 2021        Discharge Exam: Ceasar Mons Weights   11/09/21 1649  Weight: 62.4 kg  General exam: Alert, awake, oriented x 1; still delusional and per nursing reports still experiencing intermittent episodes of hallucinations. Respiratory system: Clear to  auscultation. Respiratory effort normal.  Good duration of room air. Cardiovascular system:RRR. No murmurs, rubs, gallops. Gastrointestinal system: Abdomen is nondistended, soft and nontender. No organomegaly or masses felt. Normal bowel sounds heard. Central nervous system: No focal neurological deficits. Extremities: No cyanosis or clubbing. Skin: No petechiae. Psychiatry: Calm and following commands appropriately.  Continues to demonstrate delusional behavior and disorientation.  Condition at discharge: Stable.  The results of significant diagnostics from this hospitalization (including imaging, microbiology, ancillary and laboratory) are listed below for reference.   Imaging Studies: CT ABDOMEN PELVIS W CONTRAST  Result Date: 11/09/2021 CLINICAL DATA:  Abdominal pain. EXAM: CT ABDOMEN AND PELVIS WITH CONTRAST TECHNIQUE: Multidetector CT imaging of the abdomen and pelvis was performed using the standard protocol following bolus administration of intravenous contrast. RADIATION DOSE REDUCTION: This exam was performed according to the departmental dose-optimization program which includes automated exposure control, adjustment of the mA and/or kV according to patient size and/or use of iterative reconstruction technique. CONTRAST:  100mL OMNIPAQUE IOHEXOL 300 MG/ML  SOLN COMPARISON:  02/22/2021. FINDINGS: Lower chest: Clear lung bases. Hepatobiliary: No focal liver abnormality is seen. No gallstones, gallbladder wall thickening, or biliary dilatation. Pancreas: Unremarkable. No pancreatic ductal dilatation or surrounding inflammatory changes. Spleen: Normal in size without focal abnormality. Adrenals/Urinary Tract: Normal adrenal glands. Kidneys normal in size, orientation and position with symmetric enhancement. 8 mm, low-attenuation left midpole renal mass consistent with a cyst, stable. No follow-up recommended. No other renal masses, no stones and no hydronephrosis. Normal ureters. Bladder  decompressed, otherwise unremarkable. Stomach/Bowel: Stomach is within normal limits. Appendix appears normal. No evidence of bowel wall thickening, distention, or inflammatory changes. Vascular/Lymphatic: No significant vascular findings are present. No enlarged abdominal or pelvic lymph nodes. Reproductive: Unremarkable. Other: No abdominal wall hernia or abnormality. No abdominopelvic ascites. Musculoskeletal: No acute or significant osseous findings. IMPRESSION: 1. No acute findings within the abdomen or pelvis. No findings to account for abdominal pain. Electronically Signed   By: Amie Portlandavid  Ormond M.D.   On: 11/09/2021 12:17    Microbiology: Results for orders placed or performed during the hospital encounter of 11/08/21  Resp Panel by RT-PCR (Flu A&B, Covid) Anterior Nasal Swab     Status: None   Collection Time: 11/09/21 10:31 AM   Specimen: Anterior Nasal Swab  Result Value Ref Range Status   SARS Coronavirus 2 by RT PCR NEGATIVE NEGATIVE Final    Comment: (NOTE) SARS-CoV-2 target nucleic acids are NOT DETECTED.  The SARS-CoV-2 RNA is generally detectable in upper respiratory specimens during the acute phase of infection. The lowest concentration of SARS-CoV-2 viral copies this assay can detect is 138 copies/mL. A negative result does not preclude SARS-Cov-2 infection and should not be used as the sole basis for treatment or other patient management decisions. A negative result may occur with  improper specimen collection/handling, submission of specimen other than nasopharyngeal swab, presence of viral mutation(s) within the areas targeted by this assay, and inadequate number of viral copies(<138 copies/mL). A negative result must be combined with clinical observations, patient history, and epidemiological information. The expected result is Negative.  Fact Sheet for Patients:  BloggerCourse.comhttps://www.fda.gov/media/152166/download  Fact Sheet for Healthcare Providers:   SeriousBroker.ithttps://www.fda.gov/media/152162/download  This test is no t yet approved or cleared by the [place] FDA and  has been authorized for detection and/or diagnosis of SARS-CoV-2 by FDA under an  Emergency Use Authorization (EUA). This EUA will remain  in effect (meaning this test can be used) for the duration of the COVID-19 declaration under Section 564(b)(1) of the Act, 21 U.S.C.section 360bbb-3(b)(1), unless the authorization is terminated  or revoked sooner.       Influenza A by PCR NEGATIVE NEGATIVE Final   Influenza B by PCR NEGATIVE NEGATIVE Final    Comment: (NOTE) The Xpert Xpress SARS-CoV-2/FLU/RSV plus assay is intended as an aid in the diagnosis of influenza from Nasopharyngeal swab specimens and should not be used as a sole basis for treatment. Nasal washings and aspirates are unacceptable for Xpert Xpress SARS-CoV-2/FLU/RSV testing.  Fact Sheet for Patients: EntrepreneurPulse.com.au  Fact Sheet for Healthcare Providers: IncredibleEmployment.be  This test is not yet approved or cleared by the [place] FDA and has been authorized for detection and/or diagnosis of SARS-CoV-2 by FDA under an Emergency Use Authorization (EUA). This EUA will remain in effect (meaning this test can be used) for the duration of the COVID-19 declaration under Section 564(b)(1) of the Act, 21 U.S.C. section 360bbb-3(b)(1), unless the authorization is terminated or revoked.  Performed at [facility], [address] [place], [place] [address]   SARS Coronavirus 2 by RT PCR (hospital order, performed in [facility] hospital lab) *cepheid single result test*     Status: None   Collection Time: 11/12/21 12:45 PM  Result Value Ref Range Status   SARS Coronavirus 2 by RT PCR NEGATIVE NEGATIVE Final    Comment: (NOTE) SARS-CoV-2 target nucleic acids are NOT DETECTED.  The SARS-CoV-2 RNA is generally detectable in upper and lower respiratory  specimens during the acute phase of infection. The lowest concentration of SARS-CoV-2 viral copies this assay can detect is 250 copies / mL. A negative result does not preclude SARS-CoV-2 infection and should not be used as the sole basis for treatment or other patient management decisions.  A negative result may occur with improper specimen collection / handling, submission of specimen other than nasopharyngeal swab, presence of viral mutation(s) within the areas targeted by this assay, and inadequate number of viral copies (<250 copies / mL). A negative result must be combined with clinical observations, patient history, and epidemiological information.  Fact Sheet for Patients:   https://www.patel.info/  Fact Sheet for Healthcare Providers: https://hall.com/  This test is not yet approved or  cleared by the [place] FDA and has been authorized for detection and/or diagnosis of SARS-CoV-2 by FDA under an Emergency Use Authorization (EUA).  This EUA will remain in effect (meaning this test can be used) for the duration of the COVID-19 declaration under Section 564(b)(1) of the Act, 21 U.S.C. section 360bbb-3(b)(1), unless the authorization is terminated or revoked sooner.  Performed at [facility], [address], [place], [place] [address]     Labs: CBC: Recent Labs  Lab 11/08/21 1356 11/09/21 1047 11/10/21 0515 11/11/21 0951 11/12/21 1150  WBC 9.3 24.0* 11.6* 9.2 8.5  NEUTROABS 7.4 21.2*  --   --  6.4  HGB 14.1 15.9 12.4* 12.3* 13.1  HCT 40.7 45.7 36.5* 35.8* 37.0*  MCV 80.6 81.0 82.2 81.0 80.4  PLT 300 496* 315 263 098   Basic Metabolic Panel: Recent Labs  Lab 11/08/21 1356 11/09/21 1047 11/09/21 1816 11/10/21 0515 11/11/21 0951  NA 140 145  --  140 140  K 3.3* 3.4*  --  3.6 3.1*  CL 106 102  --  112* 113*  CO2 25 20*  --  23 19*  GLUCOSE 102* 140*  --  90 139*  BUN 14 21*  --  14 6  CREATININE 0.93 1.46*   --  0.95 0.79  CALCIUM 9.1 10.7*  --  8.4* 8.9  MG  --   --  2.3  --   --    Liver Function Tests: Recent Labs  Lab 11/08/21 1356 11/09/21 1047 11/10/21 0515  AST 15 23 15   ALT 17 22 14   ALKPHOS 82 101 62  BILITOT 0.9 1.4* 0.9  PROT 7.4 9.6* 5.9*  ALBUMIN 4.3 5.4* 3.7   CBG: No results for input(s): "GLUCAP" in the last 168 hours.  Discharge time spent: greater than 30 minutes.  Signed: , MD [facility] 11/12/2021

## 2021-11-12 NOTE — Assessment & Plan Note (Signed)
-   Further treatment as per psychiatry service.

## 2021-11-12 NOTE — Progress Notes (Signed)
Inpatient Behavioral Health Placement  Pt meets inpatient criteria per Merlyn Lot, NP.  There are no available beds at [facility]. Pt is under review at [facility]. Referral was sent to the following facilities;    Destination Service Provider Address Phone Fax  [facility]  [address], [place] [address] [phone]  [place] [place] [address] [phone]  [facility]  [address], [place] [address] [phone]  [facility]  [place], [place] [address] [phone]  [facility]  [address] [place], [place] [address] [phone] [place] [place] [address] [place]  [facility]  [address] [place] [address] [phone]  [facility]  [address], [place] [address] [phone]  [facility]  [address] [place] [address] [phone] [place], [place] [address] [phone] [facility]  [address], [place] [address] [phone]  [facility]  [address] [place] [address] [phone] [facility]  [address] [place] [address] [phone]  [facility]  [address] [place] [address] [phone]  [facility]  [place], [place] [address] [place]  [facility] [address] [place] [address] [phone]  [facility]  [place] [address] [place] [address]  [phone]  [facility]  [address] [place] [address] [phone]   Situation ongoing,  CSW will follow up.   Benjaman Kindler, MSW, LCSWA 11/12/2021  @ 11:58 AM

## 2021-11-13 ENCOUNTER — Other Ambulatory Visit: Payer: Self-pay

## 2021-11-13 ENCOUNTER — Encounter: Payer: Self-pay | Admitting: Psychiatry

## 2021-11-13 ENCOUNTER — Inpatient Hospital Stay
Admission: AD | Admit: 2021-11-13 | Discharge: 2021-11-20 | DRG: 885 | Disposition: A | Discharge status: Home / Self Care | Payer: 59 | Source: Intra-hospital | Attending: Psychiatry | Admitting: Psychiatry

## 2021-11-13 DIAGNOSIS — Z880 Allergy status to penicillin: Secondary | ICD-10-CM

## 2021-11-13 DIAGNOSIS — F200 Paranoid schizophrenia: Principal | ICD-10-CM | POA: Diagnosis present

## 2021-11-13 DIAGNOSIS — Z20822 Contact with and (suspected) exposure to covid-19: Secondary | ICD-10-CM | POA: Diagnosis present

## 2021-11-13 DIAGNOSIS — F209 Schizophrenia, unspecified: Secondary | ICD-10-CM | POA: Diagnosis present

## 2021-11-13 DIAGNOSIS — Z87891 Personal history of nicotine dependence: Secondary | ICD-10-CM

## 2021-11-13 DIAGNOSIS — F1410 Cocaine abuse, uncomplicated: Secondary | ICD-10-CM

## 2021-11-13 DIAGNOSIS — F1010 Alcohol abuse, uncomplicated: Secondary | ICD-10-CM | POA: Diagnosis present

## 2021-11-13 DIAGNOSIS — R45851 Suicidal ideations: Secondary | ICD-10-CM | POA: Diagnosis present

## 2021-11-13 DIAGNOSIS — F19959 Other psychoactive substance use, unspecified with psychoactive substance-induced psychotic disorder, unspecified: Secondary | ICD-10-CM | POA: Diagnosis present

## 2021-11-13 DIAGNOSIS — F203 Undifferentiated schizophrenia: Secondary | ICD-10-CM | POA: Diagnosis not present

## 2021-11-13 MED ORDER — THIAMINE HCL 100 MG/ML IJ SOLN
100.0000 mg | Freq: Every day | INTRAMUSCULAR | Status: DC
  Filled 2021-11-13 (×5): qty 1

## 2021-11-13 MED ORDER — GABAPENTIN 300 MG PO CAPS
300.0000 mg | ORAL_CAPSULE | Freq: Three times a day (TID) | ORAL | Status: DC
  Administered 2021-11-13 – 2021-11-18 (×15): 300 mg via ORAL
  Filled 2021-11-13 (×15): qty 1

## 2021-11-13 MED ORDER — LORAZEPAM 1 MG PO TABS
1.0000 mg | ORAL_TABLET | Freq: Once | ORAL | Status: AC
  Administered 2021-11-13: 1 mg via ORAL
  Filled 2021-11-13: qty 1

## 2021-11-13 MED ORDER — PANTOPRAZOLE SODIUM 40 MG PO TBEC
40.0000 mg | DELAYED_RELEASE_TABLET | Freq: Two times a day (BID) | ORAL | Status: DC
  Administered 2021-11-13 – 2021-11-20 (×15): 40 mg via ORAL
  Filled 2021-11-13 (×16): qty 1

## 2021-11-13 MED ORDER — ACETAMINOPHEN 325 MG PO TABS
650.0000 mg | ORAL_TABLET | Freq: Four times a day (QID) | ORAL | Status: DC | PRN
  Administered 2021-11-13 – 2021-11-15 (×4): 650 mg via ORAL
  Filled 2021-11-13 (×4): qty 2

## 2021-11-13 MED ORDER — POTASSIUM CHLORIDE CRYS ER 20 MEQ PO TBCR
40.0000 meq | EXTENDED_RELEASE_TABLET | Freq: Every day | ORAL | Status: DC
  Administered 2021-11-13 – 2021-11-20 (×8): 40 meq via ORAL
  Filled 2021-11-13 (×8): qty 2

## 2021-11-13 MED ORDER — BUPRENORPHINE HCL-NALOXONE HCL 8-2 MG SL SUBL
1.0000 | SUBLINGUAL_TABLET | Freq: Every day | SUBLINGUAL | Status: DC
  Administered 2021-11-13 – 2021-11-20 (×8): 1 via SUBLINGUAL
  Filled 2021-11-13 (×8): qty 1

## 2021-11-13 MED ORDER — OLANZAPINE 10 MG PO TABS
10.0000 mg | ORAL_TABLET | Freq: Every day | ORAL | Status: DC
  Administered 2021-11-13 – 2021-11-14 (×2): 10 mg via ORAL
  Filled 2021-11-13 (×2): qty 1

## 2021-11-13 MED ORDER — ADULT MULTIVITAMIN W/MINERALS CH
1.0000 | ORAL_TABLET | Freq: Every day | ORAL | Status: DC
  Administered 2021-11-13 – 2021-11-20 (×8): 1 via ORAL
  Filled 2021-11-13 (×8): qty 1

## 2021-11-13 MED ORDER — MAGNESIUM HYDROXIDE 400 MG/5ML PO SUSP: 30.0000 mL | Freq: Every day | ORAL | Status: DC | PRN

## 2021-11-13 MED ORDER — TOPIRAMATE 25 MG PO TABS
50.0000 mg | ORAL_TABLET | Freq: Every day | ORAL | Status: DC
  Administered 2021-11-13 – 2021-11-15 (×3): 50 mg via ORAL
  Filled 2021-11-13 (×3): qty 2

## 2021-11-13 MED ORDER — ALUM & MAG HYDROXIDE-SIMETH 200-200-20 MG/5ML PO SUSP
30.0000 mL | ORAL | Status: DC | PRN
  Administered 2021-11-13 – 2021-11-14 (×2): 30 mL via ORAL
  Filled 2021-11-13 (×2): qty 30

## 2021-11-13 MED ORDER — ENSURE ENLIVE PO LIQD
237.0000 mL | Freq: Two times a day (BID) | ORAL | Status: DC
  Administered 2021-11-13 – 2021-11-20 (×14): 237 mL via ORAL

## 2021-11-13 MED ORDER — HYDROXYZINE HCL 50 MG PO TABS: 50.0000 mg | ORAL_TABLET | Freq: Four times a day (QID) | ORAL | Status: DC | PRN

## 2021-11-13 MED ORDER — TRAZODONE HCL 100 MG PO TABS
100.0000 mg | ORAL_TABLET | Freq: Every evening | ORAL | Status: DC | PRN
  Administered 2021-11-13 – 2021-11-19 (×7): 100 mg via ORAL
  Filled 2021-11-13 (×7): qty 1

## 2021-11-13 MED ORDER — NICOTINE POLACRILEX 2 MG MT GUM
2.0000 mg | CHEWING_GUM | OROMUCOSAL | Status: DC | PRN
  Administered 2021-11-13 – 2021-11-15 (×6): 2 mg via ORAL
  Filled 2021-11-13 (×8): qty 1

## 2021-11-13 MED ORDER — NICOTINE 21 MG/24HR TD PT24
21.0000 mg | MEDICATED_PATCH | Freq: Every day | TRANSDERMAL | Status: DC
  Administered 2021-11-13 – 2021-11-15 (×3): 21 mg via TRANSDERMAL
  Filled 2021-11-13 (×7): qty 1

## 2021-11-13 MED ORDER — HYDROXYZINE HCL 50 MG PO TABS
50.0000 mg | ORAL_TABLET | Freq: Four times a day (QID) | ORAL | Status: DC | PRN
  Administered 2021-11-13 – 2021-11-19 (×10): 50 mg via ORAL
  Filled 2021-11-13 (×10): qty 1

## 2021-11-13 MED ORDER — THIAMINE MONONITRATE 100 MG PO TABS
100.0000 mg | ORAL_TABLET | Freq: Every day | ORAL | Status: DC
  Administered 2021-11-13 – 2021-11-20 (×8): 100 mg via ORAL
  Filled 2021-11-13 (×8): qty 1

## 2021-11-13 NOTE — Plan of Care (Signed)
Patient stated that he had a good day today. Patient little intrusive at times but receptive with redirection. Patient happy about the Suboxone ordered. Denies SI,HI and AVH. Visible in the milieu. Appetite and energy level good. Support and encouragement given.

## 2021-11-13 NOTE — H&P (Signed)
Psychiatric Admission Assessment Adult  Patient Identification: Ronald Hoffman MRN:  [id number] Date of Evaluation:  11/13/2021 Chief Complaint:  Schizophrenia (HCC) [F20.9] Principal Diagnosis: Schizophrenia (HCC) Diagnosis:  Principal Problem:   Schizophrenia (HCC) Active Problems:   Substance-induced psychotic disorder (HCC)   Alcohol abuse   Cocaine abuse (HCC)  History of Present Illness: Patient seen and chart reviewed.  41 year old man transferred from [place].  He presented to the emergency room on September 29 brought in by family concerned about behavior.  Patient now describes himself as having had "a panic attack".  Said that he was having confused thoughts and voices in his head making him "think of heaven".  Patient says he is been not working for the last couple of years stopped all medications and treatment at least 6 months ago.  Used to go to methadone clinic but has not been going there for a while.  He admits that he uses street drugs usually getting Xanax is off the street sometimes narcotics.  Claims that he is not drinking at all.  Denies other drug use although his drug screen is positive for cocaine.  Recalls having had passive suicidal thoughts but has no current suicidal intent or plan.  Patient is extremely focused on physical symptoms primarily pain in both of his legs that he says is present below the knees.  He is very insistent on having narcotics for it.  Patient had to be treated in [place] for coffee ground emesis but his hemoglobin appears to have stabilized. Associated Signs/Symptoms: Depression Symptoms:  depressed mood, difficulty concentrating, hopelessness, suicidal thoughts without plan, anxiety, panic attacks, Duration of Depression Symptoms: Greater than two weeks  (Hypo) Manic Symptoms:  Impulsivity, Irritable Mood, Anxiety Symptoms:  Excessive Worry, Panic Symptoms, Psychotic Symptoms:  Hallucinations: Auditory PTSD  Symptoms: Negative Total Time spent with patient: 1 hour  Past Psychiatric History: Patient has a long history of presenting with what seemed to be psychotic or mood symptoms although the underlying situation is almost universally substance use disorder.  History of abuse of multiple substances including alcohol and narcotics cocaine.  Patient's current report is that he had been using Xanax but had stopped a few days before coming to the hospital.  His drug screen was positive for cocaine but negative for benzodiazepines and negative for opiates.  Does not seem like he is followed up very much with outpatient psychiatric medicine has little familiarity with psych medicines but does say he used to go to a methadone clinic regularly.  Is the patient at risk to self? Yes.    Has the patient been a risk to self in the past 6 months? Yes.    Has the patient been a risk to self within the distant past? Yes.    Is the patient a risk to others? Yes.    Has the patient been a risk to others in the past 6 months? Yes.    Has the patient been a risk to others within the distant past? Yes.     [place] Scale:  Flowsheet Row Admission (Current) from 11/13/2021 in [facility] INPATIENT BEHAVIORAL MEDICINE ED to Hosp-Admission (Discharged) from 11/08/2021 in [facility] ED to Hosp-Admission (Discharged) from 02/20/2021 in [facility] MEDICAL SURGICAL UNIT  C-SSRS RISK CATEGORY No Risk Low Risk No Risk        Prior Inpatient Therapy:   Prior Outpatient Therapy:    Alcohol Screening: 1. How often do you have a drink containing alcohol?: 2 to  4 times a month 2. How many drinks containing alcohol do you have on a typical day when you are drinking?: 1 or 2 3. How often do you have six or more drinks on one occasion?: Less than monthly AUDIT-C Score: 3 4. How often during the last year have you found that you were not able to stop drinking once you had started?: Never 5. How often during the last  year have you failed to do what was normally expected from you because of drinking?: Never 6. How often during the last year have you needed a first drink in the morning to get yourself going after a heavy drinking session?: Never 7. How often during the last year have you had a feeling of guilt of remorse after drinking?: Never 8. How often during the last year have you been unable to remember what happened the night before because you had been drinking?: Never 9. Have you or someone else been injured as a result of your drinking?: No 10. Has a relative or friend or a doctor or another health worker been concerned about your drinking or suggested you cut down?: No Alcohol Use Disorder Identification Test Final Score (AUDIT): 3 Alcohol Brief Interventions/Follow-up:  (low risk) Substance Abuse History in the last 12 months:  Yes.   Consequences of Substance Abuse: Patient admits to substance use disorder and looking back over the old chart it seems to probably be the underlying problem for most of his history with worsening mental health problems and behavior problems.  Probably also contributing to the coffee ground emesis.  Could be related to his pain in his legs. Previous Psychotropic Medications: Yes  Psychological Evaluations: Yes  Past Medical History:  Past Medical History:  Diagnosis Date   Acute psychosis (HCC) 10/26/2019   Anxiety    Delusional disorder (HCC) 10/25/2019   Drug abuse (HCC) 03/19/2020   Hyperthyroidism    Lactic acidosis 02/21/2021   Seizures (HCC)    stress related; no more seizures since first one 10 years ago; on no meds.   Sepsis (HCC)    SIRS (systemic inflammatory response syndrome) (HCC) 02/20/2021   Substance-induced psychotic disorder (HCC) 03/19/2020   Tachycardia     Past Surgical History:  Procedure Laterality Date   HEMORRHOID SURGERY N/A 11/12/2015   Procedure: EXTENSIVE HEMORRHOIDECTOMY;  Surgeon: Franky MachoMark Jenkins, MD;  Location: [facility];  Service: General;   Laterality: N/A;   Family History: History reviewed. No pertinent family history. Family Psychiatric  History: None reported Tobacco Screening:   Social History:  Social History   Substance and Sexual Activity  Alcohol Use Yes   Comment: last drink 10/25/2019, pt said he had 3 beers     Social History   Substance and Sexual Activity  Drug Use Yes   Types: Heroin, Methamphetamines, Other-see comments   Comment: was using synthetic heroin daily for 2 years but stopped 2 months ago, methamphetamine daily for 3 months until June (last use 10/25/2019- "just a pinch"), "mushrooms yesterday," and "ketamine today"    Additional Social History:                           Allergies:   Allergies  Allergen Reactions   Hydrocodone Itching   Penicillins Other (See Comments)    Unknown childhood allergy Has patient had a PCN reaction causing immediate rash, facial/tongue/throat swelling, SOB or lightheadedness with hypotension: unknown Has patient had a PCN reaction causing severe rash involving  mucus membranes or skin necrosis: unknown Has patient had a PCN reaction that required hospitalization: unknown Has patient had a PCN reaction occurring within the last 10 years:no If all of the above answers are "NO", then may proceed with Cephalosporin use.    Lab Results:  Results for orders placed or performed during the hospital encounter of 11/08/21 (from the past 48 hour(s))  CBC with Differential/Platelet     Status: Abnormal   Collection Time: 11/12/21 11:50 AM  Result Value Ref Range   WBC 8.5 4.0 - 10.5 K/uL   RBC 4.60 4.22 - 5.81 MIL/uL   Hemoglobin 13.1 13.0 - 17.0 g/dL   HCT 53.6 (L) 46.8 - 03.2 %   MCV 80.4 80.0 - 100.0 fL   MCH 28.5 26.0 - 34.0 pg   MCHC 35.4 30.0 - 36.0 g/dL   RDW 12.2 48.2 - 50.0 %   Platelets 298 150 - 400 K/uL   nRBC 0.0 0.0 - 0.2 %   Neutrophils Relative % 76 %   Neutro Abs 6.4 1.7 - 7.7 K/uL   Lymphocytes Relative 17 %   Lymphs Abs 1.4 0.7  - 4.0 K/uL   Monocytes Relative 6 %   Monocytes Absolute 0.5 0.1 - 1.0 K/uL   Eosinophils Relative 1 %   Eosinophils Absolute 0.1 0.0 - 0.5 K/uL   Basophils Relative 0 %   Basophils Absolute 0.0 0.0 - 0.1 K/uL   Immature Granulocytes 0 %   Abs Immature Granulocytes 0.01 0.00 - 0.07 K/uL    Comment: Performed at [facility], [address] [place], [place] [address]  SARS Coronavirus 2 by RT PCR (hospital order, performed in [facility] hospital lab) *cepheid single result test*     Status: None   Collection Time: 11/12/21 12:45 PM  Result Value Ref Range   SARS Coronavirus 2 by RT PCR NEGATIVE NEGATIVE    Comment: (NOTE) SARS-CoV-2 target nucleic acids are NOT DETECTED.  The SARS-CoV-2 RNA is generally detectable in upper and lower respiratory specimens during the acute phase of infection. The lowest concentration of SARS-CoV-2 viral copies this assay can detect is 250 copies / mL. A negative result does not preclude SARS-CoV-2 infection and should not be used as the sole basis for treatment or other patient management decisions.  A negative result may occur with improper specimen collection / handling, submission of specimen other than nasopharyngeal swab, presence of viral mutation(s) within the areas targeted by this assay, and inadequate number of viral copies (<250 copies / mL). A negative result must be combined with clinical observations, patient history, and epidemiological information.  Fact Sheet for Patients:   RoadLapTop.co.za  Fact Sheet for Healthcare Providers: http://kim-miller.com/  This test is not yet approved or  cleared by the [place] FDA and has been authorized for detection and/or diagnosis of SARS-CoV-2 by FDA under an Emergency Use Authorization (EUA).  This EUA will remain in effect (meaning this test can be used) for the duration of the COVID-19 declaration under Section 564(b)(1) of the Act, 21  U.S.C. section 360bbb-3(b)(1), unless the authorization is terminated or revoked sooner.  Performed at [facility], [address] [place], [place] [address]     Blood Alcohol level:  Lab Results  Component Value Date   [facility] <10 02/20/2021   ETH <10 03/19/2020    Metabolic Disorder Labs:  Lab Results  Component Value Date   HGBA1C 5.2 10/27/2019   MPG 102.54 10/27/2019   Lab Results  Component  Value Date   PROLACTIN 30.2 (H) 10/27/2019   No results found for: "CHOL", "TRIG", "HDL", "CHOLHDL", "VLDL", "LDLCALC"  Current Medications: Current Facility-Administered Medications  Medication Dose Route Frequency Provider Last Rate Last Admin   acetaminophen (TYLENOL) tablet 650 mg  650 mg Oral Q6H PRN Mardell Cragg, Jackquline Denmark, MD   650 mg at 11/13/21 0756   alum & mag hydroxide-simeth (MAALOX/MYLANTA) 200-200-20 MG/5ML suspension 30 mL  30 mL Oral Q4H PRN Kasaundra Fahrney T, MD   30 mL at 11/13/21 1436   buprenorphine-naloxone (SUBOXONE) 8-2 mg per SL tablet 1 tablet  1 tablet Sublingual Daily Sammy Cassar, Jackquline Denmark, MD   1 tablet at 11/13/21 1104   feeding supplement (ENSURE ENLIVE / ENSURE PLUS) liquid 237 mL  237 mL Oral BID BM Novalynn Branaman T, MD   237 mL at 11/13/21 1437   gabapentin (NEURONTIN) capsule 300 mg  300 mg Oral TID Eunie Lawn T, MD   300 mg at 11/13/21 1104   hydrOXYzine (ATARAX) tablet 50 mg  50 mg Oral Q6H PRN Gal Smolinski, Jackquline Denmark, MD   50 mg at 11/13/21 0981   magnesium hydroxide (MILK OF MAGNESIA) suspension 30 mL  30 mL Oral Daily PRN Nhat Hearne, Jackquline Denmark, MD       multivitamin with minerals tablet 1 tablet  1 tablet Oral Daily Sherae Santino, Jackquline Denmark, MD   1 tablet at 11/13/21 0955   nicotine (NICODERM CQ - dosed in mg/24 hours) patch 21 mg  21 mg Transdermal Daily Eleina Jergens, Jackquline Denmark, MD   21 mg at 11/13/21 0754   nicotine polacrilex (NICORETTE) gum 2 mg  2 mg Oral PRN Redding Cloe, Jackquline Denmark, MD       OLANZapine (ZYPREXA) tablet 10 mg  10 mg Oral QHS Jarielys Girardot T, MD       pantoprazole (PROTONIX) EC  tablet 40 mg  40 mg Oral BID Taneil Lazarus T, MD   40 mg at 11/13/21 0754   potassium chloride SA (KLOR-CON M) CR tablet 40 mEq  40 mEq Oral Daily Meyer Arora, Jackquline Denmark, MD   40 mEq at 11/13/21 0754   thiamine (VITAMIN B1) tablet 100 mg  100 mg Oral Daily Kimmarie Pascale, Jackquline Denmark, MD   100 mg at 11/13/21 1914   Or   thiamine (VITAMIN B1) injection 100 mg  100 mg Intravenous Daily Dmitriy Gair, Jackquline Denmark, MD       topiramate (TOPAMAX) tablet 50 mg  50 mg Oral Daily Shiann Kam, Jackquline Denmark, MD   50 mg at 11/13/21 0754   PTA Medications: Medications Prior to Admission  Medication Sig Dispense Refill Last Dose   acetaminophen (TYLENOL) 325 MG tablet Take 2 tablets (650 mg total) by mouth every 6 (six) hours as needed for mild pain (or Fever >/= 101).      folic acid (FOLVITE) 1 MG tablet Take 1 tablet (1 mg total) by mouth daily. 30 tablet 1    gabapentin (NEURONTIN) 400 MG capsule Take 1 capsule (400 mg total) by mouth 3 (three) times daily. 90 capsule 1    LORazepam (ATIVAN) 1 MG tablet Take 1 tablet (1 mg total) by mouth every 6 (six) hours as needed for anxiety or sleep. 20 tablet 0    metoprolol tartrate (LOPRESSOR) 50 MG tablet Take 1 tablet (50 mg total) by mouth 2 (two) times daily. 60 tablet 2    Multiple Vitamin (MULTIVITAMIN WITH MINERALS) TABS tablet Take 1 tablet by mouth daily. 30 tablet 1    nicotine (NICODERM CQ -  DOSED IN MG/24 HOURS) 21 mg/24hr patch Place 1 patch (21 mg total) onto the skin daily. 28 patch 0    OLANZapine (ZYPREXA) 10 MG tablet Take 1 tablet (10 mg total) by mouth at bedtime. 30 tablet 1    oxyCODONE (OXY IR/ROXICODONE) 5 MG immediate release tablet Take 1 tablet (5 mg total) by mouth every 6 (six) hours as needed for severe pain. 20 tablet 0    pantoprazole (PROTONIX) 40 MG tablet Take 1 tablet (40 mg total) by mouth 2 (two) times daily. 60 tablet 2    potassium chloride SA (KLOR-CON M) 20 MEQ tablet Take 2 tablets (40 mEq total) by mouth daily. 30 tablet 0    thiamine (VITAMIN B-1) 100 MG  tablet Take 1 tablet (100 mg total) by mouth daily. 30 tablet 1    topiramate (TOPAMAX) 50 MG tablet Take 1 tablet (50 mg total) by mouth daily. 30 tablet 1     Musculoskeletal: Strength & Muscle Tone: within normal limits Gait & Station: normal Patient leans: N/A            Psychiatric Specialty Exam:  Presentation  General Appearance:  Appropriate for Environment; Casual; Fairly Groomed  Eye Contact: Fair  Speech: Clear and Coherent; Normal Rate  Speech Volume: Normal  Handedness: Right   Mood and Affect  Mood: Anxious; Dysphoric  Affect: Congruent; Constricted   Thought Process  Thought Processes: Coherent; Disorganized  Duration of Psychotic Symptoms: Greater than six months  Past Diagnosis of Schizophrenia or Psychoactive disorder: Yes  Descriptions of Associations:Loose  Orientation:Partial  Thought Content:Illogical; Scattered  Hallucinations:No data recorded Ideas of Reference:Delusions  Suicidal Thoughts:No data recorded Homicidal Thoughts:No data recorded  Sensorium  Memory: Immediate Fair  Judgment: Impaired  Insight: Lacking   Executive Functions  Concentration: Fair  Attention Span: Fair  Recall: Poor  Fund of Knowledge: Fair  Language: Fair   Psychomotor Activity  Psychomotor Activity:No data recorded  Assets  Assets: Communication Skills; Physical Health; Social Support   Sleep  Sleep:No data recorded   Physical Exam: Physical Exam Vitals and nursing note reviewed.  Constitutional:      Appearance: Normal appearance.  HENT:     Head: Normocephalic and atraumatic.     Mouth/Throat:     Pharynx: Oropharynx is clear.  Eyes:     Pupils: Pupils are equal, round, and reactive to light.  Cardiovascular:     Rate and Rhythm: Normal rate and regular rhythm.  Pulmonary:     Effort: Pulmonary effort is normal.     Breath sounds: Normal breath sounds.  Abdominal:     General: Abdomen is flat.      Palpations: Abdomen is soft.  Musculoskeletal:        General: Normal range of motion.  Skin:    General: Skin is warm and dry.  Neurological:     General: No focal deficit present.     Mental Status: He is alert. Mental status is at baseline.  Psychiatric:        Attention and Perception: He is inattentive.        Mood and Affect: Mood is anxious and depressed.        Speech: Speech is delayed.        Behavior: Behavior is slowed.        Thought Content: Thought content includes suicidal ideation. Thought content does not include suicidal plan.        Cognition and Memory: Memory is impaired.  Judgment: Judgment is inappropriate.    Review of Systems  Constitutional: Negative.   HENT: Negative.    Eyes: Negative.   Respiratory: Negative.    Cardiovascular: Negative.   Gastrointestinal: Negative.   Musculoskeletal:  Positive for myalgias.  Skin: Negative.   Neurological: Negative.   Psychiatric/Behavioral:  Positive for depression, hallucinations, memory loss, substance abuse and suicidal ideas. The patient is nervous/anxious and has insomnia.    Blood pressure (!) 113/95, pulse (!) 119, temperature 98.4 F (36.9 C), resp. rate 16, height 5\' 5"  (1.651 m), weight 63.5 kg, SpO2 99 %. Body mass index is 23.3 kg/m.  Treatment Plan Summary: Medication management and Plan patient was insistent on narcotics for the pain in his legs.  I suggested starting Suboxone for him which would be a more appropriate treatment and probably appropriate outpatient if he agrees to follow up.  Also 2 days of Ativan for withdrawal symptoms but not further benzodiazepine beyond and that.  Continue olanzapine for psychotic symptoms and mood stabilization.  Observation Level/Precautions:  15 minute checks  Laboratory:  Chemistry Profile  Psychotherapy:    Medications:    Consultations:    Discharge Concerns:    Estimated LOS:  Other:     Physician Treatment Plan for Primary Diagnosis:  Schizophrenia (HCC) Long Term Goal(s): Improvement in symptoms so as ready for discharge  Short Term Goals: Ability to verbalize feelings will improve, Ability to disclose and discuss suicidal ideas, and Ability to demonstrate self-control will improve  Physician Treatment Plan for Secondary Diagnosis: Principal Problem:   Schizophrenia (HCC) Active Problems:   Substance-induced psychotic disorder (HCC)   Alcohol abuse   Cocaine abuse (HCC)  Long Term Goal(s): Improvement in symptoms so as ready for discharge  Short Term Goals: Ability to maintain clinical measurements within normal limits will improve and Compliance with prescribed medications will improve  I certify that inpatient services furnished can reasonably be expected to improve the patient's condition.    , MD 10/4/20233:49 PM

## 2021-11-13 NOTE — Progress Notes (Signed)
NUTRITION ASSESSMENT  Pt identified as at risk on the Malnutrition Screen Tool  INTERVENTION:  -MVI with minerals daily -Ensure Enlive po BID, each supplement provides 350 kcal and 20 grams of protein  NUTRITION DIAGNOSIS: Unintentional weight loss related to sub-optimal intake as evidenced by pt report.   Goal: Pt to meet >/= 90% of their estimated nutrition needs.  Monitor:  PO intake  Assessment:  Pt with medical history significant of history of SVT, polysubstance abuse, schizophrenia, anxiety and delusional disorder. Presented after missing multiple days of his medications and using recreational drugs that had caused him to experience substance-induced psychotic disorder  Pt currently on a regular diet. No meal completion data available to assess at this time.   Reviewed wt hx; pt has experienced a 4.4% wt loss over the past 9 months, which is not significant for time frame.  Medications reviewed and include vitamin B-1.   Labs reviewed: K: 3.1.    41 y.o. male  Height: Ht Readings from Last 1 Encounters:  11/13/21 5\' 5"  (1.651 m)    Weight: Wt Readings from Last 1 Encounters:  11/13/21 63.5 kg    Weight Hx: Wt Readings from Last 10 Encounters:  11/13/21 63.5 kg  11/09/21 62.4 kg  02/21/21 66.4 kg  03/18/20 74.8 kg  10/25/19 64.2 kg  11/07/15 57.4 kg  02/21/14 63.5 kg  03/13/13 64.4 kg  08/11/11 68 kg    BMI:  Body mass index is 23.3 kg/m. BMI WDL  Estimated Nutritional Needs: Kcal: 25-30 kcal/kg Protein: > 1 gram protein/kg Fluid: 1 ml/kcal  Diet Order:  Diet Order             Diet regular Room service appropriate? Yes; Fluid consistency: Thin  Diet effective now                  Pt is also offered choice of unit snacks mid-morning and mid-afternoon.  Pt is eating as desired.   Lab results and medications reviewed.   Loistine Chance, RD, LDN, [place] Registered Dietitian II Certified Diabetes Care and Education Specialist Please refer  to [facility] for RD and/or RD on-call/weekend/after hours pager

## 2021-11-13 NOTE — Plan of Care (Signed)
Patient is a new admission to the unit tonight, hasn't had time to progress.  Problem: Education: Goal: Knowledge of General Education information will improve Description: Including pain rating scale, medication(s)/side effects and non-pharmacologic comfort measures Outcome: Not Progressing   Problem: Health Behavior/Discharge Planning: Goal: Ability to manage health-related needs will improve Outcome: Not Progressing   Problem: Clinical Measurements: Goal: Ability to maintain clinical measurements within normal limits will improve Outcome: Not Progressing Goal: Will remain free from infection Outcome: Not Progressing Goal: Diagnostic test results will improve Outcome: Not Progressing Goal: Respiratory complications will improve Outcome: Not Progressing Goal: Cardiovascular complication will be avoided Outcome: Not Progressing   Problem: Activity: Goal: Risk for activity intolerance will decrease Outcome: Not Progressing   Problem: Nutrition: Goal: Adequate nutrition will be maintained Outcome: Not Progressing   Problem: Coping: Goal: Level of anxiety will decrease Outcome: Not Progressing   Problem: Elimination: Goal: Will not experience complications related to bowel motility Outcome: Not Progressing Goal: Will not experience complications related to urinary retention Outcome: Not Progressing   Problem: Pain Managment: Goal: General experience of comfort will improve Outcome: Not Progressing   Problem: Safety: Goal: Ability to remain free from injury will improve Outcome: Not Progressing   Problem: Skin Integrity: Goal: Risk for impaired skin integrity will decrease Outcome: Not Progressing

## 2021-11-13 NOTE — BH IP Treatment Plan (Addendum)
Interdisciplinary Treatment and Diagnostic Plan Update  11/13/2021 Time of Session: 09:38  Ronald Hoffman MRN: [id number]  Principal Diagnosis: Schizophrenia [facility])  Secondary Diagnoses: Principal Problem:   Schizophrenia ([place])   Current Medications:  Current Facility-Administered Medications  Medication Dose Route Frequency Provider Last Rate Last Admin   acetaminophen (TYLENOL) tablet 650 mg  650 mg Oral Q6H PRN Clapacs, Madie Reno, MD   650 mg at 11/13/21 0756   alum & mag hydroxide-simeth (MAALOX/MYLANTA) 200-200-20 MG/5ML suspension 30 mL  30 mL Oral Q4H PRN Clapacs, Madie Reno, MD       feeding supplement (ENSURE ENLIVE / ENSURE PLUS) liquid 237 mL  237 mL Oral BID BM Clapacs, John T, MD   237 mL at 11/13/21 0955   hydrOXYzine (ATARAX) tablet 50 mg  50 mg Oral Q6H PRN Clapacs, Madie Reno, MD   50 mg at 11/13/21 8938   magnesium hydroxide (MILK OF MAGNESIA) suspension 30 mL  30 mL Oral Daily PRN Clapacs, Madie Reno, MD       multivitamin with minerals tablet 1 tablet  1 tablet Oral Daily Clapacs, Madie Reno, MD   1 tablet at 11/13/21 0955   nicotine (NICODERM CQ - dosed in mg/24 hours) patch 21 mg  21 mg Transdermal Daily Clapacs, John T, MD   21 mg at 11/13/21 0754   OLANZapine (ZYPREXA) tablet 10 mg  10 mg Oral QHS Clapacs, John T, MD       pantoprazole (PROTONIX) EC tablet 40 mg  40 mg Oral BID Clapacs, John T, MD   40 mg at 11/13/21 0754   potassium chloride SA (KLOR-CON M) CR tablet 40 mEq  40 mEq Oral Daily Clapacs, Madie Reno, MD   40 mEq at 11/13/21 0754   thiamine (VITAMIN B1) tablet 100 mg  100 mg Oral Daily Clapacs, Madie Reno, MD   100 mg at 11/13/21 1017   Or   thiamine (VITAMIN B1) injection 100 mg  100 mg Intravenous Daily Clapacs, Madie Reno, MD       topiramate (TOPAMAX) tablet 50 mg  50 mg Oral Daily Clapacs, Madie Reno, MD   50 mg at 11/13/21 0754   PTA Medications: Medications Prior to Admission  Medication Sig Dispense Refill Last Dose   acetaminophen (TYLENOL) 325 MG tablet Take 2 tablets (650  mg total) by mouth every 6 (six) hours as needed for mild pain (or Fever >/= 101).      folic acid (FOLVITE) 1 MG tablet Take 1 tablet (1 mg total) by mouth daily. 30 tablet 1    gabapentin (NEURONTIN) 400 MG capsule Take 1 capsule (400 mg total) by mouth 3 (three) times daily. 90 capsule 1    LORazepam (ATIVAN) 1 MG tablet Take 1 tablet (1 mg total) by mouth every 6 (six) hours as needed for anxiety or sleep. 20 tablet 0    metoprolol tartrate (LOPRESSOR) 50 MG tablet Take 1 tablet (50 mg total) by mouth 2 (two) times daily. 60 tablet 2    Multiple Vitamin (MULTIVITAMIN WITH MINERALS) TABS tablet Take 1 tablet by mouth daily. 30 tablet 1    nicotine (NICODERM CQ - DOSED IN MG/24 HOURS) 21 mg/24hr patch Place 1 patch (21 mg total) onto the skin daily. 28 patch 0    OLANZapine (ZYPREXA) 10 MG tablet Take 1 tablet (10 mg total) by mouth at bedtime. 30 tablet 1    oxyCODONE (OXY IR/ROXICODONE) 5 MG immediate release tablet Take 1 tablet (5 mg total) by  mouth every 6 (six) hours as needed for severe pain. 20 tablet 0    pantoprazole (PROTONIX) 40 MG tablet Take 1 tablet (40 mg total) by mouth 2 (two) times daily. 60 tablet 2    potassium chloride SA (KLOR-CON M) 20 MEQ tablet Take 2 tablets (40 mEq total) by mouth daily. 30 tablet 0    thiamine (VITAMIN B-1) 100 MG tablet Take 1 tablet (100 mg total) by mouth daily. 30 tablet 1    topiramate (TOPAMAX) 50 MG tablet Take 1 tablet (50 mg total) by mouth daily. 30 tablet 1     Patient Stressors:    Patient Strengths:    Treatment Modalities: Medication Management, Group therapy, Case management,  1 to 1 session with clinician, Psychoeducation, Recreational therapy.   Physician Treatment Plan for Primary Diagnosis: Schizophrenia ([place]) Long Term Goal(s):     Short Term Goals:    Medication Management: Evaluate patient's response, side effects, and tolerance of medication regimen.  Therapeutic Interventions: 1 to 1 sessions, Unit Group sessions  and Medication administration.  Evaluation of Outcomes: Not Met  Physician Treatment Plan for Secondary Diagnosis: Principal Problem:   Schizophrenia ([place])  Long Term Goal(s):     Short Term Goals:       Medication Management: Evaluate patient's response, side effects, and tolerance of medication regimen.  Therapeutic Interventions: 1 to 1 sessions, Unit Group sessions and Medication administration.  Evaluation of Outcomes: Not Met   RN Treatment Plan for Primary Diagnosis: Schizophrenia ([place]) Long Term Goal(s): Knowledge of disease and therapeutic regimen to maintain health will improve  Short Term Goals: Ability to remain free from injury will improve, Ability to verbalize frustration and anger appropriately will improve, Ability to demonstrate self-control, Ability to participate in decision making will improve, Ability to verbalize feelings will improve, Ability to disclose and discuss suicidal ideas, Ability to identify and develop effective coping behaviors will improve, and Compliance with prescribed medications will improve  Medication Management: RN will administer medications as ordered by provider, will assess and evaluate patient's response and provide education to patient for prescribed medication. RN will report any adverse and/or side effects to prescribing provider.  Therapeutic Interventions: 1 on 1 counseling sessions, Psychoeducation, Medication administration, Evaluate responses to treatment, Monitor vital signs and CBGs as ordered, Perform/monitor CIWA, COWS, AIMS and Fall Risk screenings as ordered, Perform wound care treatments as ordered.  Evaluation of Outcomes: Not Met   LCSW Treatment Plan for Primary Diagnosis: Schizophrenia ([place]) Long Term Goal(s): Safe transition to appropriate next level of care at discharge, Engage patient in therapeutic group addressing interpersonal concerns.  Short Term Goals: Engage patient in aftercare planning with referrals and  resources, Increase social support, Increase ability to appropriately verbalize feelings, Increase emotional regulation, Facilitate acceptance of mental health diagnosis and concerns, Facilitate patient progression through stages of change regarding substance use diagnoses and concerns, Identify triggers associated with mental health/substance abuse issues, and Increase skills for wellness and recovery  Therapeutic Interventions: Assess for all discharge needs, 1 to 1 time with Social worker, Explore available resources and support systems, Assess for adequacy in community support network, Educate family and significant other(s) on suicide prevention, Complete Psychosocial Assessment, Interpersonal group therapy.  Evaluation of Outcomes: Not Met   Progress in Treatment: Attending groups: No. Participating in groups: No. Taking medication as prescribed: Yes. Toleration medication: Yes. Family/Significant other contact made: No, will contact:  if given permission. Patient understands diagnosis: Yes. Discussing patient identified problems/goals with staff: Yes. Medical problems  stabilized or resolved: Yes. Denies suicidal/homicidal ideation: Yes. Issues/concerns per patient self-inventory: No. Other: none.  New problem(s) identified: No, Describe:  none identified.  New Short Term/Long Term Goal(s): detox, elimination of symptoms of psychosis, medication management for mood stabilization; elimination of SI thoughts; development of comprehensive mental wellness/sobriety plan.  Patient Goals:  "I need to work on my shakiness, nervousness, and my pain levels."  Discharge Plan or Barriers: CSW will assist pt with development of an appropriate aftercare/discharge plan.   Reason for Continuation of Hospitalization: Anxiety Medication stabilization Suicidal ideation Withdrawal symptoms  Estimated Length of Stay: 1-7 days  Last 3 [place] Suicide Severity Risk Score: [place] Admission  (Current) from 11/13/2021 in [place] ED to Hosp-Admission (Discharged) from 11/08/2021 in [place] ED to Hosp-Admission (Discharged) from 02/20/2021 in [place] No Risk Low Risk No Risk       Last PHQ 2/9 Scores:     No data to display          Scribe for Treatment Team: Shirl Harris, LCSW 11/13/2021 9:59 AM

## 2021-11-13 NOTE — Progress Notes (Signed)
Patient presents with a bizarre affect, speaking latin at times to staff. Pt observed interacting appropriately with staff and peers on the unit. Pt compliant with medication administration per MD orders. Pt given education, support, and encouragement to be active in his treatment plan. Pt being monitored Q 15 minutes for safety per unit protocol, remains safe on the unit 
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
## 2021-11-13 NOTE — BHH Suicide Risk Assessment (Signed)
[facility] Admission Suicide Risk Assessment   Nursing information obtained from:  Patient Demographic factors:  Male, Unemployed, Caucasian Current Mental Status:  NA Loss Factors:  Decline in physical health, Financial problems / change in socioeconomic status Historical Factors:  NA Risk Reduction Factors:  Living with another person, especially a relative  Total Time spent with patient: 45 minutes Principal Problem: Schizophrenia (HCC) Diagnosis:  Principal Problem:   Schizophrenia (HCC) Active Problems:   Substance-induced psychotic disorder (HCC)   Alcohol abuse   Cocaine abuse (HCC)  Subjective Data: Patient seen and chart reviewed.  41 year old man with a history of substance use problems and was sent here in transfer from [place].  He presented to the emergency room on September 29 brought in by family concerned about changes in behavior aggression and agitation bizarre behavior.  Patient was found to be throwing up blood agitated and confused and was admitted to the medical unit before being transferred to our facility.  Patient made suicidal statements there.  On report today reports having recently had passive suicidal thoughts denies any intention or plan currently.  Primarily focused on somatic symptoms  Continued Clinical Symptoms:  Alcohol Use Disorder Identification Test Final Score (AUDIT): 3 The "Alcohol Use Disorders Identification Test", Guidelines for Use in Primary Care, Second Edition.  World Science writer [facility]). Score between 0-7:  no or low risk or alcohol related problems. Score between 8-15:  moderate risk of alcohol related problems. Score between 16-19:  high risk of alcohol related problems. Score 20 or above:  warrants further diagnostic evaluation for alcohol dependence and treatment.   CLINICAL FACTORS:   Alcohol/Substance Abuse/Dependencies Schizophrenia:   Depressive state   Musculoskeletal: Strength & Muscle Tone: within normal limits Gait &  Station: normal Patient leans: N/A  Psychiatric Specialty Exam:  Presentation  General Appearance:  Appropriate for Environment; Casual; Fairly Groomed  Eye Contact: Fair  Speech: Clear and Coherent; Normal Rate  Speech Volume: Normal  Handedness: Right   Mood and Affect  Mood: Anxious; Dysphoric  Affect: Congruent; Constricted   Thought Process  Thought Processes: Coherent; Disorganized  Descriptions of Associations:Loose  Orientation:Partial  Thought Content:Illogical; Scattered  History of Schizophrenia/Schizoaffective disorder:Yes  Duration of Psychotic Symptoms:Greater than six months  Hallucinations:No data recorded Ideas of Reference:Delusions  Suicidal Thoughts:No data recorded Homicidal Thoughts:No data recorded  Sensorium  Memory: Immediate Fair  Judgment: Impaired  Insight: Lacking   Executive Functions  Concentration: Fair  Attention Span: Fair  Recall: Poor  Fund of Knowledge: Fair  Language: Fair   Psychomotor Activity  Psychomotor Activity:No data recorded  Assets  Assets: Communication Skills; Physical Health; Social Support   Sleep  Sleep:No data recorded   Physical Exam: Physical Exam Vitals and nursing note reviewed.  Constitutional:      Appearance: Normal appearance.  HENT:     Head: Normocephalic and atraumatic.     Mouth/Throat:     Pharynx: Oropharynx is clear.  Eyes:     Pupils: Pupils are equal, round, and reactive to light.  Cardiovascular:     Rate and Rhythm: Normal rate and regular rhythm.  Pulmonary:     Effort: Pulmonary effort is normal.     Breath sounds: Normal breath sounds.  Abdominal:     General: Abdomen is flat.     Palpations: Abdomen is soft.  Musculoskeletal:        General: Normal range of motion.  Skin:    General: Skin is warm and dry.  Neurological:  General: No focal deficit present.     Mental Status: He is alert. Mental status is at baseline.   Psychiatric:        Attention and Perception: Attention normal.        Mood and Affect: Mood is anxious and depressed.        Speech: Speech is delayed.        Behavior: Behavior is slowed.        Thought Content: Thought content includes suicidal ideation. Thought content does not include suicidal plan.        Cognition and Memory: Memory is impaired.    Review of Systems  Constitutional: Negative.   HENT: Negative.    Eyes: Negative.   Respiratory: Negative.    Cardiovascular: Negative.   Gastrointestinal: Negative.   Musculoskeletal: Negative.   Skin: Negative.   Neurological: Negative.   Psychiatric/Behavioral:  Positive for depression, hallucinations, memory loss, substance abuse and suicidal ideas. The patient is nervous/anxious and has insomnia.    Blood pressure (!) 113/95, pulse (!) 119, temperature 98.4 F (36.9 C), resp. rate 16, height 5\' 5"  (1.651 m), weight 63.5 kg, SpO2 99 %. Body mass index is 23.3 kg/m.   COGNITIVE FEATURES THAT CONTRIBUTE TO RISK:  Thought constriction (tunnel vision)    SUICIDE RISK:   Minimal: No identifiable suicidal ideation.  Patients presenting with no risk factors but with morbid ruminations; may be classified as minimal risk based on the severity of the depressive symptoms  PLAN OF CARE: 15-minute checks.  Engage in individual and group therapy.  Labs reviewed.  Restart medication.  Appropriate treatment for substance use problems.  Daily reassessment of dangerousness prior to discharge  I certify that inpatient services furnished can reasonably be expected to improve the patient's condition.   Alethia Berthold, MD 11/13/2021, 3:33 PM

## 2021-11-13 NOTE — Plan of Care (Signed)
Patient escorted by  the local Sheriff with the discharge packet given to the officer. Discharge to [facility] ([facility]) and transferred to [facility] at 2100

## 2021-11-14 DIAGNOSIS — F203 Undifferentiated schizophrenia: Secondary | ICD-10-CM | POA: Diagnosis not present

## 2021-11-14 NOTE — Plan of Care (Signed)

## 2021-11-14 NOTE — Plan of Care (Addendum)
D- Patient alert and oriented. Patient presented in a pleasant mood on assessment reporting that he slept "fair" last night and had no complaints to voice to this Probation officer. Patient had complaints of leg pain, rating it an "8/10", stating that he has a "freezing/burning sensation". Patient did not request any PRN pain medication, stating that his scheduled Suboxone usually helps with this. Patient endorsed hopelessness, depression, and anxiety on his self-inventory, but stated to this writer "it was early, I'm ok". Patient stated that overall, he feels "pretty good". Patient denied SI, HI, AVH, stating "no, not yet". Patient's goal for today is to "get something good to eat and watch TV".  A- Scheduled medications administered to patient, per MD orders. Support and encouragement provided.  Routine safety checks conducted every 15 minutes.  Patient informed to notify staff with problems or concerns.  R- No adverse drug reactions noted. Patient contracts for safety at this time. Patient compliant with medications and treatment plan. Patient receptive, calm, and cooperative. Patient interacts well with others on the unit.  Patient remains safe at this time.  Problem: Education: Goal: Knowledge of General Education information will improve Description: Including pain rating scale, medication(s)/side effects and non-pharmacologic comfort measures Outcome: Progressing   Problem: Health Behavior/Discharge Planning: Goal: Ability to manage health-related needs will improve Outcome: Progressing   Problem: Clinical Measurements: Goal: Ability to maintain clinical measurements within normal limits will improve Outcome: Progressing Goal: Will remain free from infection Outcome: Progressing Goal: Diagnostic test results will improve Outcome: Progressing Goal: Respiratory complications will improve Outcome: Progressing Goal: Cardiovascular complication will be avoided Outcome: Progressing   Problem:  Activity: Goal: Risk for activity intolerance will decrease Outcome: Progressing   Problem: Nutrition: Goal: Adequate nutrition will be maintained Outcome: Progressing   Problem: Coping: Goal: Level of anxiety will decrease Outcome: Progressing   Problem: Elimination: Goal: Will not experience complications related to bowel motility Outcome: Progressing Goal: Will not experience complications related to urinary retention Outcome: Progressing   Problem: Pain Managment: Goal: General experience of comfort will improve Outcome: Progressing   Problem: Safety: Goal: Ability to remain free from injury will improve Outcome: Progressing   Problem: Skin Integrity: Goal: Risk for impaired skin integrity will decrease Outcome: Progressing

## 2021-11-14 NOTE — Progress Notes (Signed)
Recreation Therapy Notes  Date: 11/14/2021  Time: 10:40 am   Location: Courtyard   Behavioral response: Appropriate  Intervention Topic:  Leisure    Discussion/Intervention:  Group content today was focused on leisure. The group defined what leisure is and some positive leisure activities they participate in. Individuals identified the difference between good and bad leisure. Participants expressed how they feel after participating in the leisure of their choice. The group discussed how they go about picking a leisure activity and if others are involved in their leisure activities. The patient stated how many leisure activities they have to choose from and reasons why it is important to have leisure time. Individuals participated in the intervention "Exploration of Leisure" where they had a chance to identify new leisure activities as well as benefits of leisure. Clinical Observations/Feedback: Patient came to group and identified ways to participates in leisure. Individual was social with peers and staff while participating in the intervention.    Jenilee Franey LRT/CTRS         Annsleigh Dragoo 11/14/2021 12:23 PM

## 2021-11-14 NOTE — BHH Group Notes (Signed)
[facility] Group Notes:  (Nursing/MHT/Case Management/Adjunct)  Date:  11/14/2021  Time:  10:07 PM  Type of Therapy:  Group Therapy  Participation Level:  Active  Participation Quality:  Appropriate  Affect:  Appropriate  Cognitive:  Appropriate  Insight:  Appropriate  Engagement in Group:  Engaged  Modes of Intervention:  Support  Summary of Progress/Problems:Get off Drugs.  Ronald Hoffman 11/14/2021, 10:07 PM

## 2021-11-14 NOTE — Progress Notes (Signed)
Patient is active on the unit playing cards and hanging out with his peers. He is pleasant and cooperative and easy to engage in conversation.He is med compliant and received his meds without incident.  He denies si  hi   avh He endorses mild depression and anxiety that he reports is always there and he is "just used to it.  Continues to have  leg pain, but did not request anything for pain at this encounter.  Continues to exhibit tremors,  most notably in his hands, but also noticed in his face. Will continue to monitor with q15 minute safety checks.  Encouraged to seek staff with any concern.     C Butler-Nicholson, LPN

## 2021-11-14 NOTE — Group Note (Signed)
[facility] LCSW Group Therapy Note   Group Date: 11/14/2021 Start Time: 1300 End Time: 1400   Type of Therapy/Topic:  Group Therapy:  Emotion Regulation  Participation Level:  Active   Description of Group:    The purpose of this group is to assist patients in learning to regulate negative emotions and experience positive emotions. Patients will be guided to discuss ways in which they have been vulnerable to their negative emotions. These vulnerabilities will be juxtaposed with experiences of positive emotions or situations, and patients challenged to use positive emotions to combat negative ones. Special emphasis will be placed on coping with negative emotions in conflict situations, and patients will process healthy conflict resolution skills.  Therapeutic Goals: Patient will identify two positive emotions or experiences to reflect on in order to balance out negative emotions:  Patient will label two or more emotions that they find the most difficult to experience:  Patient will be able to demonstrate positive conflict resolution skills through discussion or role plays:   Summary of Patient Progress: Patient was present for the entirety of the group session. Patient was an active listener and participated in the topic of discussion, provided helpful advice to others, and added nuance to topic of conversation. Patient was active in solving role playing excessive.     Therapeutic Modalities:   Cognitive Behavioral Therapy Feelings Identification Dialectical Behavioral Therapy   Durenda Hurt, [place]

## 2021-11-14 NOTE — BHH Group Notes (Signed)
[employer] Group Notes:  (Nursing/MHT/Case Management/Adjunct)  Date:  11/14/2021  Time:  10:09 PM  Type of Therapy:  Group Therapy  Participation Level:  Active  Participation Quality:  Appropriate  Affect:  Appropriate  Cognitive:  Appropriate  Insight:  Good  Engagement in Group:  Engaged  Modes of Intervention:  Support  Summary of Progress/Problems:Moving forward on working on problems that lead him here.  Floyde Parkins 11/14/2021, 10:09 PM

## 2021-11-14 NOTE — Progress Notes (Signed)
[facility] MD Progress Note  11/14/2021 11:38 AM Ronald Hoffman  MRN:  [id number] Subjective: Follow-up 41 year old man with a past history of schizophrenia and substance use.  Patient today was asking me for ADHD medicine specifically Vyvanse.  His chief complaints were somewhat vague about feeling like his thoughts were not quite normal.  Does not appear to be hallucinating or psychotic.  Seem slightly anxious but vital signs are stable no sign of delirium. Principal Problem: Schizophrenia (HCC) Diagnosis: Principal Problem:   Schizophrenia (HCC) Active Problems:   Substance-induced psychotic disorder (HCC)   Alcohol abuse   Cocaine abuse (HCC)  Total Time spent with patient: 30 minutes  Past Psychiatric History: Past history of polysubstance use.  Past history of various other proposed psychiatric diagnoses including schizophrenia about which I am less certain.  Past Medical History:  Past Medical History:  Diagnosis Date   Acute psychosis (HCC) 10/26/2019   Anxiety    Delusional disorder (HCC) 10/25/2019   Drug abuse (HCC) 03/19/2020   Hyperthyroidism    Lactic acidosis 02/21/2021   Seizures (HCC)    stress related; no more seizures since first one 10 years ago; on no meds.   Sepsis (HCC)    SIRS (systemic inflammatory response syndrome) (HCC) 02/20/2021   Substance-induced psychotic disorder (HCC) 03/19/2020   Tachycardia     Past Surgical History:  Procedure Laterality Date   HEMORRHOID SURGERY N/A 11/12/2015   Procedure: EXTENSIVE HEMORRHOIDECTOMY;  Surgeon: Franky Macho, MD;  Location: [facility];  Service: General;  Laterality: N/A;   Family History: History reviewed. No pertinent family history. Family Psychiatric  History: None reported Social History:  Social History   Substance and Sexual Activity  Alcohol Use Yes   Comment: last drink 10/25/2019, pt said he had 3 beers     Social History   Substance and Sexual Activity  Drug Use Yes   Types: Heroin, Methamphetamines,  Other-see comments   Comment: was using synthetic heroin daily for 2 years but stopped 2 months ago, methamphetamine daily for 3 months until June (last use 10/25/2019- "just a pinch"), "mushrooms yesterday," and "ketamine today"    Social History   Socioeconomic History   Marital status: Single    Spouse name: Not on file   Number of children: Not on file   Years of education: Not on file   Highest education level: Not on file  Occupational History   Not on file  Tobacco Use   Smoking status: Former    Packs/day: 0.50    Years: 10.00    Total pack years: 5.00    Types: Cigarettes   Smokeless tobacco: Never  Vaping Use   Vaping Use: Every day  Substance and Sexual Activity   Alcohol use: Yes    Comment: last drink 10/25/2019, pt said he had 3 beers   Drug use: Yes    Types: Heroin, Methamphetamines, Other-see comments    Comment: was using synthetic heroin daily for 2 years but stopped 2 months ago, methamphetamine daily for 3 months until June (last use 10/25/2019- "just a pinch"), "mushrooms yesterday," and "ketamine today"   Sexual activity: Never    Birth control/protection: None  Other Topics Concern   Not on file  Social History Narrative   Not on file   Social Determinants of Health   Financial Resource Strain: Not on file  Food Insecurity: No Food Insecurity (11/13/2021)   Hunger Vital Sign    Worried About Running Out of Food in the Last  Year: Never true    Ran Out of Food in the Last Year: Never true  Transportation Needs: No Transportation Needs (11/13/2021)   PRAPARE - Administrator, Civil Service (Medical): No    Lack of Transportation (Non-Medical): No  Physical Activity: Not on file  Stress: Not on file  Social Connections: Not on file   Additional Social History:                         Sleep: Fair  Appetite:  Fair  Current Medications: Current Facility-Administered Medications  Medication Dose Route Frequency Provider  Last Rate Last Admin   acetaminophen (TYLENOL) tablet 650 mg  650 mg Oral Q6H PRN Leylani Duley T, MD   650 mg at 11/13/21 0756   alum & mag hydroxide-simeth (MAALOX/MYLANTA) 200-200-20 MG/5ML suspension 30 mL  30 mL Oral Q4H PRN Vermelle Cammarata,  T, MD   30 mL at 11/14/21 0744   buprenorphine-naloxone (SUBOXONE) 8-2 mg per SL tablet 1 tablet  1 tablet Sublingual Daily Celsa Nordahl, Jackquline Denmark, MD   1 tablet at 11/14/21 0744   feeding supplement (ENSURE ENLIVE / ENSURE PLUS) liquid 237 mL  237 mL Oral BID BM Kimbly Eanes T, MD   237 mL at 11/14/21 1017   gabapentin (NEURONTIN) capsule 300 mg  300 mg Oral TID Elleah Hemsley T, MD   300 mg at 11/14/21 0744   hydrOXYzine (ATARAX) tablet 50 mg  50 mg Oral Q6H PRN Kastin Cerda T, MD   50 mg at 11/13/21 0109   magnesium hydroxide (MILK OF MAGNESIA) suspension 30 mL  30 mL Oral Daily PRN Anaelle Dunton, Jackquline Denmark, MD       multivitamin with minerals tablet 1 tablet  1 tablet Oral Daily Sarahanne Novakowski, Jackquline Denmark, MD   1 tablet at 11/14/21 0744   nicotine (NICODERM CQ - dosed in mg/24 hours) patch 21 mg  21 mg Transdermal Daily Ellison Leisure T, MD   21 mg at 11/14/21 0745   nicotine polacrilex (NICORETTE) gum 2 mg  2 mg Oral PRN Jereline Ticer T, MD   2 mg at 11/14/21 1017   OLANZapine (ZYPREXA) tablet 10 mg  10 mg Oral QHS Tristyn Pharris T, MD   10 mg at 11/13/21 2112   pantoprazole (PROTONIX) EC tablet 40 mg  40 mg Oral BID Maverick Patman T, MD   40 mg at 11/14/21 0744   potassium chloride SA (KLOR-CON M) CR tablet 40 mEq  40 mEq Oral Daily Zabella Wease, Jackquline Denmark, MD   40 mEq at 11/14/21 0744   thiamine (VITAMIN B1) tablet 100 mg  100 mg Oral Daily Camrin Gearheart T, MD   100 mg at 11/14/21 3235   Or   thiamine (VITAMIN B1) injection 100 mg  100 mg Intravenous Daily Thai Hemrick T, MD       topiramate (TOPAMAX) tablet 50 mg  50 mg Oral Daily Harlon Kutner T, MD   50 mg at 11/14/21 0744   traZODone (DESYREL) tablet 100 mg  100 mg Oral QHS PRN , Jackquline Denmark, MD   100 mg at 11/13/21 2112    Lab  Results:  Results for orders placed or performed during the hospital encounter of 11/08/21 (from the past 48 hour(s))  CBC with Differential/Platelet     Status: Abnormal   Collection Time: 11/12/21 11:50 AM  Result Value Ref Range   WBC 8.5 4.0 - 10.5 K/uL   RBC 4.60 4.22 - 5.81  MIL/uL   Hemoglobin 13.1 13.0 - 17.0 g/dL   HCT 81.8 (L) 29.9 - 37.1 %   MCV 80.4 80.0 - 100.0 fL   MCH 28.5 26.0 - 34.0 pg   MCHC 35.4 30.0 - 36.0 g/dL   RDW 69.6 78.9 - 38.1 %   Platelets 298 150 - 400 K/uL   nRBC 0.0 0.0 - 0.2 %   Neutrophils Relative % 76 %   Neutro Abs 6.4 1.7 - 7.7 K/uL   Lymphocytes Relative 17 %   Lymphs Abs 1.4 0.7 - 4.0 K/uL   Monocytes Relative 6 %   Monocytes Absolute 0.5 0.1 - 1.0 K/uL   Eosinophils Relative 1 %   Eosinophils Absolute 0.1 0.0 - 0.5 K/uL   Basophils Relative 0 %   Basophils Absolute 0.0 0.0 - 0.1 K/uL   Immature Granulocytes 0 %   Abs Immature Granulocytes 0.01 0.00 - 0.07 K/uL    Comment: Performed at [facility], [address] [place], [place] [address]  SARS Coronavirus 2 by RT PCR (hospital order, performed in [facility] hospital lab) *cepheid single result test*     Status: None   Collection Time: 11/12/21 12:45 PM  Result Value Ref Range   SARS Coronavirus 2 by RT PCR NEGATIVE NEGATIVE    Comment: (NOTE) SARS-CoV-2 target nucleic acids are NOT DETECTED.  The SARS-CoV-2 RNA is generally detectable in upper and lower respiratory specimens during the acute phase of infection. The lowest concentration of SARS-CoV-2 viral copies this assay can detect is 250 copies / mL. A negative result does not preclude SARS-CoV-2 infection and should not be used as the sole basis for treatment or other patient management decisions.  A negative result may occur with improper specimen collection / handling, submission of specimen other than nasopharyngeal swab, presence of viral mutation(s) within the areas targeted by this assay, and inadequate number of viral  copies (<250 copies / mL). A negative result must be combined with clinical observations, patient history, and epidemiological information.  Fact Sheet for Patients:   RoadLapTop.co.za  Fact Sheet for Healthcare Providers: http://kim-miller.com/  This test is not yet approved or  cleared by the [place] FDA and has been authorized for detection and/or diagnosis of SARS-CoV-2 by FDA under an Emergency Use Authorization (EUA).  This EUA will remain in effect (meaning this test can be used) for the duration of the COVID-19 declaration under Section 564(b)(1) of the Act, 21 U.S.C. section 360bbb-3(b)(1), unless the authorization is terminated or revoked sooner.  Performed at [name], [address] [place], [place] [address]     Blood Alcohol level:  Lab Results  Component Value Date   [facility] <10 02/20/2021   ETH <10 03/19/2020    Metabolic Disorder Labs: Lab Results  Component Value Date   HGBA1C 5.2 10/27/2019   MPG 102.54 10/27/2019   Lab Results  Component Value Date   PROLACTIN 30.2 (H) 10/27/2019   No results found for: "CHOL", "TRIG", "HDL", "CHOLHDL", "VLDL", "LDLCALC"  Physical Findings: AIMS:  , ,  ,  ,    CIWA:    COWS:     Musculoskeletal: Strength & Muscle Tone: within normal limits Gait & Station: normal Patient leans: N/A  Psychiatric Specialty Exam:  Presentation  General Appearance:  Appropriate for Environment; Casual; Fairly Groomed  Eye Contact: Fair  Speech: Clear and Coherent; Normal Rate  Speech Volume: Normal  Handedness: Right   Mood and Affect  Mood: Anxious; Dysphoric  Affect: Congruent;  Constricted   Thought Process  Thought Processes: Coherent; Disorganized  Descriptions of Associations:Loose  Orientation:Partial  Thought Content:Illogical; Scattered  History of Schizophrenia/Schizoaffective disorder:Yes  Duration of Psychotic Symptoms:Greater than six  months  Hallucinations:No data recorded Ideas of Reference:Delusions  Suicidal Thoughts:No data recorded Homicidal Thoughts:No data recorded  Sensorium  Memory: Immediate Fair  Judgment: Impaired  Insight: Lacking   Executive Functions  Concentration: Fair  Attention Span: Fair  Recall: Poor  Fund of Knowledge: Fair  Language: Fair   Psychomotor Activity  Psychomotor Activity:No data recorded  Assets  Assets: Communication Skills; Physical Health; Social Support   Sleep  Sleep:No data recorded   Physical Exam: Physical Exam Vitals and nursing note reviewed.  Constitutional:      Appearance: Normal appearance.  HENT:     Head: Normocephalic and atraumatic.     Mouth/Throat:     Pharynx: Oropharynx is clear.  Eyes:     Pupils: Pupils are equal, round, and reactive to light.  Cardiovascular:     Rate and Rhythm: Normal rate and regular rhythm.  Pulmonary:     Effort: Pulmonary effort is normal.     Breath sounds: Normal breath sounds.  Abdominal:     General: Abdomen is flat.     Palpations: Abdomen is soft.  Musculoskeletal:        General: Normal range of motion.  Skin:    General: Skin is warm and dry.  Neurological:     General: No focal deficit present.     Mental Status: He is alert. Mental status is at baseline.  Psychiatric:        Attention and Perception: Attention normal.        Mood and Affect: Mood is anxious.        Speech: Speech normal.        Behavior: Behavior normal.        Thought Content: Thought content normal.        Cognition and Memory: Cognition normal.        Judgment: Judgment normal.    Review of Systems  Constitutional: Negative.   HENT: Negative.    Eyes: Negative.   Respiratory: Negative.    Cardiovascular: Negative.   Gastrointestinal: Negative.   Musculoskeletal: Negative.   Skin: Negative.   Neurological: Negative.   Psychiatric/Behavioral: Negative.     Blood pressure 121/80, pulse 99,  temperature (!) 97.5 F (36.4 C), temperature source Oral, resp. rate 18, height 5\' 5"  (1.651 m), weight 63.5 kg, SpO2 100 %. Body mass index is 23.3 kg/m.   Treatment Plan Summary: Medication management and Plan explained to patient that ADHD would be a diagnosis he would need to address in the future once acute behavioral and substance issues were under better control.  Some psychoeducation provided.  Reviewed current physical symptoms which are much improved.  Denies any current hallucinations.  Not acting out or aggressive here on the unit.  No change to medicine otherwise for today.  Supportive counseling and encouragement.  Possible length of stay 1 to 2 days.  Alethia Berthold, MD 11/14/2021, 11:38 AM
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
## 2021-11-15 MED ORDER — LORAZEPAM 1 MG PO TABS
1.0000 mg | ORAL_TABLET | Freq: Four times a day (QID) | ORAL | Status: AC
  Administered 2021-11-15 – 2021-11-17 (×8): 1 mg via ORAL
  Filled 2021-11-15 (×9): qty 1

## 2021-11-15 MED ORDER — OLANZAPINE 5 MG PO TABS
15.0000 mg | ORAL_TABLET | Freq: Every day | ORAL | Status: DC
  Administered 2021-11-15 – 2021-11-16 (×2): 15 mg via ORAL
  Filled 2021-11-15 (×2): qty 1

## 2021-11-15 NOTE — Progress Notes (Signed)
Recreation Therapy Notes  INPATIENT RECREATION TR PLAN  Patient Details Name: Ronald Hoffman MRN: [id number] DOB: Aug 12, 1980 Today's Date: 11/15/2021  Rec Therapy Plan Is patient appropriate for Therapeutic Recreation?: Yes Treatment times per week: at least 3 Estimated Length of Stay: 5-7 days TR Treatment/Interventions: Group participation (Comment)  Discharge Criteria Pt will be discharged from therapy if:: Discharged Treatment plan/goals/alternatives discussed and agreed upon by:: Patient/family  Discharge Summary     Ronald Hoffman 11/15/2021, 3:38 PM

## 2021-11-15 NOTE — Group Note (Signed)
[facility] LCSW Group Therapy Note   Group Date: 11/15/2021 Start Time: 1300 End Time: 1400  Type of Therapy and Topic:  Group Therapy:  Feelings around Relapse and Recovery  Participation Level:  Did Not Attend   Mood:  Description of Group:    Patients in this group will discuss emotions they experience before and after a relapse. They will process how experiencing these feelings, or avoidance of experiencing them, relates to having a relapse. Facilitator will guide patients to explore emotions they have related to recovery. Patients will be encouraged to process which emotions are more powerful. They will be guided to discuss the emotional reaction significant others in their lives may have to patients' relapse or recovery. Patients will be assisted in exploring ways to respond to the emotions of others without this contributing to a relapse.  Therapeutic Goals: Patient will identify two or more emotions that lead to relapse for them:  Patient will identify two emotions that result when they relapse:  Patient will identify two emotions related to recovery:  Patient will demonstrate ability to communicate their needs through discussion and/or role plays.   Summary of Patient Progress:   Group not held due to complex discharge planning.    Therapeutic Modalities:   Cognitive Behavioral Therapy Solution-Focused Therapy Assertiveness Training Relapse Prevention Therapy   [name], LCSW

## 2021-11-15 NOTE — Plan of Care (Signed)
D- Patient alert and oriented. Patient presented in a pleasant mood on assessment, slightly demanding and needy, when it comes to his medication. Patient reported that he slept "fair" last night and had continued complaints of bilateral leg pain, rating it an "8/10", in which he requested PRN pain medication from this Probation officer. Patient continues to endorse hopelessness, depression, and anxiety on his self-inventory, in which he also requested PRN Vistaril to help relieve his anxiety. Patient stated that he "can't get medicated properly" here, so when he leaves, he's going to go back to his street drugs to help with pain his. Patient denied SI, HI, AVH at this time, stating "no, not yet". Per his self-inventory, patient's goal for today is to be "helpful", in which he will "sleep", in order to do so.  A- Scheduled medications administered to patient, per MD orders. Support and encouragement provided.  Routine safety checks conducted every 15 minutes.  Patient informed to notify staff with problems or concerns.  R- No adverse drug reactions noted. Patient contracts for safety at this time. Patient compliant with medications and treatment plan. Patient receptive, calm, and cooperative. Patient interacts well with others on the unit.  Patient remains safe at this time.  Problem: Education: Goal: Knowledge of General Education information will improve Description: Including pain rating scale, medication(s)/side effects and non-pharmacologic comfort measures Outcome: Progressing   Problem: Health Behavior/Discharge Planning: Goal: Ability to manage health-related needs will improve Outcome: Progressing   Problem: Clinical Measurements: Goal: Ability to maintain clinical measurements within normal limits will improve Outcome: Progressing Goal: Will remain free from infection Outcome: Progressing Goal: Diagnostic test results will improve Outcome: Progressing Goal: Respiratory complications will  improve Outcome: Progressing Goal: Cardiovascular complication will be avoided Outcome: Progressing   Problem: Activity: Goal: Risk for activity intolerance will decrease Outcome: Progressing   Problem: Nutrition: Goal: Adequate nutrition will be maintained Outcome: Progressing   Problem: Coping: Goal: Level of anxiety will decrease Outcome: Progressing   Problem: Elimination: Goal: Will not experience complications related to bowel motility Outcome: Progressing Goal: Will not experience complications related to urinary retention Outcome: Progressing   Problem: Pain Managment: Goal: General experience of comfort will improve Outcome: Progressing   Problem: Safety: Goal: Ability to remain free from injury will improve Outcome: Progressing   Problem: Skin Integrity: Goal: Risk for impaired skin integrity will decrease Outcome: Progressing

## 2021-11-15 NOTE — B[facility] Suicide Risk Assessment (Signed)
Ronald Hoffman INPATIENT:  Family/Significant Other Suicide Prevention Education  Suicide Prevention Education:  Contact Attempts: mother, Ronald Hoffman, [phone] has been identified by the patient as the family member/significant other with whom the patient will be residing, and identified as the person(s) who will aid the patient in the event of a mental health crisis.  With written consent from the patient, two attempts were made to provide suicide prevention education, prior to and/or following the patient's discharge.  We were unsuccessful in providing suicide prevention education.  A suicide education pamphlet was given to the patient to share with family/significant other.  Date and time of first attempt: 11/15/2021 at 3:10PM Date and time of second attempt: Second attempt is needed.  CSW unable to leave HIPAA compliant voicemail at this time.  [name] 11/15/2021, 3:09 PM
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
## 2021-11-15 NOTE — Plan of Care (Signed)
Pt endorses anxiety/depression at this time. Pt denies SI/HI/AVH or pain at this time however at one point stated he was SI and that he could not tell this writer his plan because that would ruin the Engineer, manufacturing. Pt educated that if he was unable to contract for safety he would be placed on a 1:1. Pt educated on what a 1:1 consisted of. Pt then became very defensive stating what are you talking about, I never said I was going to hurt myself. Pt educated on the seriousness of his behavior. Pt left medication room irritable. Pt is not calm but is cooperative with medications. Pt is medication compliant. Pt provided with support and encouragement. Pt monitored q15 minutes for safety per unit policy. Plan of care ongoing.    Pt voiced irritation about not being able to receive his scheduled Ativan sooner, asking if he was going to have to stay up for it. This Probation officer informed the pt of the time it would be available and that it would be the soonest the medication could be received. Pt stated that's stupid.   Pt was also being disruptive at the being of the shift. He and another pt were being loud and yelling. When this writer went to assess the situation and asked if everything and everyone was okay this pt stated why are we the only ones up? Why are all the other pts walking around here like zombies or sleeping all day. This Probation officer then stated to the pt that he could lay down and take a nap to if he would like. Pt stated I can't sleep, why don't you give me a shot. This Probation officer stated I can not just give you a shot, you do not have shots. The pt replied can't you just make one up. This Probation officer stated no, I can not, that is not how it works. Security then went down later and had a conversation with this pt. Pt did stop yelling and behaved as expected.    Problem: Education: Goal: Knowledge of General Education information will improve Description: Including pain rating scale, medication(s)/side effects and  non-pharmacologic comfort measures Outcome: Not Progressing   Problem: Coping: Goal: Level of anxiety will decrease Outcome: Not Progressing

## 2021-11-15 NOTE — B[facility] Group Notes (Signed)
[place] Group Notes:  (Nursing/MHT/Case Management/Adjunct)  Date:  11/15/2021  Time:  8:32 PM  Type of Therapy:   Wrap up  Participation Level:  Active  Participation Quality:  Appropriate  Affect:  Appropriate  Cognitive:  Alert  Insight:  Good  Engagement in Group:  Engaged and his goal was to go outside today but didn't have outside time.  Modes of Intervention:  Support  Summary of Progress/Problems:  Ronald Hoffman 11/15/2021, 8:32 PM

## 2021-11-15 NOTE — Progress Notes (Signed)
Recreation Therapy Notes   Date: 11/15/2021  Time: 11:00 am   Location: Craft room    Behavioral response: Appropriate  Intervention Topic:  Stress Management    Discussion/Intervention:  Group content on today was focused on stress. The group defined stress and way to cope with stress. Participants expressed how they know when they are stresses out. Individuals described the different ways they have to cope with stress. The group stated reasons why it is important to cope with stress. Patient explained what good stress is and some examples. The group participated in the intervention "Stress Management". Individuals were separated into two group and answered questions related to stress. Clinical Observations/Feedback: Patient came to group and was focused on what peers and staff had to say about stress. He defined stress as life and pain. Participant explained that he manages his stress by doing hobbies and learning skills. Individual was social with peers and staff while participating in the intervention.    Verline Kong LRT/CTRS         Kirsta Probert 11/15/2021 12:30 PM

## 2021-11-15 NOTE — Progress Notes (Signed)
Recreation Therapy Notes  INPATIENT RECREATION THERAPY ASSESSMENT  Patient Details Name: Ronald Hoffman MRN: [id number] DOB: [birth year] Today's Date: 11/15/2021       Information Obtained From: Patient  Able to Participate in Assessment/Interview: Yes  Patient Presentation: Responsive  Reason for Admission (Per Patient): Active Symptoms  Patient Stressors:    Coping Skills:   Substance Abuse, Prayer, Talk  Leisure Interests (2+):  Sports - Basketball, Sports - Racing, Therapist, music - Fish farm manager)  Frequency of Recreation/Participation: Monthly  Awareness of Community Resources:  Yes  Community Resources:  [employer]  Current Use: No  If no, Barriers?: Museum/gallery curator  Expressed Interest in [place]: Yes  County of Residence:  Therapist, music  Patient Main Form of Transportation: Musician  Patient Strengths:  alot  Patient Identified Areas of Improvement:  N/A  Patient Goal for Hospitalization:  My anxiety  Current SI (including self-harm):  No  Current HI:  No  Current AVH: No  Staff Intervention Plan: Group Attendance, Collaborate with Interdisciplinary Treatment Team  Consent to Intern Participation: N/A  Denym Rahimi 11/15/2021, 3:37 PM

## 2021-11-15 NOTE — Progress Notes (Signed)
[facility] MD Progress Note  11/15/2021 10:49 AM Ronald Hoffman  MRN:  [id number] Subjective: Patient seen for follow-up.  Patient came to see me today and he is still feeling very anxious.  He does have a visible tremor.  Although his vital signs are stable he looks nervous and jittery.  Reportedly he also revealed some psychotic symptoms talking with the liaison from [facility] today.  Patient certainly seems in more distress today than I would have expected a few days in.  May be having extended withdrawal symptoms from either opiates or benzodiazepines. Principal Problem: Schizophrenia (HCC) Diagnosis: Principal Problem:   Schizophrenia (HCC) Active Problems:   Substance-induced psychotic disorder (HCC)   Alcohol abuse   Cocaine abuse (HCC)  Total Time spent with patient: 30 minutes  Past Psychiatric History: Past history of polysubstance abuse psychotic symptoms of uncertain etiology  Past Medical History:  Past Medical History:  Diagnosis Date   Acute psychosis (HCC) 10/26/2019   Anxiety    Delusional disorder (HCC) 10/25/2019   Drug abuse (HCC) 03/19/2020   Hyperthyroidism    Lactic acidosis 02/21/2021   Seizures (HCC)    stress related; no more seizures since first one 10 years ago; on no meds.   Sepsis (HCC)    SIRS (systemic inflammatory response syndrome) (HCC) 02/20/2021   Substance-induced psychotic disorder (HCC) 03/19/2020   Tachycardia     Past Surgical History:  Procedure Laterality Date   HEMORRHOID SURGERY N/A 11/12/2015   Procedure: EXTENSIVE HEMORRHOIDECTOMY;  Surgeon: Franky Macho, MD;  Location: [facility];  Service: General;  Laterality: N/A;   Family History: History reviewed. No pertinent family history. Family Psychiatric  History: See previous Social History:  Social History   Substance and Sexual Activity  Alcohol Use Yes   Comment: last drink 10/25/2019, pt said he had 3 beers     Social History   Substance and Sexual Activity  Drug Use Yes   Types: Heroin,  Methamphetamines, Other-see comments   Comment: was using synthetic heroin daily for 2 years but stopped 2 months ago, methamphetamine daily for 3 months until June (last use 10/25/2019- "just a pinch"), "mushrooms yesterday," and "ketamine today"    Social History   Socioeconomic History   Marital status: Single    Spouse name: Not on file   Number of children: Not on file   Years of education: Not on file   Highest education level: Not on file  Occupational History   Not on file  Tobacco Use   Smoking status: Former    Packs/day: 0.50    Years: 10.00    Total pack years: 5.00    Types: Cigarettes   Smokeless tobacco: Never  Vaping Use   Vaping Use: Every day  Substance and Sexual Activity   Alcohol use: Yes    Comment: last drink 10/25/2019, pt said he had 3 beers   Drug use: Yes    Types: Heroin, Methamphetamines, Other-see comments    Comment: was using synthetic heroin daily for 2 years but stopped 2 months ago, methamphetamine daily for 3 months until June (last use 10/25/2019- "just a pinch"), "mushrooms yesterday," and "ketamine today"   Sexual activity: Never    Birth control/protection: None  Other Topics Concern   Not on file  Social History Narrative   Not on file   Social Determinants of Health   Financial Resource Strain: Not on file  Food Insecurity: No Food Insecurity (11/13/2021)   Hunger Vital Sign    Worried  About Running Out of Food in the Last Year: Never true    Ran Out of Food in the Last Year: Never true  Transportation Needs: No Transportation Needs (11/13/2021)   PRAPARE - Hydrologist (Medical): No    Lack of Transportation (Non-Medical): No  Physical Activity: Not on file  Stress: Not on file  Social Connections: Not on file   Additional Social History:                         Sleep: Fair  Appetite:  Fair  Current Medications: Current Facility-Administered Medications  Medication Dose Route  Frequency Provider Last Rate Last Admin   acetaminophen (TYLENOL) tablet 650 mg  650 mg Oral Q6H PRN Addylin Manke,  T, MD   650 mg at 11/15/21 0814   alum & mag hydroxide-simeth (MAALOX/MYLANTA) 200-200-20 MG/5ML suspension 30 mL  30 mL Oral Q4H PRN Mialee Weyman,  T, MD   30 mL at 11/14/21 0744   buprenorphine-naloxone (SUBOXONE) 8-2 mg per SL tablet 1 tablet  1 tablet Sublingual Daily Estefanny Moler, Madie Reno, MD   1 tablet at 11/15/21 0813   feeding supplement (ENSURE ENLIVE / ENSURE PLUS) liquid 237 mL  237 mL Oral BID BM Treysen Sudbeck T, MD   237 mL at 11/15/21 1005   gabapentin (NEURONTIN) capsule 300 mg  300 mg Oral TID Yuleimy Kretz,  T, MD   300 mg at 11/15/21 0813   hydrOXYzine (ATARAX) tablet 50 mg  50 mg Oral Q6H PRN Aqsa Sensabaugh T, MD   50 mg at 11/15/21 0813   LORazepam (ATIVAN) tablet 1 mg  1 mg Oral Q6H Lizzete Gough T, MD       magnesium hydroxide (MILK OF MAGNESIA) suspension 30 mL  30 mL Oral Daily PRN Alcario Tinkey, Madie Reno, MD       multivitamin with minerals tablet 1 tablet  1 tablet Oral Daily Nyisha Clippard, Madie Reno, MD   1 tablet at 11/15/21 0813   nicotine (NICODERM CQ - dosed in mg/24 hours) patch 21 mg  21 mg Transdermal Daily Ambar Raphael, Madie Reno, MD   21 mg at 11/15/21 7425   nicotine polacrilex (NICORETTE) gum 2 mg  2 mg Oral PRN Sukhman Kocher T, MD   2 mg at 11/14/21 1723   OLANZapine (ZYPREXA) tablet 15 mg  15 mg Oral QHS Juriel Cid T, MD       pantoprazole (PROTONIX) EC tablet 40 mg  40 mg Oral BID Gazelle Towe T, MD   40 mg at 11/15/21 0813   potassium chloride SA (KLOR-CON M) CR tablet 40 mEq  40 mEq Oral Daily , Madie Reno, MD   40 mEq at 11/15/21 0813   thiamine (VITAMIN B1) tablet 100 mg  100 mg Oral Daily Jaydin Jalomo T, MD   100 mg at 11/15/21 9563   Or   thiamine (VITAMIN B1) injection 100 mg  100 mg Intravenous Daily , Madie Reno, MD       traZODone (DESYREL) tablet 100 mg  100 mg Oral QHS PRN , Madie Reno, MD   100 mg at 11/14/21 2114    Lab Results: No results found for  this or any previous visit (from the past 48 hour(s)).  Blood Alcohol level:  Lab Results  Component Value Date   [facility] <10 02/20/2021   ETH <10 87/56/4332    Metabolic Disorder Labs: Lab Results  Component Value Date   HGBA1C 5.2 10/27/2019  MPG 102.54 10/27/2019   Lab Results  Component Value Date   PROLACTIN 30.2 (H) 10/27/2019   No results found for: "CHOL", "TRIG", "HDL", "CHOLHDL", "VLDL", "LDLCALC"  Physical Findings: AIMS:  , ,  ,  ,    CIWA:    COWS:     Musculoskeletal: Strength & Muscle Tone: within normal limits Gait & Station: normal Patient leans: N/A  Psychiatric Specialty Exam:  Presentation  General Appearance:  Appropriate for Environment; Casual; Fairly Groomed  Eye Contact: Fair  Speech: Clear and Coherent; Normal Rate  Speech Volume: Normal  Handedness: Right   Mood and Affect  Mood: Anxious; Dysphoric  Affect: Congruent; Constricted   Thought Process  Thought Processes: Coherent; Disorganized  Descriptions of Associations:Loose  Orientation:Partial  Thought Content:Illogical; Scattered  History of Schizophrenia/Schizoaffective disorder:Yes  Duration of Psychotic Symptoms:Greater than six months  Hallucinations:No data recorded Ideas of Reference:Delusions  Suicidal Thoughts:No data recorded Homicidal Thoughts:No data recorded  Sensorium  Memory: Immediate Fair  Judgment: Impaired  Insight: Lacking   Executive Functions  Concentration: Fair  Attention Span: Fair  Recall: Poor  Fund of Knowledge: Fair  Language: Fair   Psychomotor Activity  Psychomotor Activity:No data recorded  Assets  Assets: Communication Skills; Physical Health; Social Support   Sleep  Sleep:No data recorded   Physical Exam: Physical Exam Vitals and nursing note reviewed.  Constitutional:      Appearance: Normal appearance.  HENT:     Head: Normocephalic and atraumatic.     Mouth/Throat:     Pharynx:  Oropharynx is clear.  Eyes:     Pupils: Pupils are equal, round, and reactive to light.  Cardiovascular:     Rate and Rhythm: Normal rate and regular rhythm.  Pulmonary:     Effort: Pulmonary effort is normal.     Breath sounds: Normal breath sounds.  Abdominal:     General: Abdomen is flat.     Palpations: Abdomen is soft.  Musculoskeletal:        General: Normal range of motion.  Skin:    General: Skin is warm and dry.  Neurological:     General: No focal deficit present.     Mental Status: He is alert. Mental status is at baseline.     Motor: Tremor present.  Psychiatric:        Attention and Perception: Attention normal.        Mood and Affect: Mood is anxious. Affect is blunt.        Speech: Speech normal.        Behavior: Behavior is slowed.        Thought Content: Thought content is delusional.    Review of Systems  Constitutional: Negative.   HENT: Negative.    Eyes: Negative.   Respiratory: Negative.    Cardiovascular: Negative.   Gastrointestinal: Negative.   Musculoskeletal: Negative.   Skin: Negative.   Neurological: Negative.   Psychiatric/Behavioral:  Positive for hallucinations. Negative for depression, substance abuse and suicidal ideas. The patient is nervous/anxious.    Blood pressure 124/87, pulse 98, temperature 97.8 F (36.6 C), temperature source Oral, resp. rate 18, height 5\' 5"  (1.651 m), weight 63.5 kg, SpO2 100 %. Body mass index is 23.3 kg/m.   Treatment Plan Summary: Medication management and Plan he is on Suboxone which probably should be adequate for most of the opiate withdrawal but I am going to add 2 days of standing Ativan for what I suspect may be more extended benzodiazepine withdrawal.  Also  increased antipsychotic to 15 mg at night.  Reassured patient that we will try to work with him on getting him safe and stable prior to discharge planning  Mordecai Rasmussen, MD 11/15/2021, 10:49 AM

## 2021-11-16 MED ORDER — METHOCARBAMOL 500 MG PO TABS
500.0000 mg | ORAL_TABLET | Freq: Three times a day (TID) | ORAL | Status: DC
  Administered 2021-11-16 – 2021-11-20 (×12): 500 mg via ORAL
  Filled 2021-11-16 (×12): qty 1

## 2021-11-16 NOTE — Plan of Care (Addendum)
Patient alert and labile on rounds , patient goes from calmly playing cars with other patients to yelling curse words at staff.  Patient is medication complaint but is med seeking and argumentative with staff about what is ordered and when it is ordered.  Patient denies AVH at this time.  Patient states he is anxious and depressed about being here because he wants to get out and get a job.  Patient denies wanting to hurt himself or anyone else.  Patient medicated for complaint of generalized pain with some relief.  Patient can contract for safety.  Q 15 minute rounds in progress, will continue to monitor.  Problem: Education: Goal: Knowledge of General Education information will improve Description: Including pain rating scale, medication(s)/side effects and non-pharmacologic comfort measures Outcome: Progressing   Problem: Safety: Goal: Ability to remain free from injury will improve Outcome: Progressing

## 2021-11-16 NOTE — Progress Notes (Signed)
[facility] MD Progress Note  11/16/2021 12:04 PM Ronald Hoffman  MRN:  [id number] Subjective: Patient seen and chart reviewed.  He looks better than he did before in terms of being better groomed and less shaky all over.  He still has a slight tremor in his hands but unless he holds them out it is not visible.  Denies suicidal or homicidal thoughts.  Denied hallucinations until we got to talking about it and then he started talking about visions and dreams that he have.  Sort of odd type of thinking.  Not frankly bizarre but just a little off.  Plans for the future seem a little weird.  Did not make any threats or hostile statements though. Principal Problem: Schizophrenia ([place]) Diagnosis: Principal Problem:   Schizophrenia ([place]) Active Problems:   Substance-induced psychotic disorder ([place])   Alcohol abuse   Cocaine abuse ([place])  Total Time spent with patient: 30 minutes  Past Psychiatric History: Past psychiatric history of recurrent substance use as primary issue  Past Medical History:  Past Medical History:  Diagnosis Date   Acute psychosis ([place]) 10/26/2019   Anxiety    Delusional disorder ([place]) 10/25/2019   Drug abuse ([place]) 03/19/2020   Hyperthyroidism    Lactic acidosis 02/21/2021   Seizures ([place])    stress related; no more seizures since first one 10 years ago; on no meds.   Sepsis ([place])    SIRS (systemic inflammatory response syndrome) ([place]) 02/20/2021   Substance-induced psychotic disorder ([place]) 03/19/2020   Tachycardia     Past Surgical History:  Procedure Laterality Date   HEMORRHOID SURGERY N/A 11/12/2015   Procedure: EXTENSIVE HEMORRHOIDECTOMY;  Surgeon: Aviva Signs, MD;  Location: [facility];  Service: General;  Laterality: N/A;   Family History: History reviewed. No pertinent family history. Family Psychiatric  History: See previous Social History:  Social History   Substance and Sexual Activity  Alcohol Use Yes   Comment: last drink 10/25/2019, pt said he had 3 beers      Social History   Substance and Sexual Activity  Drug Use Yes   Types: Heroin, Methamphetamines, Other-see comments   Comment: was using synthetic heroin daily for 2 years but stopped 2 months ago, methamphetamine daily for 3 months until June (last use 10/25/2019- "just a pinch"), "mushrooms yesterday," and "ketamine today"    Social History   Socioeconomic History   Marital status: Single    Spouse name: Not on file   Number of children: Not on file   Years of education: Not on file   Highest education level: Not on file  Occupational History   Not on file  Tobacco Use   Smoking status: Former    Packs/day: 0.50    Years: 10.00    Total pack years: 5.00    Types: Cigarettes   Smokeless tobacco: Never  Vaping Use   Vaping Use: Every day  Substance and Sexual Activity   Alcohol use: Yes    Comment: last drink 10/25/2019, pt said he had 3 beers   Drug use: Yes    Types: Heroin, Methamphetamines, Other-see comments    Comment: was using synthetic heroin daily for 2 years but stopped 2 months ago, methamphetamine daily for 3 months until June (last use 10/25/2019- "just a pinch"), "mushrooms yesterday," and "ketamine today"   Sexual activity: Never    Birth control/protection: None  Other Topics Concern   Not on file  Social History Narrative   Not on file   Social Determinants  of Health   Financial Resource Strain: Not on file  Food Insecurity: No Food Insecurity (11/13/2021)   Hunger Vital Sign    Worried About Running Out of Food in the Last Year: Never true    Ran Out of Food in the Last Year: Never true  Transportation Needs: No Transportation Needs (11/13/2021)   PRAPARE - Administrator, Civil Service (Medical): No    Lack of Transportation (Non-Medical): No  Physical Activity: Not on file  Stress: Not on file  Social Connections: Not on file   Additional Social History:                         Sleep: Fair  Appetite:  Fair  Current  Medications: Current Facility-Administered Medications  Medication Dose Route Frequency Provider Last Rate Last Admin   acetaminophen (TYLENOL) tablet 650 mg  650 mg Oral Q6H PRN Virgie Chery,  T, MD   650 mg at 11/15/21 1701   alum & mag hydroxide-simeth (MAALOX/MYLANTA) 200-200-20 MG/5ML suspension 30 mL  30 mL Oral Q4H PRN Anaiz Qazi,  T, MD   30 mL at 11/14/21 0744   buprenorphine-naloxone (SUBOXONE) 8-2 mg per SL tablet 1 tablet  1 tablet Sublingual Daily Jalen Oberry, Jackquline Denmark, MD   1 tablet at 11/16/21 0820   feeding supplement (ENSURE ENLIVE / ENSURE PLUS) liquid 237 mL  237 mL Oral BID BM Drago Hammonds T, MD   237 mL at 11/16/21 1105   gabapentin (NEURONTIN) capsule 300 mg  300 mg Oral TID Sawsan Riggio T, MD   300 mg at 11/16/21 0820   hydrOXYzine (ATARAX) tablet 50 mg  50 mg Oral Q6H PRN Tekla Malachowski T, MD   50 mg at 11/15/21 2105   LORazepam (ATIVAN) tablet 1 mg  1 mg Oral Q6H Jacquiline Zurcher T, MD   1 mg at 11/16/21 5686   magnesium hydroxide (MILK OF MAGNESIA) suspension 30 mL  30 mL Oral Daily PRN Leandria Thier, Jackquline Denmark, MD       multivitamin with minerals tablet 1 tablet  1 tablet Oral Daily Antinette Keough, Jackquline Denmark, MD   1 tablet at 11/16/21 0820   nicotine (NICODERM CQ - dosed in mg/24 hours) patch 21 mg  21 mg Transdermal Daily Monti Villers, Jackquline Denmark, MD   21 mg at 11/15/21 1683   nicotine polacrilex (NICORETTE) gum 2 mg  2 mg Oral PRN Aniesha Haughn, Jackquline Denmark, MD   2 mg at 11/15/21 2107   OLANZapine (ZYPREXA) tablet 15 mg  15 mg Oral QHS Shahab Polhamus T, MD   15 mg at 11/15/21 2105   pantoprazole (PROTONIX) EC tablet 40 mg  40 mg Oral BID Carlyn Lemke T, MD   40 mg at 11/16/21 0820   potassium chloride SA (KLOR-CON M) CR tablet 40 mEq  40 mEq Oral Daily , Jackquline Denmark, MD   40 mEq at 11/16/21 0820   thiamine (VITAMIN B1) tablet 100 mg  100 mg Oral Daily Jacorie Ernsberger T, MD   100 mg at 11/16/21 0820   Or   thiamine (VITAMIN B1) injection 100 mg  100 mg Intravenous Daily Enzio Buchler T, MD       traZODone (DESYREL)  tablet 100 mg  100 mg Oral QHS PRN , Jackquline Denmark, MD   100 mg at 11/15/21 2233    Lab Results: No results found for this or any previous visit (from the past 48 hour(s)).  Blood Alcohol level:  Lab Results  Component Value Date   ETH <10 02/20/2021   ETH <10 03/19/2020    Metabolic Disorder Labs: Lab Results  Component Value Date   HGBA1C 5.2 10/27/2019   MPG 102.54 10/27/2019   Lab Results  Component Value Date   PROLACTIN 30.2 (H) 10/27/2019   No results found for: "CHOL", "TRIG", "HDL", "CHOLHDL", "VLDL", "LDLCALC"  Physical Findings: AIMS:  , ,  ,  ,    CIWA:    COWS:     Musculoskeletal: Strength & Muscle Tone: within normal limits Gait & Station: normal Patient leans: N/A  Psychiatric Specialty Exam:  Presentation  General Appearance:  Appropriate for Environment; Casual; Fairly Groomed  Eye Contact: Fair  Speech: Clear and Coherent; Normal Rate  Speech Volume: Normal  Handedness: Right   Mood and Affect  Mood: Anxious; Dysphoric  Affect: Congruent; Constricted   Thought Process  Thought Processes: Coherent; Disorganized  Descriptions of Associations:Loose  Orientation:Partial  Thought Content:Illogical; Scattered  History of Schizophrenia/Schizoaffective disorder:Yes  Duration of Psychotic Symptoms:Greater than six months  Hallucinations:No data recorded Ideas of Reference:Delusions  Suicidal Thoughts:No data recorded Homicidal Thoughts:No data recorded  Sensorium  Memory: Immediate Fair  Judgment: Impaired  Insight: Lacking   Executive Functions  Concentration: Fair  Attention Span: Fair  Recall: Poor  Fund of Knowledge: Fair  Language: Fair   Psychomotor Activity  Psychomotor Activity:No data recorded  Assets  Assets: Communication Skills; Physical Health; Social Support   Sleep  Sleep:No data recorded   Physical Exam: Physical Exam Vitals and nursing note reviewed.  Constitutional:       Appearance: Normal appearance.  HENT:     Head: Normocephalic and atraumatic.     Mouth/Throat:     Pharynx: Oropharynx is clear.  Eyes:     Pupils: Pupils are equal, round, and reactive to light.  Cardiovascular:     Rate and Rhythm: Normal rate and regular rhythm.  Pulmonary:     Effort: Pulmonary effort is normal.     Breath sounds: Normal breath sounds.  Abdominal:     General: Abdomen is flat.     Palpations: Abdomen is soft.  Musculoskeletal:        General: Normal range of motion.  Skin:    General: Skin is warm and dry.  Neurological:     General: No focal deficit present.     Mental Status: He is alert. Mental status is at baseline.  Psychiatric:        Attention and Perception: Attention normal.        Mood and Affect: Mood normal. Affect is blunt.        Speech: Speech is tangential.        Behavior: Behavior is cooperative.        Thought Content: Thought content is delusional.    Review of Systems  Constitutional: Negative.   HENT: Negative.    Eyes: Negative.   Respiratory: Negative.    Cardiovascular: Negative.   Gastrointestinal: Negative.   Musculoskeletal: Negative.   Skin: Negative.   Neurological:  Positive for tremors.  Psychiatric/Behavioral:  Positive for hallucinations. Negative for depression, substance abuse and suicidal ideas. The patient is nervous/anxious.    Blood pressure 116/79, pulse 85, temperature 97.7 F (36.5 C), temperature source Oral, resp. rate 18, height 5\' 5"  (1.651 m), weight 63.5 kg, SpO2 100 %. Body mass index is 23.3 kg/m.   Treatment Plan Summary: Medication management and Plan seems to be doing a little better with antipsychotic and detox  medicine.  Encourage patient to be up out of bed and eating well.  No change in medicine for today  Mordecai Rasmussen, MD 11/16/2021, 12:04 PM

## 2021-11-16 NOTE — Plan of Care (Signed)
Pt reports good sleep with help of sleep medication.  Pt reports good appetite, poor concentration, and low energy.  Depression - 4/10, hopelessness 2/10, and anxiety 3/10.  Pt denies SI HI.  C/O  generalized pain this morning, declined pain medication but accepted scheduled suboxone.  Goal today is "to get healthier."    Throughout the shift Pt was mildly irritable at times yet cooperative and compliant.  Pt took all medications and followed procedures without incident.  He appeared agitated when talking on the phone to family, but was generally respectful to staff and other patients.  Pt. Socialized with other patients.  Pt received new order for muscle relaxant and says it makes him feel less tense.  Continued q15 min monitoring for safety.

## 2021-11-17 MED ORDER — OLANZAPINE 10 MG PO TABS
20.0000 mg | ORAL_TABLET | Freq: Every day | ORAL | Status: DC
  Administered 2021-11-17: 20 mg via ORAL
  Filled 2021-11-17: qty 2

## 2021-11-17 NOTE — [facility] Group Notes (Signed)
[facility] Group Notes:  (Nursing/MHT/Case Management/Adjunct)  Date:  11/17/2021  Time:  8:29 PM  Type of Therapy:   Wrap up  Participation Level:  Active  Participation Quality:  Appropriate  Affect:  Appropriate  Cognitive:  Alert  Insight:  Good  Engagement in Group:  Engaged and said goal is half done came here for treatment  Modes of Intervention:  Activity  Summary of Progress/Problems:  Nehemiah Settle 11/17/2021, 8:29 PM

## 2021-11-17 NOTE — Plan of Care (Signed)
Pt is generally calm and cooperative, denies SI, HI, AVH.  Pt states that he needs pain medication and asks for Roxi.  When offered tylenol he says "I'll pass."  Pt asked to rate pain and said "none, since you guys aint gonna give me anything."   Pt says that the muscle relaxant is helping him.  He is aware that he does not have any more ativan ordered but that vistaril is available PRN.  He declined vistaril at 5 PM med pass.  Continued q 15 minute checks for safety.

## 2021-11-17 NOTE — Progress Notes (Signed)
[facility] MD Progress Note  11/17/2021 11:13 AM Ronald Hoffman  MRN:  [id number] Subjective: Patient seen and chart reviewed.  Patient is feeling anxious today and is upset that his Ativan taper has concluded.  Complains about his tremor.  At rest his tremor is barely visible although he will exaggerate it for affect.  Patient gets irritated when I explained that Ativan is a controlled substance use for withdrawal and not a primary treatment for his symptoms.  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 Principal Problem: Schizophrenia ([place]) Diagnosis: Principal Problem:   Schizophrenia ([place]) Active Problems:   Substance-induced psychotic disorder ([place])   Alcohol abuse   Cocaine abuse ([place])  Total Time spent with patient: 30 minutes  Past Psychiatric History: Past history of severe substance abuse possible psychotic disorder  Past Medical History:  Past Medical History:  Diagnosis Date   Acute psychosis ([place]) 10/26/2019   Anxiety    Delusional disorder ([place]) 10/25/2019   Drug abuse ([place]) 03/19/2020   Hyperthyroidism    Lactic acidosis 02/21/2021   Seizures ([place])    stress related; no more seizures since first one 10 years ago; on no meds.   Sepsis ([place])    SIRS (systemic inflammatory response syndrome) ([place]) 02/20/2021   Substance-induced psychotic disorder ([place]) 03/19/2020   Tachycardia     Past Surgical History:  Procedure Laterality Date   HEMORRHOID SURGERY N/A 11/12/2015   Procedure: EXTENSIVE HEMORRHOIDECTOMY;  Surgeon: Aviva Signs, MD;  Location: [facility];  Service: General;  Laterality: N/A;   Family History: History reviewed. No pertinent family history. Family Psychiatric  History: See previous Social History:  Social History   Substance and Sexual Activity  Alcohol Use Yes   Comment: last drink 10/25/2019, pt said he had 3 beers     Social History   Substance and Sexual Activity  Drug Use Yes   Types:  Heroin, Methamphetamines, Other-see comments   Comment: was using synthetic heroin daily for 2 years but stopped 2 months ago, methamphetamine daily for 3 months until June (last use 10/25/2019- "just a pinch"), "mushrooms yesterday," and "ketamine today"    Social History   Socioeconomic History   Marital status: Single    Spouse name: Not on file   Number of children: Not on file   Years of education: Not on file   Highest education level: Not on file  Occupational History   Not on file  Tobacco Use   Smoking status: Former    Packs/day: 0.50    Years: 10.00    Total pack years: 5.00    Types: Cigarettes   Smokeless tobacco: Never  Vaping Use   Vaping Use: Every day  Substance and Sexual Activity   Alcohol use: Yes    Comment: last drink 10/25/2019, pt said he had 3 beers   Drug use: Yes    Types: Heroin, Methamphetamines, Other-see comments    Comment: was using synthetic heroin daily for 2 years but stopped 2 months ago, methamphetamine daily for 3 months until June (last use 10/25/2019- "just a pinch"), "mushrooms yesterday," and "ketamine today"   Sexual activity: Never    Birth control/protection: None  Other Topics Concern   Not on file  Social History Narrative   Not on file   Social Determinants of Health   Financial Resource Strain: Not on file  Food Insecurity: No Food Insecurity (11/13/2021)   Hunger Vital Sign  Worried About Programme researcher, broadcasting/film/video in the Last Year: Never true    Ran Out of Food in the Last Year: Never true  Transportation Needs: No Transportation Needs (11/13/2021)   PRAPARE - Administrator, Civil Service (Medical): No    Lack of Transportation (Non-Medical): No  Physical Activity: Not on file  Stress: Not on file  Social Connections: Not on file   Additional Social History:                         Sleep: Fair  Appetite:  Fair  Current Medications: Current Facility-Administered Medications  Medication Dose  Route Frequency Provider Last Rate Last Admin   acetaminophen (TYLENOL) tablet 650 mg  650 mg Oral Q6H PRN Nelle Sayed T, MD   650 mg at 11/15/21 1701   alum & mag hydroxide-simeth (MAALOX/MYLANTA) 200-200-20 MG/5ML suspension 30 mL  30 mL Oral Q4H PRN Malissia Rabbani T, MD   30 mL at 11/14/21 0744   buprenorphine-naloxone (SUBOXONE) 8-2 mg per SL tablet 1 tablet  1 tablet Sublingual Daily Edeline Greening, Jackquline Denmark, MD   1 tablet at 11/17/21 6144   feeding supplement (ENSURE ENLIVE / ENSURE PLUS) liquid 237 mL  237 mL Oral BID BM Cortnee Steinmiller T, MD   237 mL at 11/16/21 1730   gabapentin (NEURONTIN) capsule 300 mg  300 mg Oral TID Prabhav Faulkenberry T, MD   300 mg at 11/17/21 0825   hydrOXYzine (ATARAX) tablet 50 mg  50 mg Oral Q6H PRN Yitzchok Carriger T, MD   50 mg at 11/16/21 2120   magnesium hydroxide (MILK OF MAGNESIA) suspension 30 mL  30 mL Oral Daily PRN Marqui Formby, Jackquline Denmark, MD       methocarbamol (ROBAXIN) tablet 500 mg  500 mg Oral TID Keerstin Bjelland, Jackquline Denmark, MD   500 mg at 11/17/21 3154   multivitamin with minerals tablet 1 tablet  1 tablet Oral Daily Kenyanna Grzesiak, Jackquline Denmark, MD   1 tablet at 11/17/21 0086   nicotine (NICODERM CQ - dosed in mg/24 hours) patch 21 mg  21 mg Transdermal Daily Zacchary Pompei, Jackquline Denmark, MD   21 mg at 11/15/21 7619   nicotine polacrilex (NICORETTE) gum 2 mg  2 mg Oral PRN Sibel Khurana, Jackquline Denmark, MD   2 mg at 11/15/21 2107   OLANZapine (ZYPREXA) tablet 15 mg  15 mg Oral QHS Bronx Brogden T, MD   15 mg at 11/16/21 2120   pantoprazole (PROTONIX) EC tablet 40 mg  40 mg Oral BID Olyver Hawes T, MD   40 mg at 11/17/21 0824   potassium chloride SA (KLOR-CON M) CR tablet 40 mEq  40 mEq Oral Daily Avangeline Stockburger, Jackquline Denmark, MD   40 mEq at 11/17/21 5093   thiamine (VITAMIN B1) tablet 100 mg  100 mg Oral Daily Kelii Chittum, Jackquline Denmark, MD   100 mg at 11/17/21 0824   traZODone (DESYREL) tablet 100 mg  100 mg Oral QHS PRN Noeli Lavery, Jackquline Denmark, MD   100 mg at 11/16/21 2120    Lab Results: No results found for this or any previous visit (from the  past 48 hour(s)).  Blood Alcohol level:  Lab Results  Component Value Date   [facility] <10 02/20/2021   ETH <10 03/19/2020    Metabolic Disorder Labs: Lab Results  Component Value Date   HGBA1C 5.2 10/27/2019   MPG 102.54 10/27/2019   Lab Results  Component Value Date   PROLACTIN 30.2 (H)  10/27/2019   No results found for: "CHOL", "TRIG", "HDL", "CHOLHDL", "VLDL", "LDLCALC"  Physical Findings: AIMS:  , ,  ,  ,    CIWA:    COWS:     Musculoskeletal: Strength & Muscle Tone: within normal limits Gait & Station: normal Patient leans: N/A  Psychiatric Specialty Exam:  Presentation  General Appearance:  Appropriate for Environment; Casual; Fairly Groomed  Eye Contact: Fair  Speech: Clear and Coherent; Normal Rate  Speech Volume: Normal  Handedness: Right   Mood and Affect  Mood: Anxious; Dysphoric  Affect: Congruent; Constricted   Thought Process  Thought Processes: Coherent; Disorganized  Descriptions of Associations:Loose  Orientation:Partial  Thought Content:Illogical; Scattered  History of Schizophrenia/Schizoaffective disorder:Yes  Duration of Psychotic Symptoms:Greater than six months  Hallucinations:No data recorded Ideas of Reference:Delusions  Suicidal Thoughts:No data recorded Homicidal Thoughts:No data recorded  Sensorium  Memory: Immediate Fair  Judgment: Impaired  Insight: Lacking   Executive Functions  Concentration: Fair  Attention Span: Fair  Recall: Poor  Fund of Knowledge: Fair  Language: Fair   Psychomotor Activity  Psychomotor Activity:No data recorded  Assets  Assets: Communication Skills; Physical Health; Social Support   Sleep  Sleep:No data recorded   Physical Exam: Physical Exam Vitals and nursing note reviewed.  Constitutional:      Appearance: Normal appearance.  HENT:     Head: Normocephalic and atraumatic.     Mouth/Throat:     Pharynx: Oropharynx is clear.  Eyes:      Pupils: Pupils are equal, round, and reactive to light.  Cardiovascular:     Rate and Rhythm: Normal rate and regular rhythm.  Pulmonary:     Effort: Pulmonary effort is normal.     Breath sounds: Normal breath sounds.  Abdominal:     General: Abdomen is flat.     Palpations: Abdomen is soft.  Musculoskeletal:        General: Normal range of motion.  Skin:    General: Skin is warm and dry.  Neurological:     General: No focal deficit present.     Mental Status: He is alert. Mental status is at baseline.  Psychiatric:        Attention and Perception: Attention normal.        Mood and Affect: Mood is anxious.        Speech: Speech is tangential.        Behavior: Behavior is agitated. Behavior is not aggressive.        Thought Content: Thought content is delusional.    Review of Systems  Constitutional: Negative.   HENT: Negative.    Eyes: Negative.   Respiratory: Negative.    Cardiovascular: Negative.   Gastrointestinal: Negative.   Musculoskeletal: Negative.   Skin: Negative.   Neurological:  Positive for tremors.  Psychiatric/Behavioral:  Negative for depression, hallucinations, substance abuse and suicidal ideas. The patient is nervous/anxious.    Blood pressure 117/83, pulse 95, temperature 97.7 F (36.5 C), temperature source Oral, resp. rate 18, height 5\' 5"  (1.651 m), weight 63.5 kg, SpO2 100 %. Body mass index is 23.3 kg/m.   Treatment Plan Summary: Medication management and Plan continue off of the Ativan.  Patient does not have a clinical need for it at this point.  Explained this to patient although he is not accepting of it and is frustrated.  Supportive counseling encourage group attendance.  , MD 11/17/2021, 11:13 AM

## 2021-11-18 MED ORDER — GABAPENTIN 300 MG PO CAPS
600.0000 mg | ORAL_CAPSULE | Freq: Three times a day (TID) | ORAL | Status: DC
  Administered 2021-11-18 – 2021-11-20 (×7): 600 mg via ORAL
  Filled 2021-11-18 (×7): qty 2

## 2021-11-18 MED ORDER — HALOPERIDOL 5 MG PO TABS
5.0000 mg | ORAL_TABLET | Freq: Two times a day (BID) | ORAL | Status: DC
  Administered 2021-11-18: 5 mg via ORAL
  Filled 2021-11-18: qty 1

## 2021-11-18 MED ORDER — BENZTROPINE MESYLATE 1 MG PO TABS
0.5000 mg | ORAL_TABLET | Freq: Two times a day (BID) | ORAL | Status: DC
  Administered 2021-11-18 – 2021-11-20 (×4): 0.5 mg via ORAL
  Filled 2021-11-18 (×4): qty 1

## 2021-11-18 MED ORDER — QUETIAPINE FUMARATE 100 MG PO TABS
100.0000 mg | ORAL_TABLET | Freq: Every day | ORAL | Status: DC
  Administered 2021-11-18: 100 mg via ORAL
  Filled 2021-11-18: qty 1

## 2021-11-18 NOTE — Group Note (Signed)
[facility] LCSW Group Therapy Note    Group Date: 11/18/2021 Start Time: 1300 End Time: 1400  Type of Therapy and Topic:  Group Therapy:  Overcoming Obstacles  Participation Level:  [facility] PARTICIPATION LEVEL: Minimal   Description of Group:   In this group patients will be encouraged to explore what they see as obstacles to their own wellness and recovery. They will be guided to discuss their thoughts, feelings, and behaviors related to these obstacles. The group will process together ways to cope with barriers, with attention given to specific choices patients can make. Each patient will be challenged to identify changes they are motivated to make in order to overcome their obstacles. This group will be process-oriented, with patients participating in exploration of their own experiences as well as giving and receiving support and challenge from other group members.  Therapeutic Goals: 1. Patient will identify personal and current obstacles as they relate to admission. 2. Patient will identify barriers that currently interfere with their wellness or overcoming obstacles.  3. Patient will identify feelings, thought process and behaviors related to these barriers. 4. Patient will identify two changes they are willing to make to overcome these obstacles:    Summary of Patient Progress Patient was present for the entirety of the group process. He identified lack of communication and feelings of uncertainty as obstacles that he needs to overcome. As conversation went on, pt became less involved with the discussion. However, he did appear to attend to the conversation at hand.    Therapeutic Modalities:   Cognitive Behavioral Therapy Solution Focused Therapy Motivational Interviewing Relapse Prevention Therapy   Shirl Harris, LCSW

## 2021-11-18 NOTE — Progress Notes (Signed)
Patient presents with a bizarre affect, speaking latin at times to staff. Pt observed interacting appropriately with staff and peers on the unit. Pt compliant with medication administration per MD orders. Pt given education, support, and encouragement to be active in his treatment plan. Pt being monitored Q 15 minutes for safety per unit protocol, remains safe on the unit 

## 2021-11-18 NOTE — Progress Notes (Signed)
The patient was cooperative with treatment and medications, he had minimal w/d symptoms noted on shift. He was visible in the milieu interacting with peers during the evening, he appeared to rest well through out the night.

## 2021-11-18 NOTE — Progress Notes (Signed)
Patient at the nurses station requesting Robaxin. Writer reminded patient that he was administered robaxin at 8:25 am with morning meds. Writer offered patient PRN tylenol and patient stormed to his room and slammed the door. Patient back at med room complaining of body pain. Patient offered PRN Atarax to help decrease his anxiety. Writer will continue to monitor patient for anxiety and mood/behavior.

## 2021-11-18 NOTE — Plan of Care (Signed)
D- Patient alert and oriented. Patient mood is. Patient denies SI, HI, AVH, and pain at 0825 am assessment. Patient states his goal it get the right medication for treatment as he take Xanax outside of the hospital and feels that nothing is helping him.  A- Scheduled medications administered to patient, per MD orders. Support and encouragement provided.  Routine safety checks conducted every 15 minutes.  Patient informed to notify staff with problems or concerns.  R- No adverse drug reactions noted. Patient contracts for safety at this time. Patient compliant with medications and treatment plan. Patient receptive, calm, and cooperative. Patient interacts well with others on the unit.  Patient remains safe at this time.   Problem: Coping: Goal: Level of anxiety will decrease Outcome: Not Progressing   Problem: Education: Goal: Knowledge of General Education information will improve Description: Including pain rating scale, medication(s)/side effects and non-pharmacologic comfort measures Outcome: Progressing   Problem: Health Behavior/Discharge Planning: Goal: Ability to manage health-related needs will improve Outcome: Progressing   Problem: Clinical Measurements: Goal: Will remain free from infection Outcome: Progressing   Problem: Skin Integrity: Goal: Risk for impaired skin integrity will decrease Outcome: Progressing

## 2021-11-18 NOTE — BH IP Treatment Plan (Signed)
Interdisciplinary Treatment and Diagnostic Plan Update  11/18/2021 Time of Session: 08:30 Ronald Hoffman MRN: [id number]  Principal Diagnosis: Schizophrenia [facility])  Secondary Diagnoses: Principal Problem:   Schizophrenia (HCC) Active Problems:   Substance-induced psychotic disorder (HCC)   Alcohol abuse   Cocaine abuse (HCC)   Current Medications:  Current Facility-Administered Medications  Medication Dose Route Frequency Provider Last Rate Last Admin   acetaminophen (TYLENOL) tablet 650 mg  650 mg Oral Q6H PRN Clapacs, Jackquline Denmark, MD   650 mg at 11/15/21 1701   alum & mag hydroxide-simeth (MAALOX/MYLANTA) 200-200-20 MG/5ML suspension 30 mL  30 mL Oral Q4H PRN Clapacs, John T, MD   30 mL at 11/14/21 0744   buprenorphine-naloxone (SUBOXONE) 8-2 mg per SL tablet 1 tablet  1 tablet Sublingual Daily Clapacs, Jackquline Denmark, MD   1 tablet at 11/18/21 8341   feeding supplement (ENSURE ENLIVE / ENSURE PLUS) liquid 237 mL  237 mL Oral BID BM Clapacs, John T, MD   237 mL at 11/17/21 1551   gabapentin (NEURONTIN) capsule 300 mg  300 mg Oral TID Clapacs, John T, MD   300 mg at 11/18/21 0825   hydrOXYzine (ATARAX) tablet 50 mg  50 mg Oral Q6H PRN Clapacs, John T, MD   50 mg at 11/16/21 2120   magnesium hydroxide (MILK OF MAGNESIA) suspension 30 mL  30 mL Oral Daily PRN Clapacs, Jackquline Denmark, MD       methocarbamol (ROBAXIN) tablet 500 mg  500 mg Oral TID Clapacs, Jackquline Denmark, MD   500 mg at 11/18/21 9622   multivitamin with minerals tablet 1 tablet  1 tablet Oral Daily Clapacs, Jackquline Denmark, MD   1 tablet at 11/18/21 2979   nicotine (NICODERM CQ - dosed in mg/24 hours) patch 21 mg  21 mg Transdermal Daily Clapacs, Jackquline Denmark, MD   21 mg at 11/15/21 8921   nicotine polacrilex (NICORETTE) gum 2 mg  2 mg Oral PRN Clapacs, Jackquline Denmark, MD   2 mg at 11/15/21 2107   OLANZapine (ZYPREXA) tablet 20 mg  20 mg Oral QHS Clapacs, John T, MD   20 mg at 11/17/21 2125   pantoprazole (PROTONIX) EC tablet 40 mg  40 mg Oral BID Clapacs, John T, MD   40 mg  at 11/18/21 1941   potassium chloride SA (KLOR-CON M) CR tablet 40 mEq  40 mEq Oral Daily Clapacs, Jackquline Denmark, MD   40 mEq at 11/18/21 7408   thiamine (VITAMIN B1) tablet 100 mg  100 mg Oral Daily Clapacs, Jackquline Denmark, MD   100 mg at 11/18/21 1448   traZODone (DESYREL) tablet 100 mg  100 mg Oral QHS PRN Clapacs, Jackquline Denmark, MD   100 mg at 11/17/21 2125   PTA Medications: Medications Prior to Admission  Medication Sig Dispense Refill Last Dose   acetaminophen (TYLENOL) 325 MG tablet Take 2 tablets (650 mg total) by mouth every 6 (six) hours as needed for mild pain (or Fever >/= 101).      folic acid (FOLVITE) 1 MG tablet Take 1 tablet (1 mg total) by mouth daily. 30 tablet 1    gabapentin (NEURONTIN) 400 MG capsule Take 1 capsule (400 mg total) by mouth 3 (three) times daily. 90 capsule 1    LORazepam (ATIVAN) 1 MG tablet Take 1 tablet (1 mg total) by mouth every 6 (six) hours as needed for anxiety or sleep. 20 tablet 0    metoprolol tartrate (LOPRESSOR) 50 MG tablet Take 1 tablet (50  mg total) by mouth 2 (two) times daily. 60 tablet 2    Multiple Vitamin (MULTIVITAMIN WITH MINERALS) TABS tablet Take 1 tablet by mouth daily. 30 tablet 1    nicotine (NICODERM CQ - DOSED IN MG/24 HOURS) 21 mg/24hr patch Place 1 patch (21 mg total) onto the skin daily. 28 patch 0    OLANZapine (ZYPREXA) 10 MG tablet Take 1 tablet (10 mg total) by mouth at bedtime. 30 tablet 1    oxyCODONE (OXY IR/ROXICODONE) 5 MG immediate release tablet Take 1 tablet (5 mg total) by mouth every 6 (six) hours as needed for severe pain. 20 tablet 0    pantoprazole (PROTONIX) 40 MG tablet Take 1 tablet (40 mg total) by mouth 2 (two) times daily. 60 tablet 2    potassium chloride SA (KLOR-CON M) 20 MEQ tablet Take 2 tablets (40 mEq total) by mouth daily. 30 tablet 0    thiamine (VITAMIN B-1) 100 MG tablet Take 1 tablet (100 mg total) by mouth daily. 30 tablet 1    topiramate (TOPAMAX) 50 MG tablet Take 1 tablet (50 mg total) by mouth daily. 30  tablet 1     Patient Stressors:    Patient Strengths:    Treatment Modalities: Medication Management, Group therapy, Case management,  1 to 1 session with clinician, Psychoeducation, Recreational therapy.   Physician Treatment Plan for Primary Diagnosis: Schizophrenia (HCC) Long Term Goal(s): Improvement in symptoms so as ready for discharge   Short Term Goals: Ability to maintain clinical measurements within normal limits will improve Compliance with prescribed medications will improve Ability to verbalize feelings will improve Ability to disclose and discuss suicidal ideas Ability to demonstrate self-control will improve  Medication Management: Evaluate patient's response, side effects, and tolerance of medication regimen.  Therapeutic Interventions: 1 to 1 sessions, Unit Group sessions and Medication administration.  Evaluation of Outcomes: Progressing  Physician Treatment Plan for Secondary Diagnosis: Principal Problem:   Schizophrenia (HCC) Active Problems:   Substance-induced psychotic disorder (HCC)   Alcohol abuse   Cocaine abuse (HCC)  Long Term Goal(s): Improvement in symptoms so as ready for discharge   Short Term Goals: Ability to maintain clinical measurements within normal limits will improve Compliance with prescribed medications will improve Ability to verbalize feelings will improve Ability to disclose and discuss suicidal ideas Ability to demonstrate self-control will improve     Medication Management: Evaluate patient's response, side effects, and tolerance of medication regimen.  Therapeutic Interventions: 1 to 1 sessions, Unit Group sessions and Medication administration.  Evaluation of Outcomes: Progressing   RN Treatment Plan for Primary Diagnosis: Schizophrenia (HCC) Long Term Goal(s): Knowledge of disease and therapeutic regimen to maintain health will improve  Short Term Goals: Ability to remain free from injury will improve, Ability to  verbalize frustration and anger appropriately will improve, Ability to demonstrate self-control, Ability to participate in decision making will improve, Ability to verbalize feelings will improve, Ability to disclose and discuss suicidal ideas, Ability to identify and develop effective coping behaviors will improve, and Compliance with prescribed medications will improve  Medication Management: RN will administer medications as ordered by provider, will assess and evaluate patient's response and provide education to patient for prescribed medication. RN will report any adverse and/or side effects to prescribing provider.  Therapeutic Interventions: 1 on 1 counseling sessions, Psychoeducation, Medication administration, Evaluate responses to treatment, Monitor vital signs and CBGs as ordered, Perform/monitor CIWA, COWS, AIMS and Fall Risk screenings as ordered, Perform wound care treatments as ordered.  Evaluation of Outcomes: Progressing   LCSW Treatment Plan for Primary Diagnosis: Schizophrenia ([place]) Long Term Goal(s): Safe transition to appropriate next level of care at discharge, Engage patient in therapeutic group addressing interpersonal concerns.  Short Term Goals: Engage patient in aftercare planning with referrals and resources, Increase social support, Increase ability to appropriately verbalize feelings, Increase emotional regulation, Facilitate acceptance of mental health diagnosis and concerns, Facilitate patient progression through stages of change regarding substance use diagnoses and concerns, Identify triggers associated with mental health/substance abuse issues, and Increase skills for wellness and recovery  Therapeutic Interventions: Assess for all discharge needs, 1 to 1 time with Social worker, Explore available resources and support systems, Assess for adequacy in community support network, Educate family and significant other(s) on suicide prevention, Complete Psychosocial  Assessment, Interpersonal group therapy.  Evaluation of Outcomes: Progressing   Progress in Treatment: Attending groups: Yes. Participating in groups: Yes. Taking medication as prescribed: Yes. Toleration medication: Yes. Family/Significant other contact made: Yes, individual(s) contacted:  mother, Sharbel Sahagun. Patient understands diagnosis: Yes. Discussing patient identified problems/goals with staff: Yes. Medical problems stabilized or resolved: Yes. Denies suicidal/homicidal ideation: Yes. Issues/concerns per patient self-inventory: No. Other: none.  New problem(s) identified: No, Describe:  none identified. Update 11/18/21: No changes at this time.   New Short Term/Long Term Goal(s): detox, elimination of symptoms of psychosis, medication management for mood stabilization; elimination of SI thoughts; development of comprehensive mental wellness/sobriety plan. Update 11/18/21: No changes at this time.   Patient Goals:  "I need to work on my shakiness, nervousness, and my pain levels." Update 11/18/21: No changes at this time.   Discharge Plan or Barriers: CSW will assist pt with development of an appropriate aftercare/discharge plan. Update 11/18/21: No changes at this time.    Reason for Continuation of Hospitalization: Anxiety Medication stabilization Suicidal ideation Withdrawal symptoms   Estimated Length of Stay: 1-7 days Update 11/18/21: No changes at this time.  Last 3 [place] Suicide Severity Risk Score: Flowsheet Row Admission (Current) from 11/13/2021 in [place] ED to Hosp-Admission (Discharged) from 11/08/2021 in [place] ED to Hosp-Admission (Discharged) from 02/20/2021 in [place] No Risk Low Risk No Risk       Last PHQ 2/9 Scores:     No data to display          Scribe for Treatment Team: Shirl Harris, LCSW 11/18/2021 8:52 AM

## 2021-11-18 NOTE — Progress Notes (Signed)
[facility] MD Progress Note  11/18/2021 3:26 PM Ronald Hoffman  MRN:  [id number] Subjective: Follow-up 41 year old man with schizophrenia and substance abuse.  Patient has multiple complaints some of which do not even really seem to make a lot of sense.  Complains that his legs hurt in a way that feels like they are moving back and forth.  I have no idea if he is describing akathisia as he does not seem particularly restless.  Still has some strange delusions that he expresses at times although for the most part seems in touch with reality around him. Principal Problem: Schizophrenia (HCC) Diagnosis: Principal Problem:   Schizophrenia (HCC) Active Problems:   Substance-induced psychotic disorder (HCC)   Alcohol abuse   Cocaine abuse (HCC)  Total Time spent with patient: 30 minutes  Past Psychiatric History: Past history of psychotic disorder and substance abuse  Past Medical History:  Past Medical History:  Diagnosis Date   Acute psychosis (HCC) 10/26/2019   Anxiety    Delusional disorder (HCC) 10/25/2019   Drug abuse (HCC) 03/19/2020   Hyperthyroidism    Lactic acidosis 02/21/2021   Seizures (HCC)    stress related; no more seizures since first one 10 years ago; on no meds.   Sepsis (HCC)    SIRS (systemic inflammatory response syndrome) (HCC) 02/20/2021   Substance-induced psychotic disorder (HCC) 03/19/2020   Tachycardia     Past Surgical History:  Procedure Laterality Date   HEMORRHOID SURGERY N/A 11/12/2015   Procedure: EXTENSIVE HEMORRHOIDECTOMY;  Surgeon: Franky Macho, MD;  Location: [facility];  Service: General;  Laterality: N/A;   Family History: History reviewed. No pertinent family history. Family Psychiatric  History: See previous Social History:  Social History   Substance and Sexual Activity  Alcohol Use Yes   Comment: last drink 10/25/2019, pt said he had 3 beers     Social History   Substance and Sexual Activity  Drug Use Yes   Types: Heroin, Methamphetamines, Other-see  comments   Comment: was using synthetic heroin daily for 2 years but stopped 2 months ago, methamphetamine daily for 3 months until June (last use 10/25/2019- "just a pinch"), "mushrooms yesterday," and "ketamine today"    Social History   Socioeconomic History   Marital status: Single    Spouse name: Not on file   Number of children: Not on file   Years of education: Not on file   Highest education level: Not on file  Occupational History   Not on file  Tobacco Use   Smoking status: Former    Packs/day: 0.50    Years: 10.00    Total pack years: 5.00    Types: Cigarettes   Smokeless tobacco: Never  Vaping Use   Vaping Use: Every day  Substance and Sexual Activity   Alcohol use: Yes    Comment: last drink 10/25/2019, pt said he had 3 beers   Drug use: Yes    Types: Heroin, Methamphetamines, Other-see comments    Comment: was using synthetic heroin daily for 2 years but stopped 2 months ago, methamphetamine daily for 3 months until June (last use 10/25/2019- "just a pinch"), "mushrooms yesterday," and "ketamine today"   Sexual activity: Never    Birth control/protection: None  Other Topics Concern   Not on file  Social History Narrative   Not on file   Social Determinants of Health   Financial Resource Strain: Not on file  Food Insecurity: No Food Insecurity (11/13/2021)   Hunger Vital Sign  Worried About Programme researcher, broadcasting/film/video in the Last Year: Never true    Ran Out of Food in the Last Year: Never true  Transportation Needs: No Transportation Needs (11/13/2021)   PRAPARE - Administrator, Civil Service (Medical): No    Lack of Transportation (Non-Medical): No  Physical Activity: Not on file  Stress: Not on file  Social Connections: Not on file   Additional Social History:                         Sleep: Fair  Appetite:  Fair  Current Medications: Current Facility-Administered Medications  Medication Dose Route Frequency Provider Last Rate  Last Admin   acetaminophen (TYLENOL) tablet 650 mg  650 mg Oral Q6H PRN Nury Nebergall,  T, MD   650 mg at 11/15/21 1701   alum & mag hydroxide-simeth (MAALOX/MYLANTA) 200-200-20 MG/5ML suspension 30 mL  30 mL Oral Q4H PRN Ashten Prats,  T, MD   30 mL at 11/14/21 0744   benztropine (COGENTIN) tablet 0.5 mg  0.5 mg Oral BID Gladyse Corvin,  T, MD       buprenorphine-naloxone (SUBOXONE) 8-2 mg per SL tablet 1 tablet  1 tablet Sublingual Daily Adair Lauderback, Jackquline Denmark, MD   1 tablet at 11/18/21 7412   feeding supplement (ENSURE ENLIVE / ENSURE PLUS) liquid 237 mL  237 mL Oral BID BM Leiya Keesey T, MD   237 mL at 11/18/21 1419   gabapentin (NEURONTIN) capsule 600 mg  600 mg Oral TID Brita Jurgensen,  T, MD   600 mg at 11/18/21 1134   haloperidol (HALDOL) tablet 5 mg  5 mg Oral BID Pretty Weltman, Jackquline Denmark, MD       hydrOXYzine (ATARAX) tablet 50 mg  50 mg Oral Q6H PRN Kendarious Gudino,  T, MD   50 mg at 11/18/21 0901   magnesium hydroxide (MILK OF MAGNESIA) suspension 30 mL  30 mL Oral Daily PRN Theo Reither, Jackquline Denmark, MD       methocarbamol (ROBAXIN) tablet 500 mg  500 mg Oral TID Jaretzy Lhommedieu T, MD   500 mg at 11/18/21 1134   multivitamin with minerals tablet 1 tablet  1 tablet Oral Daily Dakia Schifano, Jackquline Denmark, MD   1 tablet at 11/18/21 8786   nicotine (NICODERM CQ - dosed in mg/24 hours) patch 21 mg  21 mg Transdermal Daily , Jackquline Denmark, MD   21 mg at 11/15/21 7672   nicotine polacrilex (NICORETTE) gum 2 mg  2 mg Oral PRN , Jackquline Denmark, MD   2 mg at 11/15/21 2107   pantoprazole (PROTONIX) EC tablet 40 mg  40 mg Oral BID Vann Okerlund T, MD   40 mg at 11/18/21 0824   potassium chloride SA (KLOR-CON M) CR tablet 40 mEq  40 mEq Oral Daily , Jackquline Denmark, MD   40 mEq at 11/18/21 0947   thiamine (VITAMIN B1) tablet 100 mg  100 mg Oral Daily , Jackquline Denmark, MD   100 mg at 11/18/21 0824   traZODone (DESYREL) tablet 100 mg  100 mg Oral QHS PRN , Jackquline Denmark, MD   100 mg at 11/17/21 2125    Lab Results: No results found for this or any  previous visit (from the past 48 hour(s)).  Blood Alcohol level:  Lab Results  Component Value Date   [facility] <10 02/20/2021   ETH <10 03/19/2020    Metabolic Disorder Labs: Lab Results  Component Value Date   HGBA1C 5.2 10/27/2019  MPG 102.54 10/27/2019   Lab Results  Component Value Date   PROLACTIN 30.2 (H) 10/27/2019   No results found for: "CHOL", "TRIG", "HDL", "CHOLHDL", "VLDL", "LDLCALC"  Physical Findings: AIMS:  , ,  ,  ,    CIWA:    COWS:     Musculoskeletal: Strength & Muscle Tone: within normal limits Gait & Station: normal Patient leans: N/A  Psychiatric Specialty Exam:  Presentation  General Appearance:  Appropriate for Environment; Casual; Fairly Groomed  Eye Contact: Fair  Speech: Clear and Coherent; Normal Rate  Speech Volume: Normal  Handedness: Right   Mood and Affect  Mood: Anxious; Dysphoric  Affect: Congruent; Constricted   Thought Process  Thought Processes: Coherent; Disorganized  Descriptions of Associations:Loose  Orientation:Partial  Thought Content:Illogical; Scattered  History of Schizophrenia/Schizoaffective disorder:Yes  Duration of Psychotic Symptoms:Greater than six months  Hallucinations:No data recorded Ideas of Reference:Delusions  Suicidal Thoughts:No data recorded Homicidal Thoughts:No data recorded  Sensorium  Memory: Immediate Fair  Judgment: Impaired  Insight: Lacking   Executive Functions  Concentration: Fair  Attention Span: Fair  Recall: Poor  Fund of Knowledge: Fair  Language: Fair   Psychomotor Activity  Psychomotor Activity:No data recorded  Assets  Assets: Communication Skills; Physical Health; Social Support   Sleep  Sleep:No data recorded   Physical Exam: Physical Exam Vitals and nursing note reviewed.  Constitutional:      Appearance: Normal appearance.  HENT:     Head: Normocephalic and atraumatic.     Mouth/Throat:     Pharynx: Oropharynx is  clear.  Eyes:     Pupils: Pupils are equal, round, and reactive to light.  Cardiovascular:     Rate and Rhythm: Normal rate and regular rhythm.  Pulmonary:     Effort: Pulmonary effort is normal.     Breath sounds: Normal breath sounds.  Abdominal:     General: Abdomen is flat.     Palpations: Abdomen is soft.  Musculoskeletal:        General: Normal range of motion.  Skin:    General: Skin is warm and dry.  Neurological:     General: No focal deficit present.     Mental Status: He is alert. Mental status is at baseline.  Psychiatric:        Attention and Perception: Attention normal.        Mood and Affect: Mood normal. Affect is blunt.        Speech: Speech is tangential.        Behavior: Behavior is slowed.        Thought Content: Thought content is delusional.        Cognition and Memory: Memory is impaired.    Review of Systems  Constitutional: Negative.   HENT: Negative.    Eyes: Negative.   Respiratory: Negative.    Cardiovascular: Negative.   Gastrointestinal: Negative.   Musculoskeletal: Negative.   Skin: Negative.   Neurological: Negative.   Psychiatric/Behavioral:  Positive for depression and hallucinations. The patient is nervous/anxious.    Blood pressure 123/87, pulse (!) 103, temperature 98.4 F (36.9 C), temperature source Oral, resp. rate 18, height 5\' 5"  (1.651 m), weight 63.5 kg, SpO2 100 %. Body mass index is 23.3 kg/m.   Treatment Plan Summary: Plan patient asked that his antipsychotic be changed from olanzapine to "something different".  He has some sort of memory that olanzapine was ineffective.  I agreed to try switching medicines as he has not seemed much different from being on  it we will try Haldol 5 mg twice a day plus Cogentin half a milligram twice a day.  He used to be stabilizing may be getting closer to discharge in the next couple days  Mordecai Rasmussen, MD 11/18/2021, 3:26 PM

## 2021-11-18 NOTE — Progress Notes (Signed)
Recreation Therapy Notes  Date: 11/18/2021  Time: 10:50 am   Location: Craft room    Behavioral response: Appropriate  Intervention Topic:  Time Management     Discussion/Intervention:  Group content today was focused on time management. The group defined time management and identified healthy ways to manage time. Individuals expressed how much of the 24 hours they use in a day. Patients expressed how much time they use just for themselves personally. The group expressed how they have managed their time in the past. Individuals participated in the intervention "Managing Life" where they had a chance to see how much of the 24 hours they use and where it goes. Clinical Observations/Feedback: Patient came to group and was focused on what peers and staff had to say about time management. He defined time management as something he needs work in. Participant explained that being depressed is what impacts his time management. Individual was social with peers and staff while participating in the intervention.    Daneisha Surges LRT/CTRS         Sherby Moncayo 11/18/2021 11:58 AM

## 2021-11-19 ENCOUNTER — Other Ambulatory Visit: Payer: Self-pay

## 2021-11-19 LAB — LIPID PANEL
Cholesterol: 199 mg/dL (ref 0–200)
HDL: 33 mg/dL — ABNORMAL LOW (ref >40)
LDL Cholesterol: 94 mg/dL (ref 0–99)
Total CHOL/HDL Ratio: 6 RATIO
Triglycerides: 362 mg/dL — ABNORMAL HIGH (ref <150)
VLDL: 72 mg/dL — ABNORMAL HIGH (ref 0–40)

## 2021-11-19 LAB — HEMOGLOBIN A1C
Hgb A1c MFr Bld: 4.8 % (ref 4.8–5.6)
Mean Plasma Glucose: 91.06 mg/dL

## 2021-11-19 MED ORDER — QUETIAPINE FUMARATE 200 MG PO TABS
200.0000 mg | ORAL_TABLET | Freq: Every day | ORAL | Status: DC
  Administered 2021-11-19: 200 mg via ORAL
  Filled 2021-11-19: qty 1

## 2021-11-19 MED ORDER — THIAMINE HCL 100 MG PO TABS
100.0000 mg | ORAL_TABLET | Freq: Every day | ORAL | 0 refills | Status: AC
  Filled 2021-11-19: qty 30, 30d supply, fill #0

## 2021-11-19 MED ORDER — PANTOPRAZOLE SODIUM 40 MG PO TBEC
40.0000 mg | DELAYED_RELEASE_TABLET | Freq: Two times a day (BID) | ORAL | 0 refills | Status: AC
  Filled 2021-11-19: qty 60, 30d supply, fill #0

## 2021-11-19 MED ORDER — METHOCARBAMOL 500 MG PO TABS
500.0000 mg | ORAL_TABLET | Freq: Three times a day (TID) | ORAL | 0 refills | Status: AC
  Filled 2021-11-19: qty 90, 30d supply, fill #0

## 2021-11-19 MED ORDER — VITAMIN B-1 100 MG PO TABS: 100.0000 mg | ORAL_TABLET | Freq: Every day | ORAL | 1 refills | Status: DC

## 2021-11-19 MED ORDER — GABAPENTIN 300 MG PO CAPS: 600.0000 mg | ORAL_CAPSULE | Freq: Three times a day (TID) | ORAL | 1 refills | Status: DC

## 2021-11-19 MED ORDER — BENZTROPINE MESYLATE 0.5 MG PO TABS: 0.5000 mg | ORAL_TABLET | Freq: Two times a day (BID) | ORAL | 1 refills | Status: DC

## 2021-11-19 MED ORDER — BUPRENORPHINE HCL-NALOXONE HCL 8-2 MG SL SUBL: 1.0000 | SUBLINGUAL_TABLET | Freq: Every day | SUBLINGUAL | 0 refills | Status: AC

## 2021-11-19 MED ORDER — HYDROXYZINE HCL 50 MG PO TABS: 50.0000 mg | ORAL_TABLET | Freq: Four times a day (QID) | ORAL | 1 refills | Status: DC | PRN

## 2021-11-19 MED ORDER — METHOCARBAMOL 500 MG PO TABS: 500.0000 mg | ORAL_TABLET | Freq: Three times a day (TID) | ORAL | 1 refills | Status: DC

## 2021-11-19 MED ORDER — TRAZODONE HCL 100 MG PO TABS
100.0000 mg | ORAL_TABLET | Freq: Every evening | ORAL | 0 refills | Status: AC | PRN
  Filled 2021-11-19: qty 30, 30d supply, fill #0

## 2021-11-19 MED ORDER — QUETIAPINE FUMARATE 200 MG PO TABS
200.0000 mg | ORAL_TABLET | Freq: Every day | ORAL | 0 refills | Status: DC
  Filled 2021-11-19: qty 30, 30d supply, fill #0

## 2021-11-19 MED ORDER — GABAPENTIN 300 MG PO CAPS
600.0000 mg | ORAL_CAPSULE | Freq: Three times a day (TID) | ORAL | 0 refills | Status: AC
  Filled 2021-11-19: qty 180, 30d supply, fill #0

## 2021-11-19 MED ORDER — TRAZODONE HCL 100 MG PO TABS: 100.0000 mg | ORAL_TABLET | Freq: Every evening | ORAL | 1 refills | Status: DC | PRN

## 2021-11-19 MED ORDER — HYDROXYZINE HCL 50 MG PO TABS
50.0000 mg | ORAL_TABLET | Freq: Four times a day (QID) | ORAL | 0 refills | Status: AC | PRN
  Filled 2021-11-19: qty 60, 15d supply, fill #0

## 2021-11-19 MED ORDER — PANTOPRAZOLE SODIUM 40 MG PO TBEC: 40.0000 mg | DELAYED_RELEASE_TABLET | Freq: Two times a day (BID) | ORAL | 1 refills | Status: DC

## 2021-11-19 MED ORDER — QUETIAPINE FUMARATE 200 MG PO TABS: 200.0000 mg | ORAL_TABLET | Freq: Every day | ORAL | 1 refills | Status: DC

## 2021-11-19 MED ORDER — BENZTROPINE MESYLATE 0.5 MG PO TABS
0.5000 mg | ORAL_TABLET | Freq: Two times a day (BID) | ORAL | 0 refills | Status: DC
  Filled 2021-11-19: qty 60, 30d supply, fill #0

## 2021-11-19 NOTE — Group Note (Signed)
LCSW Group Therapy Note   Group Date: 11/19/2021 Start Time: 1300 End Time: 1400   Type of Therapy and Topic:  Group Therapy: Boundaries  Participation Level:  Active  Description of Group: This group will address the use of boundaries in their personal lives. Patients will explore why boundaries are important, the difference between healthy and unhealthy boundaries, and negative and postive outcomes of different boundaries and will look at how boundaries can be crossed.  Patients will be encouraged to identify current boundaries in their own lives and identify what kind of boundary is being set. Facilitators will guide patients in utilizing problem-solving interventions to address and correct types boundaries being used and to address when no boundary is being used. Understanding and applying boundaries will be explored and addressed for obtaining and maintaining a balanced life. Patients will be encouraged to explore ways to assertively make their boundaries and needs known to significant others in their lives, using other group members and facilitator for role play, support, and feedback.  Therapeutic Goals:  1.  Patient will identify areas in their life where setting clear boundaries could be  used to improve their life.  2.  Patient will identify signs/triggers that a boundary is not being respected. 3.  Patient will identify two ways to set boundaries in order to achieve balance in  their lives: 4.  Patient will demonstrate ability to communicate their needs and set boundaries  through discussion and/or role plays  Summary of Patient Progress:  Patient was present for the entirety of the group session. Patient was an active listener and participated in the topic of discussion, provided helpful advice to others, and added nuance to topic of conversation. Patient states he tends to enforce rigid boundaries, believes he is better able to manage relationships in this manner. Participants  allowed to provide feedback to patient to explore potential consequences of rigid boundaries.   Therapeutic Modalities:   Cognitive Behavioral Therapy Solution-Focused Therapy  Ronald Hoffman 11/19/2021  3:34 PM

## 2021-11-19 NOTE — Plan of Care (Signed)
  Problem: Education: Goal: Knowledge of General Education information will improve Description: Including pain rating scale, medication(s)/side effects and non-pharmacologic comfort measures Outcome: Progressing   Problem: Health Behavior/Discharge Planning: Goal: Ability to manage health-related needs will improve Outcome: Progressing   Problem: Clinical Measurements: Goal: Ability to maintain clinical measurements within normal limits will improve Outcome: Progressing   Problem: Nutrition: Goal: Adequate nutrition will be maintained Outcome: Progressing   Problem: Safety: Goal: Ability to remain free from injury will improve Outcome: Progressing   

## 2021-11-19 NOTE — Progress Notes (Signed)
Patient is A+O x 4. Patient is calm and cooperative. He denies SI/HI/AVH. Denies anxiety and depression. Mood pleasant. Affect appropriate. Medication compliant. Patient is interacting positively with staff and peers.   Hydroxyzine adm for anxiety. Patient tolerated well. Upon follow up, anxiety decreased.  Q15 minute unit checks in place.

## 2021-11-19 NOTE — Progress Notes (Signed)
Patient presents with a bizarre affect, speaking latin at times to staff. Pt observed interacting appropriately with staff and peers on the unit. Pt compliant with medication administration per MD orders. Pt given education, support, and encouragement to be active in his treatment plan. Pt being monitored Q 15 minutes for safety per unit protocol, remains safe on the unit

## 2021-11-19 NOTE — Progress Notes (Signed)
[facility] MD Progress Note  11/19/2021 3:23 PM Ronald Hoffman  MRN:  [id number] Subjective: Follow-up 41 year old man with schizophrenia and substance abuse.  Patient clearly functioning better.  Gets along well with peers.  He complains to me that he sometimes feels "spacey" and cannot concentrate although it has not clear exactly what sort of symptom this is.  He is not complaining of hallucinations.  Denies suicidal thoughts.  Says he actually feels rather good overall Principal Problem: Schizophrenia ([place]) Diagnosis: Principal Problem:   Schizophrenia ([place]) Active Problems:   Substance-induced psychotic disorder ([place])   Alcohol abuse   Cocaine abuse ([place])  Total Time spent with patient: 30 minutes  Past Psychiatric History: Past history of recurrent psychotic symptoms and substance abuse  Past Medical History:  Past Medical History:  Diagnosis Date   Acute psychosis ([place]) 10/26/2019   Anxiety    Delusional disorder ([place]) 10/25/2019   Drug abuse ([place]) 03/19/2020   Hyperthyroidism    Lactic acidosis 02/21/2021   Seizures ([place])    stress related; no more seizures since first one 10 years ago; on no meds.   Sepsis ([place])    SIRS (systemic inflammatory response syndrome) ([place]) 02/20/2021   Substance-induced psychotic disorder ([place]) 03/19/2020   Tachycardia     Past Surgical History:  Procedure Laterality Date   HEMORRHOID SURGERY N/A 11/12/2015   Procedure: EXTENSIVE HEMORRHOIDECTOMY;  Surgeon: Aviva Signs, MD;  Location: [facility];  Service: General;  Laterality: N/A;   Family History: History reviewed. No pertinent family history. Family Psychiatric  History: See previous Social History:  Social History   Substance and Sexual Activity  Alcohol Use Yes   Comment: last drink 10/25/2019, pt said he had 3 beers     Social History   Substance and Sexual Activity  Drug Use Yes   Types: Heroin, Methamphetamines, Other-see comments   Comment: was using synthetic heroin daily for 2 years but  stopped 2 months ago, methamphetamine daily for 3 months until June (last use 10/25/2019- "just a pinch"), "mushrooms yesterday," and "ketamine today"    Social History   Socioeconomic History   Marital status: Single    Spouse name: Not on file   Number of children: Not on file   Years of education: Not on file   Highest education level: Not on file  Occupational History   Not on file  Tobacco Use   Smoking status: Former    Packs/day: 0.50    Years: 10.00    Total pack years: 5.00    Types: Cigarettes   Smokeless tobacco: Never  Vaping Use   Vaping Use: Every day  Substance and Sexual Activity   Alcohol use: Yes    Comment: last drink 10/25/2019, pt said he had 3 beers   Drug use: Yes    Types: Heroin, Methamphetamines, Other-see comments    Comment: was using synthetic heroin daily for 2 years but stopped 2 months ago, methamphetamine daily for 3 months until June (last use 10/25/2019- "just a pinch"), "mushrooms yesterday," and "ketamine today"   Sexual activity: Never    Birth control/protection: None  Other Topics Concern   Not on file  Social History Narrative   Not on file   Social Determinants of Health   Financial Resource Strain: Not on file  Food Insecurity: No Food Insecurity (11/13/2021)   Hunger Vital Sign    Worried About Running Out of Food in the Last Year: Never true    Ran Out of Food in  the Last Year: Never true  Transportation Needs: No Transportation Needs (11/13/2021)   PRAPARE - Administrator, Civil Service (Medical): No    Lack of Transportation (Non-Medical): No  Physical Activity: Not on file  Stress: Not on file  Social Connections: Not on file   Additional Social History:                         Sleep: Fair  Appetite:  Fair  Current Medications: Current Facility-Administered Medications  Medication Dose Route Frequency Provider Last Rate Last Admin   acetaminophen (TYLENOL) tablet 650 mg  650 mg Oral Q6H PRN  Abbiegail Landgren T, MD   650 mg at 11/15/21 1701   alum & mag hydroxide-simeth (MAALOX/MYLANTA) 200-200-20 MG/5ML suspension 30 mL  30 mL Oral Q4H PRN Jaiden Dinkins,  T, MD   30 mL at 11/14/21 0744   benztropine (COGENTIN) tablet 0.5 mg  0.5 mg Oral BID Cammie Faulstich T, MD   0.5 mg at 11/19/21 7062   buprenorphine-naloxone (SUBOXONE) 8-2 mg per SL tablet 1 tablet  1 tablet Sublingual Daily Nami Strawder, Jackquline Denmark, MD   1 tablet at 11/19/21 3762   feeding supplement (ENSURE ENLIVE / ENSURE PLUS) liquid 237 mL  237 mL Oral BID BM Armas Mcbee T, MD   237 mL at 11/18/21 1419   gabapentin (NEURONTIN) capsule 600 mg  600 mg Oral TID Noele Icenhour T, MD   600 mg at 11/19/21 1218   hydrOXYzine (ATARAX) tablet 50 mg  50 mg Oral Q6H PRN Rohan Juenger T, MD   50 mg at 11/18/21 2106   magnesium hydroxide (MILK OF MAGNESIA) suspension 30 mL  30 mL Oral Daily PRN Shaunika Italiano, Jackquline Denmark, MD       methocarbamol (ROBAXIN) tablet 500 mg  500 mg Oral TID Tanasia Budzinski,  T, MD   500 mg at 11/19/21 1218   multivitamin with minerals tablet 1 tablet  1 tablet Oral Daily Jakeia Carreras, Jackquline Denmark, MD   1 tablet at 11/19/21 8315   nicotine (NICODERM CQ - dosed in mg/24 hours) patch 21 mg  21 mg Transdermal Daily Jovon Winterhalter, Jackquline Denmark, MD   21 mg at 11/15/21 1761   nicotine polacrilex (NICORETTE) gum 2 mg  2 mg Oral PRN , Jackquline Denmark, MD   2 mg at 11/15/21 2107   pantoprazole (PROTONIX) EC tablet 40 mg  40 mg Oral BID Jusiah Aguayo T, MD   40 mg at 11/19/21 6073   potassium chloride SA (KLOR-CON M) CR tablet 40 mEq  40 mEq Oral Daily Baylee Mccorkel T, MD   40 mEq at 11/19/21 7106   QUEtiapine (SEROQUEL) tablet 100 mg  100 mg Oral QHS Laxmi Choung T, MD   100 mg at 11/18/21 2106   thiamine (VITAMIN B1) tablet 100 mg  100 mg Oral Daily Symphonie Schneiderman T, MD   100 mg at 11/19/21 2694   traZODone (DESYREL) tablet 100 mg  100 mg Oral QHS PRN , Jackquline Denmark, MD   100 mg at 11/18/21 2106    Lab Results: No results found for this or any previous visit (from the  past 48 hour(s)).  Blood Alcohol level:  Lab Results  Component Value Date   [facility] <10 02/20/2021   ETH <10 03/19/2020    Metabolic Disorder Labs: Lab Results  Component Value Date   HGBA1C 5.2 10/27/2019   MPG 102.54 10/27/2019   Lab Results  Component Value Date  PROLACTIN 30.2 (H) 10/27/2019   No results found for: "CHOL", "TRIG", "HDL", "CHOLHDL", "VLDL", "LDLCALC"  Physical Findings: AIMS:  , ,  ,  ,    CIWA:    COWS:     Musculoskeletal: Strength & Muscle Tone: within normal limits Gait & Station: normal Patient leans: N/A  Psychiatric Specialty Exam:  Presentation  General Appearance:  Appropriate for Environment; Casual; Fairly Groomed  Eye Contact: Fair  Speech: Clear and Coherent; Normal Rate  Speech Volume: Normal  Handedness: Right   Mood and Affect  Mood: Anxious; Dysphoric  Affect: Congruent; Constricted   Thought Process  Thought Processes: Coherent; Disorganized  Descriptions of Associations:Loose  Orientation:Partial  Thought Content:Illogical; Scattered  History of Schizophrenia/Schizoaffective disorder:Yes  Duration of Psychotic Symptoms:Greater than six months  Hallucinations:No data recorded Ideas of Reference:Delusions  Suicidal Thoughts:No data recorded Homicidal Thoughts:No data recorded  Sensorium  Memory: Immediate Fair  Judgment: Impaired  Insight: Lacking   Executive Functions  Concentration: Fair  Attention Span: Fair  Recall: Poor  Fund of Knowledge: Fair  Language: Fair   Psychomotor Activity  Psychomotor Activity:No data recorded  Assets  Assets: Communication Skills; Physical Health; Social Support   Sleep  Sleep:No data recorded   Physical Exam: Physical Exam Vitals reviewed.  Constitutional:      Appearance: Normal appearance.  HENT:     Head: Normocephalic and atraumatic.     Mouth/Throat:     Pharynx: Oropharynx is clear.  Eyes:     Pupils: Pupils are  equal, round, and reactive to light.  Cardiovascular:     Rate and Rhythm: Normal rate and regular rhythm.  Pulmonary:     Effort: Pulmonary effort is normal.     Breath sounds: Normal breath sounds.  Abdominal:     General: Abdomen is flat.     Palpations: Abdomen is soft.  Musculoskeletal:        General: Normal range of motion.  Skin:    General: Skin is warm and dry.  Neurological:     General: No focal deficit present.     Mental Status: He is alert. Mental status is at baseline.  Psychiatric:        Attention and Perception: Attention normal.        Mood and Affect: Mood normal.        Speech: Speech normal.        Behavior: Behavior is cooperative.        Thought Content: Thought content normal.        Cognition and Memory: Cognition normal.    Review of Systems  Constitutional: Negative.   HENT: Negative.    Eyes: Negative.   Respiratory: Negative.    Cardiovascular: Negative.   Gastrointestinal: Negative.   Musculoskeletal: Negative.   Skin: Negative.   Neurological: Negative.   Psychiatric/Behavioral: Negative.     Blood pressure (!) 117/91, pulse (!) 109, temperature (!) 97.5 F (36.4 C), temperature source Oral, resp. rate 16, height 5\' 5"  (1.651 m), weight 63.5 kg, SpO2 100 %. Body mass index is 23.3 kg/m.   Treatment Plan Summary: Medication management and Plan doing very well.  Much improved.  No sign of acute dangerousness.  Proposed to patient we will plan for discharge tomorrow with follow-[place].  I will go ahead and try ordering a supply of medicines.  , MD 11/19/2021, 3:23 PM

## 2021-11-19 NOTE — Progress Notes (Signed)
Recreation Therapy Notes  Date: 11/19/2021  Time: 10:40 am   Location: Craft room    Behavioral response: Appropriate  Intervention Topic:  Goals   Discussion/Intervention:  Group content on today was focused on goals. Patients described what goals are and how they define goals. Individuals expressed how they go about setting goals and reaching them. The group identified how important goals are and if they make short term goals to reach long term goals. Patients described how many goals they work on at a time and what affects them not reaching their goal. Individuals described how much time they put into planning and obtaining their goals. The group participated in the intervention "My Goal Board" and made personal goal boards to help them achieve their goal. Clinical Observations/Feedback: Patient came to group and was focused on what peers and staff had to say about setting goals. He explained that he makes goals based off how he feels. Individual was social with peers and staff while participating in the intervention.    Ronald Hoffman LRT/CTRS         Ronald Hoffman 11/19/2021 12:24 PM

## 2021-11-20 ENCOUNTER — Other Ambulatory Visit: Payer: Self-pay

## 2021-11-20 DIAGNOSIS — F203 Undifferentiated schizophrenia: Secondary | ICD-10-CM | POA: Diagnosis not present

## 2021-11-20 NOTE — [facility] Suicide Risk Assessment (Signed)
[facility] Discharge Suicide Risk Assessment   Principal Problem: Schizophrenia [facility]) Discharge Diagnoses: Principal Problem:   Schizophrenia ([facility]) Active Problems:   Substance-induced psychotic disorder ([facility])   Alcohol abuse   Cocaine abuse ([facility])   Total Time spent with patient: 30 minutes  Musculoskeletal: Strength & Muscle Tone: within normal limits Gait & Station: normal Patient leans: N/A  Psychiatric Specialty Exam  Presentation  General Appearance:  Appropriate for Environment; Casual; Fairly Groomed  Eye Contact: Fair  Speech: Clear and Coherent; Normal Rate  Speech Volume: Normal  Handedness: Right   Mood and Affect  Mood: Anxious; Dysphoric  Duration of Depression Symptoms: Greater than two weeks  Affect: Congruent; Constricted   Thought Process  Thought Processes: Coherent; Disorganized  Descriptions of Associations:Loose  Orientation:Partial  Thought Content:Illogical; Scattered  History of Schizophrenia/Schizoaffective disorder:Yes  Duration of Psychotic Symptoms:Greater than six months  Hallucinations:No data recorded Ideas of Reference:Delusions  Suicidal Thoughts:No data recorded Homicidal Thoughts:No data recorded  Sensorium  Memory: Immediate Fair  Judgment: Impaired  Insight: Lacking   Executive Functions  Concentration: Fair  Attention Span: Fair  Recall: Poor  Fund of Knowledge: Fair  Language: Fair   Psychomotor Activity  Psychomotor Activity:No data recorded  Assets  Assets: Communication Skills; Physical Health; Social Support   Sleep  Sleep:No data recorded  Physical Exam: Physical Exam Vitals and nursing note reviewed.  Constitutional:      Appearance: Normal appearance.  HENT:     Head: Normocephalic and atraumatic.     Mouth/Throat:     Pharynx: Oropharynx is clear.  Eyes:     Pupils: Pupils are equal, round, and reactive to light.  Cardiovascular:     Rate and Rhythm: Normal  rate and regular rhythm.  Pulmonary:     Effort: Pulmonary effort is normal.     Breath sounds: Normal breath sounds.  Abdominal:     General: Abdomen is flat.     Palpations: Abdomen is soft.  Musculoskeletal:        General: Normal range of motion.  Skin:    General: Skin is warm and dry.  Neurological:     General: No focal deficit present.     Mental Status: He is alert. Mental status is at baseline.  Psychiatric:        Attention and Perception: Attention normal.        Mood and Affect: Mood normal.        Speech: Speech normal.        Behavior: Behavior normal.        Thought Content: Thought content normal.        Cognition and Memory: Cognition normal.        Judgment: Judgment normal.    Review of Systems  Constitutional: Negative.   HENT: Negative.    Eyes: Negative.   Respiratory: Negative.    Cardiovascular: Negative.   Gastrointestinal: Negative.   Musculoskeletal: Negative.   Skin: Negative.   Neurological: Negative.   Psychiatric/Behavioral: Negative.     Blood pressure 126/83, pulse (!) 115, temperature 97.7 F (36.5 C), temperature source Oral, resp. rate 20, height 5\' 5"  (1.651 m), weight 63.5 kg, SpO2 98 %. Body mass index is 23.3 kg/m.  Mental Status Per Nursing Assessment::   On Admission:  NA  Demographic Factors:  Male  Loss Factors: Financial problems/change in socioeconomic status  Historical Factors: Impulsivity  Risk Reduction Factors:   Living with another person, especially a relative  Continued Clinical Symptoms:  Alcohol/Substance Abuse/Dependencies  Schizophrenia:   Paranoid or undifferentiated type  Cognitive Features That Contribute To Risk:  Thought constriction (tunnel vision)    Suicide Risk:  Minimal: No identifiable suicidal ideation.  Patients presenting with no risk factors but with morbid ruminations; may be classified as minimal risk based on the severity of the depressive symptoms    Plan Of Care/Follow-up  recommendations:  Other:  Patient is discharged on current medication with a supply provided as well and is referred for outpatient treatment in his home community.  Patient's behavior has been calm and appropriate with no aggression and he is not voicing any suicidal ideation and seems upbeat about the future  Alethia Berthold, MD 11/20/2021, 10:04 AM

## 2021-11-20 NOTE — Discharge Summary (Signed)
Physician Discharge Summary Note  Patient:  Ronald Hoffman is an 41 y.o., male MRN:  [id number] DOB:  [birth year] Patient phone:  [phone] (home)  Patient address:   [address] [place] [id number],  Total Time spent with patient: 30 minutes  Date of Admission:  11/13/2021 Date of Discharge: 11/20/2021  Reason for Admission: Admitted in transfer from [facility] where he had presented with disorganized behavior confusion and possible hallucinations substance use possible concerns about suicidal thoughts  Principal Problem: Schizophrenia [facility]) Discharge Diagnoses: Principal Problem:   Schizophrenia ([place]) Active Problems:   Substance-induced psychotic disorder ([place])   Alcohol abuse   Cocaine abuse ([place])   Past Psychiatric History: Past history of what sounds like psychotic symptoms and chronic substance abuse with substance abuse underlying most of the problems he has had.  Past Medical History:  Past Medical History:  Diagnosis Date   Acute psychosis ([place]) 10/26/2019   Anxiety    Delusional disorder ([place]) 10/25/2019   Drug abuse ([place]) 03/19/2020   Hyperthyroidism    Lactic acidosis 02/21/2021   Seizures ([place])    stress related; no more seizures since first one 10 years ago; on no meds.   Sepsis ([place])    SIRS (systemic inflammatory response syndrome) ([place]) 02/20/2021   Substance-induced psychotic disorder ([place]) 03/19/2020   Tachycardia     Past Surgical History:  Procedure Laterality Date   HEMORRHOID SURGERY N/A 11/12/2015   Procedure: EXTENSIVE HEMORRHOIDECTOMY;  Surgeon: Aviva Signs, MD;  Location: [facility];  Service: General;  Laterality: N/A;   Family History: History reviewed. No pertinent family history. Family Psychiatric  History: See previous Social History:  Social History   Substance and Sexual Activity  Alcohol Use Yes   Comment: last drink 10/25/2019, pt said he had 3 beers     Social History   Substance and Sexual Activity  Drug Use Yes   Types:  Heroin, Methamphetamines, Other-see comments   Comment: was using synthetic heroin daily for 2 years but stopped 2 months ago, methamphetamine daily for 3 months until June (last use 10/25/2019- "just a pinch"), "mushrooms yesterday," and "ketamine today"    Social History   Socioeconomic History   Marital status: Single    Spouse name: Not on file   Number of children: Not on file   Years of education: Not on file   Highest education level: Not on file  Occupational History   Not on file  Tobacco Use   Smoking status: Former    Packs/day: 0.50    Years: 10.00    Total pack years: 5.00    Types: Cigarettes   Smokeless tobacco: Never  Vaping Use   Vaping Use: Every day  Substance and Sexual Activity   Alcohol use: Yes    Comment: last drink 10/25/2019, pt said he had 3 beers   Drug use: Yes    Types: Heroin, Methamphetamines, Other-see comments    Comment: was using synthetic heroin daily for 2 years but stopped 2 months ago, methamphetamine daily for 3 months until June (last use 10/25/2019- "just a pinch"), "mushrooms yesterday," and "ketamine today"   Sexual activity: Never    Birth control/protection: None  Other Topics Concern   Not on file  Social History Narrative   Not on file   Social Determinants of Health   Financial Resource Strain: Not on file  Food Insecurity: No Food Insecurity (11/13/2021)   Hunger Vital Sign    Worried About Running Out of Food in the  Last Year: Never true    [place] in the Last Year: Never true  Transportation Needs: No Transportation Needs (11/13/2021)   PRAPARE - Hydrologist (Medical): No    Lack of Transportation (Non-Medical): No  Physical Activity: Not on file  Stress: Not on file  Social Connections: Not on file    Hospital Course: Admitted to psychiatric unit.  15-minute checks continued.  Patient was engaged in appropriate treatment with individual and group modalities.  Met with treatment  team appropriately.  Medication was used for both psychotic symptoms and for somatic symptoms and also for opiate withdrawal.  Patient had previously been on methadone and stated that he was recently using opiates and so has been started on Suboxone which controlled immediate somatic symptoms.  Also had a brief detox from alcohol and benzodiazepines.  By the time of discharge appears physically stable no longer shaky or confused.  Still occasionally makes some odd grandiose statements but mostly able to stay reality appropriate in conversation.  No evidence of any dangerous behavior.  Plan is for follow-up at day [name].  He is given a supply of medicines other than the controlled substances and given prescriptions for all of his medicines at discharge.  Psychoeducation completed about appropriate outpatient follow-up  Physical Findings: AIMS:  , ,  ,  ,    CIWA:    COWS:     Musculoskeletal: Strength & Muscle Tone: within normal limits Gait & Station: normal Patient leans: N/A   Psychiatric Specialty Exam:  Presentation  General Appearance:  Appropriate for Environment; Casual; Fairly Groomed  Eye Contact: Fair  Speech: Clear and Coherent; Normal Rate  Speech Volume: Normal  Handedness: Right   Mood and Affect  Mood: Anxious; Dysphoric  Affect: Congruent; Constricted   Thought Process  Thought Processes: Coherent; Disorganized  Descriptions of Associations:Loose  Orientation:Partial  Thought Content:Illogical; Scattered  History of Schizophrenia/Schizoaffective disorder:Yes  Duration of Psychotic Symptoms:Greater than six months  Hallucinations:No data recorded Ideas of Reference:Delusions  Suicidal Thoughts:No data recorded Homicidal Thoughts:No data recorded  Sensorium  Memory: Immediate Fair  Judgment: Impaired  Insight: Lacking   Executive Functions  Concentration: Fair  Attention Span: Fair  Recall: Poor  Fund of  Knowledge: Fair  Language: Fair   Psychomotor Activity  Psychomotor Activity:No data recorded  Assets  Assets: Communication Skills; Physical Health; Social Support   Sleep  Sleep:No data recorded   Physical Exam: Physical Exam Vitals and nursing note reviewed.  Constitutional:      Appearance: Normal appearance.  HENT:     Head: Normocephalic and atraumatic.     Mouth/Throat:     Pharynx: Oropharynx is clear.  Eyes:     Pupils: Pupils are equal, round, and reactive to light.  Cardiovascular:     Rate and Rhythm: Normal rate and regular rhythm.  Pulmonary:     Effort: Pulmonary effort is normal.     Breath sounds: Normal breath sounds.  Abdominal:     General: Abdomen is flat.     Palpations: Abdomen is soft.  Musculoskeletal:        General: Normal range of motion.  Skin:    General: Skin is warm and dry.  Neurological:     General: No focal deficit present.     Mental Status: He is alert. Mental status is at baseline.  Psychiatric:        Attention and Perception: Attention normal.  Mood and Affect: Mood normal.        Speech: Speech normal.        Behavior: Behavior is cooperative.        Thought Content: Thought content normal.        Cognition and Memory: Cognition normal.    Review of Systems  Constitutional: Negative.   HENT: Negative.    Eyes: Negative.   Respiratory: Negative.    Cardiovascular: Negative.   Gastrointestinal: Negative.   Musculoskeletal: Negative.   Skin: Negative.   Neurological: Negative.   Psychiatric/Behavioral: Negative.     Blood pressure 126/83, pulse (!) 115, temperature 97.7 F (36.5 C), temperature source Oral, resp. rate 20, height 5' 5"  (1.651 m), weight 63.5 kg, SpO2 98 %. Body mass index is 23.3 kg/m.   Social History   Tobacco Use  Smoking Status Former   Packs/day: 0.50   Years: 10.00   Total pack years: 5.00   Types: Cigarettes  Smokeless Tobacco Never   Tobacco Cessation:  A prescription  for an FDA-approved tobacco cessation medication provided at discharge   Blood Alcohol level:  Lab Results  Component Value Date   ETH <10 02/20/2021   ETH <10 11/02/3005    Metabolic Disorder Labs:  Lab Results  Component Value Date   HGBA1C 4.8 11/19/2021   MPG 91.06 11/19/2021   MPG 102.54 10/27/2019   Lab Results  Component Value Date   PROLACTIN 30.2 (H) 10/27/2019   Lab Results  Component Value Date   CHOL 199 11/19/2021   TRIG 362 (H) 11/19/2021   HDL 33 (L) 11/19/2021   CHOLHDL 6.0 11/19/2021   VLDL 72 (H) 11/19/2021   [place] 94 11/19/2021    See Psychiatric Specialty Exam and Suicide Risk Assessment completed by Attending Physician prior to discharge.  Discharge destination:  Home  Is patient on multiple antipsychotic therapies at discharge:  No   Has Patient had three or more failed trials of antipsychotic monotherapy by history:  No  Recommended Plan for Multiple Antipsychotic Therapies: NA  Discharge Instructions     Diet - low sodium heart healthy   Complete by: As directed    Increase activity slowly   Complete by: As directed       Allergies as of 11/20/2021       Reactions   Hydrocodone Itching   Penicillins Other (See Comments)   Unknown childhood allergy Has patient had a PCN reaction causing immediate rash, facial/tongue/throat swelling, SOB or lightheadedness with hypotension: unknown Has patient had a PCN reaction causing severe rash involving mucus membranes or skin necrosis: unknown Has patient had a PCN reaction that required hospitalization: unknown Has patient had a PCN reaction occurring within the last 10 years:no If all of the above answers are "NO", then may proceed with Cephalosporin use.        Medication List     STOP taking these medications    acetaminophen 325 MG tablet Commonly known as: TYLENOL   folic acid 1 MG tablet Commonly known as: FOLVITE   LORazepam 1 MG tablet Commonly known as: Ativan    metoprolol tartrate 50 MG tablet Commonly known as: LOPRESSOR   multivitamin with minerals Tabs tablet   nicotine 21 mg/24hr patch Commonly known as: NICODERM CQ - dosed in mg/24 hours   OLANZapine 10 MG tablet Commonly known as: ZYPREXA   oxyCODONE 5 MG immediate release tablet Commonly known as: Oxy IR/ROXICODONE   potassium chloride SA 20 MEQ tablet Commonly known as:  KLOR-CON M   thiamine 100 MG tablet Commonly known as: Vitamin B-1   topiramate 50 MG tablet Commonly known as: TOPAMAX       TAKE these medications      Indication  benztropine 0.5 MG tablet Commonly known as: COGENTIN Take 1 tablet (0.5 mg total) by mouth 2 (two) times daily.  Indication: Extrapyramidal Reaction caused by Medications   buprenorphine-naloxone 8-2 mg Subl SL tablet Commonly known as: SUBOXONE Place 1 tablet under the tongue daily.  Indication: Opioid Dependence   gabapentin 300 MG capsule Commonly known as: NEURONTIN Take 2 capsules (600 mg total) by mouth 3 (three) times daily. What changed:  medication strength how much to take  Indication: Neuropathic Pain   hydrOXYzine 50 MG tablet Commonly known as: ATARAX Take 1 tablet (50 mg total) by mouth every 6 (six) hours as needed for anxiety.  Indication: Feeling Anxious   methocarbamol 500 MG tablet Commonly known as: ROBAXIN Take 1 tablet (500 mg total) by mouth 3 (three) times daily.  Indication: Musculoskeletal Pain   pantoprazole 40 MG tablet Commonly known as: PROTONIX Take 1 tablet (40 mg total) by mouth 2 (two) times daily.  Indication: Stomach Ulcer   QUEtiapine 200 MG tablet Commonly known as: SEROQUEL Take 1 tablet (200 mg total) by mouth at bedtime.  Indication: Schizophrenia   thiamine 100 MG tablet Commonly known as: VITAMIN B1 Take 1 tablet (100 mg total) by mouth daily.  Indication: Deficiency of Vitamin B1   traZODone 100 MG tablet Commonly known as: DESYREL Take 1 tablet (100 mg total) by  mouth at bedtime as needed for sleep.  Indication: Trouble Sleeping         Follow-up recommendations:  Other:  Follow-up with outpatient treatment at day [facility] and continue current medicine.  Comments: Psychoeducation and supportive counseling around substance abstinence and appropriate follow-up treatment of mental health.  Signed: Alethia Berthold, MD 11/20/2021, 10:06 AM

## 2021-11-20 NOTE — Progress Notes (Signed)
  [facility] Adult Case Management Discharge Plan :  Will you be returning to the same living situation after discharge:  Yes,  pt plans to return home upon discharge. At discharge, do you have transportation home?: Yes,  support system to provide transportation. Do you have the ability to pay for your medications: No.  Release of information consent forms completed and in the chart;  Patient's signature needed at discharge.  Patient to Follow up at:  Follow-up Information     Services, [facility] .   Why: Your appointment is scheduled for Friday, 11/22/21 at 11am. Please bring your photo ID, social security card, discharge paperwork, and any financial information with you to appointment. Thanks! Contact information: [place] [address] [phone]                 Next level of care provider has access to [place] and Suicide Prevention discussed: Yes,  SPE completed with mother, Emeril Stille.     Has patient been referred to the Quitline?: Patient refused referral  Patient has been referred for addiction treatment: Pt. refused referral  Shirl Harris, LCSW 11/20/2021, 10:45 AM

## 2021-11-20 NOTE — [facility] Counselor (Signed)
CSW met with pt briefly to discuss discharge. He endorsed plans to return home upon discharge via family/support network. Pt voiced interest in outpatient treatment through [facility] or [facility]. CSW did express that he was uncertain whether [facility] would cover him as they were outside of his area but that [facility] would check. Pt agreed. He stated that he has quit smoking cigarettes and denied any need for cessation assistance through [facility]. Pt denied any use of substances (however, UDS at admission was positive for both cocaine and cannabinoids) or need for substance use treatment. CSW informed pt that he would contact both [facility] and [facility] for aftercare appointments. He agreed. When asked if he needed anything else from social work, pt stated that he needed help applying for disability. CSW explained that this process was not something that could be started here but recommended pt going to DSS to begin the process. He was informed that it is a lengthy process and that there are typically a few denials prior to people receiving benefits. CSW explained that many people only get benefits after being turned down a couple of times and then getting a lawyer to work on their case. He voiced understanding, stating that he has already been denied once. He was encouraged to reapply and think about getting a lawyer if this was the direction that he wants to go. He voiced understanding. No other concerns expressed. Contact ended without incident.   CSW contacted [facility] [phone]) to try to schedule aftercare appointment. Unable to make contact but voicemail able to be left.   CSW received call from [facility] who explained that they cannot schedule any pts who have to have IPRS funding in [place] at this time. She did acknowledge that pt had been scheduled there prior but was noted as having [facility] at that time. Lattie Haw also stated that if he had insurance they also could have schedule. CSW informed  her that if pt has insurance, CSW is unaware. No other concerns expressed. Contact ended without incident.   CSW contacted [facility] [phone]) to schedule aftercare appointment. Gwynn assisted CSW with scheduling. Appointment added to AVS.   Chalmers Guest. Guerry Bruin, MSW, LCSW, [place] 11/20/2021 10:36 AM

## 2021-11-20 NOTE — Progress Notes (Signed)
Patient pleasant and cooperative on approach. Denies SI,HI and AVH. Verbalized understanding discharge instructions, follow up care and prescriptions. All belongings from [facility] locker returned to patient. 7 days medicines given to patient. Patient escorted out by staff and transported by family.

## 2021-11-20 NOTE — Progress Notes (Signed)
Recreation Therapy Notes  INPATIENT RECREATION TR PLAN  Patient Details Name: Ronald Hoffman MRN: [id number] DOB: Dec 18, 1980 Today's Date: 11/20/2021  Rec Therapy Plan Is patient appropriate for Therapeutic Recreation?: Yes Treatment times per week: at least 3 Estimated Length of Stay: 5-7 days TR Treatment/Interventions: Group participation (Comment)  Discharge Criteria Pt will be discharged from therapy if:: Discharged Treatment plan/goals/alternatives discussed and agreed upon by:: Patient/family  Discharge Summary Short term goals set: Patient will identify 3 new relaxation techniques to better manage anxiety within 5 recreation therapy group sessions Short term goals met: Complete Progress toward goals comments: Groups attended Which groups?: Goal setting, Stress management, Leisure education, Other (Comment) (Relaxation,Time Management) Reason goals not met: N/A Therapeutic equipment acquired: N/A Reason patient discharged from therapy: Discharge from hospital Pt/family agrees with progress & goals achieved: Yes Date patient discharged from therapy: 11/20/21   Roark Rufo 11/20/2021, 1:09 PM

## 2021-11-20 NOTE — Progress Notes (Signed)
Recreation Therapy Notes  Date: 11/20/2021  Time: 10:45 am   Location: Craft room    Behavioral response: Appropriate  Intervention Topic:  Relaxation   Discussion/Intervention:  Group content today was focused on relaxation. The group defined relaxation and identified healthy ways to relax. Individuals expressed how much time they spend relaxing. Patients expressed how much their life would be if they did not make time for themselves to relax. The group stated ways they could improve their relaxation techniques in the future.  Individuals participated in the intervention "Time to Relax" where they had a chance to experience different relaxation techniques.  Clinical Observations/Feedback: Patient came to group and was focused on what peers and staff had to say about relaxation. He expressed that he relaxes by sitting in a chair and listening to jazz. Participant explained that relaxation is important to relax mind and body. Individual was social with peers and staff while participating in the intervention.    Dearra Myhand LRT/CTRS         Nou Chard 11/20/2021 12:26 PM

## 2021-11-20 NOTE — [facility] Group Notes (Signed)
[facility] [facility] Group Notes:  (Nursing/MHT/Case Management/Adjunct)  Date:  11/20/2021  Time:  9:36 AM  Type of Therapy:   community meeting  Participation Level:  Active  Participation Quality:  Appropriate  Affect:  Appropriate  Cognitive:  Appropriate  Insight:  Appropriate  Engagement in Group:  Engaged  Modes of Intervention:  Discussion and Education  Summary of Progress/Problems:  Ronald Hoffman 11/20/2021, 9:36 AM

## 2022-03-08 ENCOUNTER — Emergency Department (HOSPITAL_COMMUNITY)
Admission: EM | Admit: 2022-03-08 | Discharge: 2022-03-10 | Disposition: A | Discharge status: Home / Self Care | Payer: Medicaid Other | Attending: Emergency Medicine | Admitting: Emergency Medicine

## 2022-03-08 DIAGNOSIS — F23 Brief psychotic disorder: Secondary | ICD-10-CM | POA: Diagnosis not present

## 2022-03-08 DIAGNOSIS — F209 Schizophrenia, unspecified: Secondary | ICD-10-CM | POA: Diagnosis not present

## 2022-03-08 DIAGNOSIS — G8929 Other chronic pain: Secondary | ICD-10-CM | POA: Diagnosis not present

## 2022-03-08 DIAGNOSIS — F29 Unspecified psychosis not due to a substance or known physiological condition: Secondary | ICD-10-CM | POA: Insufficient documentation

## 2022-03-08 DIAGNOSIS — Z79899 Other long term (current) drug therapy: Secondary | ICD-10-CM | POA: Diagnosis not present

## 2022-03-08 DIAGNOSIS — Z046 Encounter for general psychiatric examination, requested by authority: Secondary | ICD-10-CM | POA: Diagnosis present

## 2022-03-08 LAB — COMPREHENSIVE METABOLIC PANEL
ALT: 29 U/L (ref 0–44)
AST: 27 U/L (ref 15–41)
Albumin: 4.8 g/dL (ref 3.5–5.0)
Alkaline Phosphatase: 103 U/L (ref 38–126)
Anion gap: 11 (ref 5–15)
BUN: 14 mg/dL (ref 6–20)
CO2: 22 mmol/L (ref 22–32)
Calcium: 9.6 mg/dL (ref 8.9–10.3)
Chloride: 104 mmol/L (ref 98–111)
Creatinine, Ser: 0.74 mg/dL (ref 0.61–1.24)
GFR, Estimated: >60 mL/min (ref >60)
Glucose, Bld: 109 mg/dL — ABNORMAL HIGH (ref 70–99)
Potassium: 3.4 mmol/L — ABNORMAL LOW (ref 3.5–5.1)
Sodium: 137 mmol/L (ref 135–145)
Total Bilirubin: 0.6 mg/dL (ref 0.3–1.2)
Total Protein: 8.1 g/dL (ref 6.5–8.1)

## 2022-03-08 LAB — ETHANOL: Alcohol, Ethyl (B): <10 mg/dL (ref <10)

## 2022-03-08 LAB — CBC
HCT: 42 % (ref 39.0–52.0)
Hemoglobin: 14.6 g/dL (ref 13.0–17.0)
MCH: 28.3 pg (ref 26.0–34.0)
MCHC: 34.8 g/dL (ref 30.0–36.0)
MCV: 81.6 fL (ref 80.0–100.0)
Platelets: 339 10*3/uL (ref 150–400)
RBC: 5.15 MIL/uL (ref 4.22–5.81)
RDW: 13.1 % (ref 11.5–15.5)
WBC: 10 10*3/uL (ref 4.0–10.5)
nRBC: 0 % (ref 0.0–0.2)

## 2022-03-08 LAB — ACETAMINOPHEN LEVEL: Acetaminophen (Tylenol), Serum: <10 ug/mL — ABNORMAL LOW (ref 10–30)

## 2022-03-08 LAB — SALICYLATE LEVEL: Salicylate Lvl: <7 mg/dL — ABNORMAL LOW (ref 7.0–30.0)

## 2022-03-08 MED ORDER — GABAPENTIN 300 MG PO CAPS
600.0000 mg | ORAL_CAPSULE | Freq: Three times a day (TID) | ORAL | Status: DC
  Administered 2022-03-09 – 2022-03-10 (×5): 600 mg via ORAL
  Filled 2022-03-08 (×5): qty 2

## 2022-03-08 MED ORDER — TRAZODONE HCL 50 MG PO TABS
100.0000 mg | ORAL_TABLET | Freq: Every evening | ORAL | Status: DC | PRN
  Administered 2022-03-09: 100 mg via ORAL
  Filled 2022-03-08 (×2): qty 2

## 2022-03-08 MED ORDER — BUPRENORPHINE HCL-NALOXONE HCL 8-2 MG SL SUBL
1.0000 | SUBLINGUAL_TABLET | Freq: Every day | SUBLINGUAL | Status: DC
  Filled 2022-03-08: qty 1

## 2022-03-08 MED ORDER — QUETIAPINE FUMARATE 100 MG PO TABS
200.0000 mg | ORAL_TABLET | Freq: Every day | ORAL | Status: DC
  Administered 2022-03-09 (×2): 200 mg via ORAL
  Filled 2022-03-08 (×2): qty 2

## 2022-03-08 MED ORDER — HYDROXYZINE HCL 25 MG PO TABS
50.0000 mg | ORAL_TABLET | Freq: Four times a day (QID) | ORAL | Status: DC | PRN
  Administered 2022-03-09 (×2): 50 mg via ORAL
  Filled 2022-03-08 (×2): qty 2

## 2022-03-08 MED ORDER — BENZTROPINE MESYLATE 1 MG PO TABS
0.5000 mg | ORAL_TABLET | Freq: Two times a day (BID) | ORAL | Status: DC
  Administered 2022-03-09 – 2022-03-10 (×4): 0.5 mg via ORAL
  Filled 2022-03-08 (×4): qty 1

## 2022-03-08 NOTE — ED Notes (Signed)
Secure chat message sent to [facility] counselors requesting they see pt ASAP, as we are trying to get pt assessed prior to giving any sedative medication in hopes to speed up pt psych treatment.

## 2022-03-08 NOTE — ED Notes (Signed)
Pt asking for pain shot- Dr Christy Gentles made aware- will given night time meds

## 2022-03-08 NOTE — ED Provider Notes (Signed)
[facility] Provider Note   CSN: [id number] Arrival date & time: 03/08/22  2116     History  Chief Complaint  Patient presents with   IVC   Level 5 caveat due to psychiatric condition Ronald Hoffman is a 42 y.o. male.  The history is provided by the patient.  Patient with history of schizophrenia and substance use disorder presents for concern for psychosis under involuntary commitment.  The following is an excerpt from the IVC: " Respondent is currently at home seeing demons and the KKK are killing his kids.  He is possibly hearing voices.  He reportedly broke the television because voices are coming out of it"   When asked the patient he is unsure why he is in the ER.  He reports he has chronic pain and that his legs are dry  Home Medications Prior to Admission medications   Medication Sig Start Date End Date Taking? Authorizing Provider  benztropine (COGENTIN) 0.5 MG tablet Take 1 tablet (0.5 mg total) by mouth 2 (two) times daily. 11/19/21   Clapacs, Madie Reno, MD  buprenorphine-naloxone (SUBOXONE) 8-2 mg SUBL SL tablet Place 1 tablet under the tongue daily. 11/20/21   Clapacs, Madie Reno, MD  gabapentin (NEURONTIN) 300 MG capsule Take 2 capsules (600 mg total) by mouth 3 (three) times daily. 11/19/21   Clapacs, Madie Reno, MD  hydrOXYzine (ATARAX) 50 MG tablet Take 1 tablet (50 mg total) by mouth every 6 (six) hours as needed for anxiety. 11/19/21   Clapacs, Madie Reno, MD  methocarbamol (ROBAXIN) 500 MG tablet Take 1 tablet (500 mg total) by mouth 3 (three) times daily. 11/19/21   Clapacs, Madie Reno, MD  pantoprazole (PROTONIX) 40 MG tablet Take 1 tablet (40 mg total) by mouth 2 (two) times daily. 11/19/21   Clapacs, Madie Reno, MD  QUEtiapine (SEROQUEL) 200 MG tablet Take 1 tablet (200 mg total) by mouth at bedtime. 11/19/21   Clapacs, Madie Reno, MD  thiamine (VITAMIN B1) 100 MG tablet Take 1 tablet (100 mg total) by mouth daily. 11/20/21   Clapacs, Madie Reno, MD  traZODone (DESYREL) 100 MG tablet Take 1 tablet (100 mg total) by mouth at bedtime as needed for sleep. 11/19/21   Clapacs, Madie Reno, MD      Allergies    Hydrocodone and Penicillins    Review of Systems   Review of Systems  Unable to perform ROS: Psychiatric disorder    Physical Exam Updated Vital Signs BP (!) 123/94   Pulse (!) 124   Temp 98.4 F (36.9 C) (Oral)   Resp 20   Ht 1.702 m (5\' 7" )   Wt 78 kg   SpO2 97%   BMI 26.94 kg/m  Physical Exam CONSTITUTIONAL: Disheveled, no acute distress HEAD: Normocephalic/atraumatic EYES: EOMI/PERRL ENMT: Mucous membranes moist NECK: supple no meningeal signs CV: S1/S2 noted, no murmurs/rubs/gallops noted LUNGS: Lungs are clear to auscultation bilaterally, no apparent distress ABDOMEN: soft, nontender NEURO: Pt is awake/alert/appropriate, moves all extremitiesx4.  No facial droop.   EXTREMITIES: pulses normal/equal, full ROM, shackles noted to left leg prolonged for cement SKIN: warm, color normal PSYCH: Anxious ED Results / Procedures / Treatments   Labs (all labs ordered are listed, but only abnormal results are displayed) Labs Reviewed  COMPREHENSIVE METABOLIC PANEL - Abnormal; Notable for the following components:      Result Value   Potassium 3.4 (*)    Glucose, Bld 109 (*)    All  other components within normal limits  SALICYLATE LEVEL - Abnormal; Notable for the following components:   Salicylate Lvl <4.7 (*)    All other components within normal limits  ACETAMINOPHEN LEVEL - Abnormal; Notable for the following components:   Acetaminophen (Tylenol), Serum <10 (*)    All other components within normal limits  ETHANOL  CBC  RAPID URINE DRUG SCREEN, HOSP PERFORMED    EKG EKG Interpretation  Date/Time:  Saturday March 08 2022 23:42:04 EST Ventricular Rate:  90 PR Interval:  152 QRS Duration: 98 QT Interval:  372 QTC Calculation: 455 R Axis:   10 Text Interpretation: Normal sinus rhythm Incomplete  right bundle branch block Borderline ECG No significant change since last tracing Confirmed by Ripley Fraise [phone]) on 03/08/2022 11:57:28 PM  Radiology No results found.  Procedures Procedures    Medications Ordered in ED Medications  benztropine (COGENTIN) tablet 0.5 mg (has no administration in time range)  gabapentin (NEURONTIN) capsule 600 mg (has no administration in time range)  hydrOXYzine (ATARAX) tablet 50 mg (has no administration in time range)  QUEtiapine (SEROQUEL) tablet 200 mg (has no administration in time range)  traZODone (DESYREL) tablet 100 mg (has no administration in time range)  buprenorphine-naloxone (SUBOXONE) 8-2 mg per SL tablet 1 tablet (has no administration in time range)    ED Course/ Medical Decision Making/ A&P Clinical Course as of 03/09/22 0004  Nancy Fetter Mar 09, 2022  0004 Patient presents with psychosis.  No obvious medical issues at this time.  Home medications have been ordered.  Behavioral health recommends admission.  IVC has been completed.  Heart rate is improved and is now in the 90s. [DW]    Clinical Course User Index [DW] Ripley Fraise, MD                             Medical Decision Making Amount and/or Complexity of Data Reviewed Labs: ordered. ECG/medicine tests: ordered.  Risk Prescription drug management.   This patient presents to the ED for concern of psychosis, this involves an extensive number of treatment options, and is a complaint that carries with it a high risk of complications and morbidity.  The differential diagnosis includes but is not limited to CVA, intracranial hemorrhage,renal failure, urinary tract infection, electrolyte disturbance, meningitis, intoxication    Comorbidities that complicate the patient evaluation: Patient's presentation is complicated by their history of schizophrenia  Social Determinants of Health: Patient's  substance use disorder   increases the complexity of managing their  presentation  Additional history obtained: Additional history obtained from  IVC paperwork Records reviewed previous admission documents  Lab Tests: I Ordered, and personally interpreted labs.  The pertinent results include: Labs overall unremarkable   Medicines ordered and prescription drug management: Home medications including antipsychotics have been ordered  Test Considered: I considered neuroimaging for his psychosis, patient has long history of schizophrenia with exacerbation   Consultations Obtained: I requested consultation with the consultant behavioral health , and discussed  findings as well as pertinent plan - they recommend: Admit  Reevaluation: After the interventions noted above, I reevaluated the patient and found that they have :stayed the same  Complexity of problems addressed: Patient's presentation is most consistent with  acute presentation with potential threat to life or bodily function  Disposition: After consideration of the diagnostic results and the patient's response to treatment,  I feel that the patent would benefit from admission   .  The patient has been placed in psychiatric observation due to the need to provide a safe environment for the patient while obtaining psychiatric consultation and evaluation, as well as ongoing medical and medication management to treat the patient's condition.  The patient has been placed under full IVC at this time.         Final Clinical Impression(s) / ED Diagnoses Final diagnoses:  Psychosis, unspecified psychosis type [facility])    Rx / DC Orders ED Discharge Orders     None         Zadie Rhine, MD 03/09/22 0004

## 2022-03-08 NOTE — ED Notes (Signed)
Pt given saltines and peanut butter and water

## 2022-03-08 NOTE — ED Notes (Signed)
Pt escorted to private area with sitter and sheriff dept for TTS
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
## 2022-03-08 NOTE — ED Notes (Signed)
Pt requesting something to eat.  

## 2022-03-09 DIAGNOSIS — F23 Brief psychotic disorder: Secondary | ICD-10-CM | POA: Diagnosis not present

## 2022-03-09 LAB — RAPID URINE DRUG SCREEN, HOSP PERFORMED
Amphetamines: POSITIVE — AB
Barbiturates: NOT DETECTED
Benzodiazepines: NOT DETECTED
Cocaine: NOT DETECTED
Opiates: POSITIVE — AB
Tetrahydrocannabinol: POSITIVE — AB

## 2022-03-09 MED ORDER — RISPERIDONE 1 MG PO TBDP
2.0000 mg | ORAL_TABLET | Freq: Three times a day (TID) | ORAL | Status: DC | PRN
  Administered 2022-03-09: 2 mg via ORAL
  Filled 2022-03-09: qty 2

## 2022-03-09 MED ORDER — ACETAMINOPHEN 500 MG PO TABS
1000.0000 mg | ORAL_TABLET | Freq: Once | ORAL | Status: AC
  Administered 2022-03-09: 1000 mg via ORAL
  Filled 2022-03-09: qty 2

## 2022-03-09 MED ORDER — IBUPROFEN 800 MG PO TABS: 800.0000 mg | ORAL_TABLET | Freq: Three times a day (TID) | ORAL | Status: DC | PRN

## 2022-03-09 MED ORDER — CYCLOBENZAPRINE HCL 10 MG PO TABS
10.0000 mg | ORAL_TABLET | Freq: Once | ORAL | Status: AC
  Administered 2022-03-09: 10 mg via ORAL
  Filled 2022-03-09: qty 1

## 2022-03-09 MED ORDER — ZIPRASIDONE MESYLATE 20 MG IM SOLR
20.0000 mg | INTRAMUSCULAR | Status: AC | PRN
  Administered 2022-03-09: 20 mg via INTRAMUSCULAR
  Filled 2022-03-09: qty 20

## 2022-03-09 MED ORDER — NICOTINE 21 MG/24HR TD PT24
21.0000 mg | MEDICATED_PATCH | Freq: Every day | TRANSDERMAL | Status: DC
  Administered 2022-03-09 – 2022-03-10 (×2): 21 mg via TRANSDERMAL
  Filled 2022-03-09 (×2): qty 1

## 2022-03-09 MED ORDER — STERILE WATER FOR INJECTION IJ SOLN
INTRAMUSCULAR | Status: AC
  Administered 2022-03-09: 1.2 mL
  Filled 2022-03-09: qty 10

## 2022-03-09 MED ORDER — LORAZEPAM 1 MG PO TABS: 1.0000 mg | ORAL_TABLET | ORAL | Status: DC | PRN

## 2022-03-09 NOTE — BH Assessment (Addendum)
Disposition Note:   Per Ricky Ala, NP, patient recommended for inpatient psychiatric treatment. Referred to [facility] for inpatient treatment. However, the [facility] (Antoinette Cillo, RN), confirmed no beds. Clinician faxed patient to alternative facilities for consideration of bed placement. See hospitals listed below:    Destination  Service Provider Request Status Selected Services Address Phone Fax Patient Preferred  [place] [address], [place] [address] [phone] --  [place] N/A [address] [place] [address] [phone] --  [place] N/A [address], [place] [address] [phone] --  [facility]  Pending - Request Sent N/A [address] [place] [address] [phone] --  [place], [place] [address] [phone] [address] [place] [address] [place] [phone] [address] [place] [address] [place] --  [facility]  Pending - Request Sent N/A [address] [place], [place] [address] [phone] --  [facility]  Pending - Request Sent N/A [address], [place] [address] [phone] --  [facility]  Pending - Request Sent N/A [address] [place] [address] [phone] --  [facility]  Pending - Request Sent N/A [address], [place] [address] [phone] --  [facility]  Pending - Request Sent N/A [address] [place] [phone] --   [place] N/A [address], [place] [address] [phone] --  [facility]  Pending - Request Sent N/A [place], [place] [address] [phone] --  [facility]  Pending - Request Sent N/A [address] [place] [address] [phone] --  [facility]  Pending - Request Sent N/A [address] [place] [address] [phone] --  [place] N/A [address], [place] [address] [phone] --  [facility]  Pending - Request Sent N/A [address] [place] [address] [phone] --  [facility]  Pending - Request Sent N/A [address], [place] [address] [phone] --  [facility]  Pending - Request Sent N/A [address] [place] [address] [phone] --  [place] N/A [address] [place]  [address] [phone] --  [facility]  Pending - Request Sent N/A [address], [place] [address] [phone] --  [facility]  Pending - Request Sent N/A [address], [place] [address] [phone] --  [place] N/A [address] [place], [place] [address] [phone] --

## 2022-03-09 NOTE — ED Notes (Signed)
Pt requesting to speak to dr concerning pain control. Pt given gabapentin and risperidone in the last 1.5 hours. Sabra Heck, MD notified and in department.

## 2022-03-09 NOTE — ED Notes (Signed)
Pt resting comfortably. Rise and fall of chest noted. Sitter at bedside. Ronald Corona Edd Fabian

## 2022-03-09 NOTE — ED Notes (Addendum)
Pt refused suboxone and trazadone- says they make him sick, pt says he takes oxy- Dr Christy Gentles made aware- no new orders received

## 2022-03-09 NOTE — ED Notes (Signed)
Pt told me his leg is jumping and he feels very anxious,wanted his shot of geodon but agreed to try trazodone and atarax instead.

## 2022-03-09 NOTE — ED Notes (Signed)
Pt has been calm and cooperative. He has been resting since Atarax admin.  Officers were relieved of their duty. Shackles removed. [place] officer informed. Sitter remains at bedside. Ronald Hoffman

## 2022-03-09 NOTE — ED Notes (Signed)
Pt care taken, resting, eyes closed, no complaints at this time.

## 2022-03-09 NOTE — ED Notes (Signed)
PT agitated and yelling out of his room. Ronald Hoffman

## 2022-03-09 NOTE — Consult Note (Signed)
[facility] Psych ED Progress Note  03/09/2022 11:58 AM Ronald Hoffman  MRN:  [id number]  Principal Problem: Acute psychosis (HCC) Diagnosis:  Principal Problem:   Acute psychosis (HCC)   Subjective:  " Im alright."  Ronald Hoffman seen and evaluated via teleassessment. Safety sitter at bedside/door. Ronald Hoffman present delusional and with paranoid ideations. " I know they are watching me."  Does report he was prescribed olanzapine however has not had medication in quite some time.  Patient is unable to recall reason for this admission.  Stated " I missed the nurse's and they missed me."  He reported that he resides with his parents. Denied auditory or visual hallucinations.  Chart reviewed UDS positive for opiates, amphetamines and marijuana.   During this assessment patient noted to be thought blocking noted very slow to respond.  It was reported patient is actively responding to internal stimuli.  Chart reviewed patient is currently prescribed Seroquel 200 mg nightly Risperdal 2 mg for agitation.  Requiring Geodon 20 mg for agitation protocol.  Will continue to recommend inpatient admission once medically cleared.    Per admisison assessmetn note:  "Pt is a 42 year old male who presents unaccompanied to [facility] ED via Patent examiner after being petitioned for involuntary commitment by his father. Ronald Hoffman [phone]. Affidavit and petition states: "Respondent is currently at home saying demons and KKK are killing his kids. He is possibly hearing voices. Respondent will talk back to the voices as well. He thinks voices are coming through the Lexicographer. "   ED Assessment Time Calculation: No data recorded  Past Psychiatric History:   [place] Scale:  Flowsheet Row ED from 03/08/2022 in [facility] Emergency Department at [facility] Admission (Discharged) from 11/13/2021 in [facility] INPATIENT BEHAV[facility]AL [facility]ICINE ED to Hosp-Admission (Discharged) from 11/08/2021 in [facility] MEDICAL  SURGICAL UNIT  C-SSRS RISK CATEGORY High Risk No Risk Low Risk       Past Medical History:  Past Medical History:  Diagnosis Date   Acute psychosis (HCC) 10/26/2019   Anxiety    Delusional disorder (HCC) 10/25/2019   Drug abuse (HCC) 03/19/2020   Hyperthyroidism    Lactic acidosis 02/21/2021   Seizures (HCC)    stress related; no more seizures since first one 10 years ago; on no meds.   Sepsis (HCC)    SIRS (systemic inflammatory response syndrome) (HCC) 02/20/2021   Substance-induced psychotic disorder (HCC) 03/19/2020   Tachycardia     Past Surgical History:  Procedure Laterality Date   HEMORRHOID SURGERY N/A 11/12/2015   Procedure: EXTENSIVE HEMORRHOIDECTOMY;  Surgeon: Franky Macho, MD;  Location: [facility] ORS;  Service: General;  Laterality: N/A;   Family History: No family history on file. Family Psychiatric  History:  Social History:  Social History   Substance and Sexual Activity  Alcohol Use Yes   Comment: last drink 10/25/2019, pt said he had 3 beers     Social History   Substance and Sexual Activity  Drug Use Yes   Types: Heroin, Methamphetamines, Other-see comments   Comment: was using synthetic heroin daily for 2 years but stopped 2 months ago, methamphetamine daily for 3 months until June (last use 10/25/2019- "just a pinch"), "mushrooms yesterday," and "ketamine today"    Social History   Socioeconomic History   Marital status: Single    Spouse name: Not on file   Number of children: Not on file   Years of education: Not on file   Highest education level:  Not on file  Occupational History   Not on file  Tobacco Use   Smoking status: Former    Packs/day: 0.50    Years: 10.00    Total pack years: 5.00    Types: Cigarettes   Smokeless tobacco: Never  Vaping Use   Vaping Use: Every day  Substance and Sexual Activity   Alcohol use: Yes    Comment: last drink 10/25/2019, pt said he had 3 beers   Drug use: Yes    Types: Heroin, Methamphetamines, Other-see  comments    Comment: was using synthetic heroin daily for 2 years but stopped 2 months ago, methamphetamine daily for 3 months until June (last use 10/25/2019- "just a pinch"), "mushrooms yesterday," and "ketamine today"   Sexual activity: Never    Birth control/protection: None  Other Topics Concern   Not on file  Social History Narrative   Not on file   Social Determinants of Health   Financial Resource Strain: Not on file  Food Insecurity: No Food Insecurity (11/13/2021)   Hunger Vital Sign    Worried About Running Out of Food in the Last Year: Never true    Ran Out of Food in the Last Year: Never true  Transportation Needs: No Transportation Needs (11/13/2021)   PRAPARE - Hydrologist (Medical): No    Lack of Transportation (Non-Medical): No  Physical Activity: Not on file  Stress: Not on file  Social Connections: Not on file    Sleep: NA  Appetite:  Fair  Current Medications: Current Facility-Administered Medications  Medication Dose Route Frequency Provider Last Rate Last Admin   benztropine (COGENTIN) tablet 0.5 mg  0.5 mg Oral BID Ripley Fraise, MD   0.5 mg at 03/09/22 1102   gabapentin (NEURONTIN) capsule 600 mg  600 mg Oral TID Ripley Fraise, MD   600 mg at 03/09/22 1102   hydrOXYzine (ATARAX) tablet 50 mg  50 mg Oral Q6H PRN Ripley Fraise, MD   50 mg at 03/09/22 0129   risperiDONE (RISPERDAL M-TABS) disintegrating tablet 2 mg  2 mg Oral Q8H PRN Ripley Fraise, MD   2 mg at 03/09/22 1134   And   LORazepam (ATIVAN) tablet 1 mg  1 mg Oral PRN Ripley Fraise, MD       QUEtiapine (SEROQUEL) tablet 200 mg  200 mg Oral QHS Ripley Fraise, MD   200 mg at 03/09/22 0021   traZODone (DESYREL) tablet 100 mg  100 mg Oral QHS PRN Ripley Fraise, MD       Current Outpatient Medications  Medication Sig Dispense Refill   benztropine (COGENTIN) 0.5 MG tablet Take 1 tablet (0.5 mg total) by mouth 2 (two) times daily. 60 tablet 0    buprenorphine-naloxone (SUBOXONE) 8-2 mg SUBL SL tablet Place 1 tablet under the tongue daily. 60 tablet 0   gabapentin (NEURONTIN) 300 MG capsule Take 2 capsules (600 mg total) by mouth 3 (three) times daily. 180 capsule 0   hydrOXYzine (ATARAX) 50 MG tablet Take 1 tablet (50 mg total) by mouth every 6 (six) hours as needed for anxiety. 60 tablet 0   methocarbamol (ROBAXIN) 500 MG tablet Take 1 tablet (500 mg total) by mouth 3 (three) times daily. 90 tablet 0   pantoprazole (PROTONIX) 40 MG tablet Take 1 tablet (40 mg total) by mouth 2 (two) times daily. 60 tablet 0   QUEtiapine (SEROQUEL) 200 MG tablet Take 1 tablet (200 mg total) by mouth at bedtime. 30 tablet 0  thiamine (VITAMIN B1) 100 MG tablet Take 1 tablet (100 mg total) by mouth daily. 30 tablet 0   traZODone (DESYREL) 100 MG tablet Take 1 tablet (100 mg total) by mouth at bedtime as needed for sleep. 30 tablet 0    Lab Results:  Results for orders placed or performed during the hospital encounter of 03/08/22 (from the past 48 hour(s))  Comprehensive metabolic panel     Status: Abnormal   Collection Time: 03/08/22 10:24 PM  Result Value Ref Range   Sodium 137 135 - 145 mmol/L   Potassium 3.4 (L) 3.5 - 5.1 mmol/L   Chloride 104 98 - 111 mmol/L   CO2 22 22 - 32 mmol/L   Glucose, Bld 109 (H) 70 - 99 mg/dL    Comment: Glucose reference range applies only to samples taken after fasting for at least 8 hours.   BUN 14 6 - 20 mg/dL   Creatinine, Ser 0.74 0.61 - 1.24 mg/dL   Calcium 9.6 8.9 - 10.3 mg/dL   Total Protein 8.1 6.5 - 8.1 g/dL   Albumin 4.8 3.5 - 5.0 g/dL   AST 27 15 - 41 U/L   ALT 29 0 - 44 U/L   Alkaline Phosphatase 103 38 - 126 U/L   Total Bilirubin 0.6 0.3 - 1.2 mg/dL   GFR, Estimated >60 >60 mL/min    Comment: (NOTE) Calculated using the CKD-EPI Creatinine Equation (2021)    Anion gap 11 5 - 15    Comment: Performed at [facility], [address], [place], [place] [address]  Ethanol     Status: None   Collection  Time: 03/08/22 10:24 PM  Result Value Ref Range   Alcohol, Ethyl (B) <10 <10 mg/dL    Comment: (NOTE) Lowest detectable limit for serum alcohol is 10 mg/dL.  For medical purposes only. Performed at [facility], [place] [place], [place] [id number]   Salicylate level     Status: Abnormal   Collection Time: 03/08/22 10:24 PM  Result Value Ref Range   Salicylate Lvl Q000111Q (L) 7.0 - 30.0 mg/dL    Comment: Performed at [facility], [address] [place], [place] [address]  Acetaminophen level     Status: Abnormal   Collection Time: 03/08/22 10:24 PM  Result Value Ref Range   Acetaminophen (Tylenol), Serum <10 (L) 10 - 30 ug/mL    Comment: (NOTE) Therapeutic concentrations vary significantly. A range of 10-30 ug/mL  may be an effective concentration for many patients. However, some  are best treated at concentrations outside of this range. Acetaminophen concentrations >150 ug/mL at 4 hours after ingestion  and >50 ug/mL at 12 hours after ingestion are often associated with  toxic reactions.  Performed at [facility], [address] [place], [place] [address]   cbc     Status: None   Collection Time: 03/08/22 10:24 PM  Result Value Ref Range   WBC 10.0 4.0 - 10.5 K/uL   RBC 5.15 4.22 - 5.81 MIL/uL   Hemoglobin 14.6 13.0 - 17.0 g/dL   HCT 42.0 39.0 - 52.0 %   MCV 81.6 80.0 - 100.0 fL   MCH 28.3 26.0 - 34.0 pg   MCHC 34.8 30.0 - 36.0 g/dL   RDW 13.1 11.5 - 15.5 %   Platelets 339 150 - 400 K/uL   nRBC 0.0 0.0 - 0.2 %    Comment: Performed at [facility], [address], [place], [place] [address]  Rapid urine  drug screen (hospital performed)     Status: Abnormal   Collection Time: 03/09/22 10:52 AM  Result Value Ref Range   Opiates POSITIVE (A) NONE DETECTED   Cocaine NONE DETECTED NONE DETECTED   Benzodiazepines NONE DETECTED NONE DETECTED   Amphetamines POSITIVE (A) NONE DETECTED   Tetrahydrocannabinol POSITIVE (A) NONE DETECTED    Barbiturates NONE DETECTED NONE DETECTED    Comment: (NOTE) DRUG SCREEN FOR MEDICAL PURPOSES ONLY.  IF CONFIRMATION IS NEEDED FOR ANY PURPOSE, NOTIFY LAB WITHIN 5 DAYS.  LOWEST DETECTABLE LIMITS FOR URINE DRUG SCREEN Drug Class                     Cutoff (ng/mL) Amphetamine and metabolites    1000 Barbiturate and metabolites    200 Benzodiazepine                 200 Opiates and metabolites        300 Cocaine and metabolites        300 THC                            50 Performed at [facility], [address], [place], [place] [address]     Blood Alcohol level:  Lab Results  Component Value Date   [facility] <10 03/08/2022   ETH <10 02/20/2021    Physical Findings:  CIWA:    COWS:     Musculoskeletal: Strength & Muscle Tone: within normal limits Gait & Station: normal Patient leans: N/A  Psychiatric Specialty Exam:  Presentation  General Appearance:  Appropriate for Environment; Casual; Fairly Groomed  Eye Contact: Fair  Speech: Clear and Coherent; Normal Rate  Speech Volume: Normal  Handedness: Right   Mood and Affect  Mood: Anxious; Dysphoric  Affect: Congruent; Constricted   Thought Process  Thought Processes: Coherent; Disorganized  Descriptions of Associations:Loose  Orientation:Partial  Thought Content:Illogical; Scattered  History of Schizophrenia/Schizoaffective disorder:Yes  Duration of Psychotic Symptoms:Greater than six months  Hallucinations:No data recorded Ideas of Reference:Delusions  Suicidal Thoughts:No data recorded Homicidal Thoughts:No data recorded  Sensorium  Memory: Immediate Fair  Judgment: Impaired  Insight: Lacking   Executive Functions  Concentration: Fair  Attention Span: Fair  Recall: Poor  Fund of Knowledge: Fair  Language: Fair   Psychomotor Activity  Psychomotor Activity:No data recorded  Assets  Assets: Communication Skills; Physical Health; Social Support   Sleep  Sleep:No  data recorded   Physical Exam: Physical Exam Vitals reviewed.  Neurological:     Mental Status: He is oriented to person, place, and time.  Psychiatric:        Mood and Affect: Mood normal.        Behavior: Behavior normal.    ROS Blood pressure 124/74, pulse (!) 111, temperature 98 F (36.7 C), temperature source Oral, resp. rate 17, height 5\' 7"  (1.702 m), weight 78 kg, SpO2 100 %. Body mass index is 26.94 kg/m.   Medical Decision Making:  Continue seeking inpatient admission Continue Seroquel 200 mg p.o. nightly See chart for agitation orders  Derrill [place], NP 03/09/2022, 11:58 AM

## 2022-03-10 DIAGNOSIS — F23 Brief psychotic disorder: Secondary | ICD-10-CM

## 2022-03-10 MED ORDER — RISPERIDONE 1 MG PO TABS: 1.5000 mg | ORAL_TABLET | Freq: Every day | ORAL | 0 refills | Status: AC

## 2022-03-10 MED ORDER — QUETIAPINE FUMARATE 200 MG PO TABS: 200.0000 mg | ORAL_TABLET | Freq: Every day | ORAL | 0 refills | Status: AC

## 2022-03-10 NOTE — ED Notes (Signed)
Pt received breakfast tray 

## 2022-03-10 NOTE — ED Notes (Signed)
Pt belongings returned back to patient

## 2022-03-10 NOTE — Consult Note (Addendum)
Telepsych Consultation   Reason for Consult: Acute psychosis Referring Physician:  EPD Location of Patient: [id number] Location of Provider: Other: [facility]  Patient Identification: RONDY KRUPINSKI MRN:  [id number] Principal Diagnosis: Acute psychosis [facility]) Diagnosis:  Principal Problem:   Acute psychosis ([place])   Total Time spent with patient: 15 minutes  Subjective:   JED KUTCH is a 42 y.o. male presents under involuntary commitment due to altered mental status/psychosis.  UDS positive for opiates, amphetamines and marijuana on admission.  Patient was seen and evaluated for his mood and thought process appears to be improving.  He continues to deny suicidal or homicidal ideations.  Denies auditory or visual hallucinations.  NP spoke to patient's mother who reports patient has been noncompliant with medication or follow-up with [facility].  Does report some confusion and delusional thoughts at baseline.  She denied any safety concerns with patient returning home.  Discussed current medication regimen to Seroquel 200 mg nightly and Risperdal 1.5 mg daily.  Support, encouragement and reassurance was provided.  Case staffed with attending psychiatrist and treatment team.  Patient to keep all outpatient follow-up appointments.    HPI:  per admission assessment note: "Pt is a 42 year old male who presents unaccompanied to [facility] ED via [facility] after being petitioned for involuntary commitment by his father. Marissa Nestle Quin [phone]. Affidavit and petition states: "Respondent is currently at home saying demons and KKK are killing his kids. He is possibly hearing voices. Respondent will talk back to the voices as well. He thinks voices are coming through the Garment/textile technologist. "   Past Psychiatric History: Patient has a charted history with schizophrenia, substance induced psychotic disorder and suicidal ideations Recent inpatient admission where he was prescribed  olanzapine and Topamax.   Risk to Self:   Risk to Others:   Prior Inpatient Therapy:   Prior Outpatient Therapy:    Past Medical History:  Past Medical History:  Diagnosis Date   Acute psychosis ([place]) 10/26/2019   Anxiety    Delusional disorder ([place]) 10/25/2019   Drug abuse ([place]) 03/19/2020   Hyperthyroidism    Lactic acidosis 02/21/2021   Seizures ([place])    stress related; no more seizures since first one 10 years ago; on no meds.   Sepsis ([place])    SIRS (systemic inflammatory response syndrome) ([place]) 02/20/2021   Substance-induced psychotic disorder ([place]) 03/19/2020   Tachycardia     Past Surgical History:  Procedure Laterality Date   HEMORRHOID SURGERY N/A 11/12/2015   Procedure: EXTENSIVE HEMORRHOIDECTOMY;  Surgeon: Aviva Signs, MD;  Location: [facility];  Service: General;  Laterality: N/A;   Family History: No family history on file. Family Psychiatric  History:  Social History:  Social History   Substance and Sexual Activity  Alcohol Use Yes   Comment: last drink 10/25/2019, pt said he had 3 beers     Social History   Substance and Sexual Activity  Drug Use Yes   Types: Heroin, Methamphetamines, Other-see comments   Comment: was using synthetic heroin daily for 2 years but stopped 2 months ago, methamphetamine daily for 3 months until June (last use 10/25/2019- "just a pinch"), "mushrooms yesterday," and "ketamine today"    Social History   Socioeconomic History   Marital status: Single    Spouse name: Not on file   Number of children: Not on file   Years of education: Not on file   Highest education level: Not on file  Occupational History  Not on file  Tobacco Use   Smoking status: Former    Packs/day: 0.50    Years: 10.00    Total pack years: 5.00    Types: Cigarettes   Smokeless tobacco: Never  Vaping Use   Vaping Use: Every day  Substance and Sexual Activity   Alcohol use: Yes    Comment: last drink 10/25/2019, pt said he had 3 beers   Drug use: Yes     Types: Heroin, Methamphetamines, Other-see comments    Comment: was using synthetic heroin daily for 2 years but stopped 2 months ago, methamphetamine daily for 3 months until June (last use 10/25/2019- "just a pinch"), "mushrooms yesterday," and "ketamine today"   Sexual activity: Never    Birth control/protection: None  Other Topics Concern   Not on file  Social History Narrative   Not on file   Social Determinants of Health   Financial Resource Strain: Not on file  Food Insecurity: No Food Insecurity (11/13/2021)   Hunger Vital Sign    Worried About Running Out of Food in the Last Year: Never true    Ran Out of Food in the Last Year: Never true  Transportation Needs: No Transportation Needs (11/13/2021)   PRAPARE - Administrator, Civil Service (Medical): No    Lack of Transportation (Non-Medical): No  Physical Activity: Not on file  Stress: Not on file  Social Connections: Not on file   Additional Social History:    Allergies:   Allergies  Allergen Reactions   Hydrocodone Itching   Penicillins Other (See Comments)    Unknown childhood allergy Has patient had a PCN reaction causing immediate rash, facial/tongue/throat swelling, SOB or lightheadedness with hypotension: unknown Has patient had a PCN reaction causing severe rash involving mucus membranes or skin necrosis: unknown Has patient had a PCN reaction that required hospitalization: unknown Has patient had a PCN reaction occurring within the last 10 years:no If all of the above answers are "NO", then may proceed with Cephalosporin use.     Labs:  Results for orders placed or performed during the hospital encounter of 03/08/22 (from the past 48 hour(s))  Comprehensive metabolic panel     Status: Abnormal   Collection Time: 03/08/22 10:24 PM  Result Value Ref Range   Sodium 137 135 - 145 mmol/L   Potassium 3.4 (L) 3.5 - 5.1 mmol/L   Chloride 104 98 - 111 mmol/L   CO2 22 22 - 32 mmol/L   Glucose,  Bld 109 (H) 70 - 99 mg/dL    Comment: Glucose reference range applies only to samples taken after fasting for at least 8 hours.   BUN 14 6 - 20 mg/dL   Creatinine, Ser 5.73 0.61 - 1.24 mg/dL   Calcium 9.6 8.9 - 22.0 mg/dL   Total Protein 8.1 6.5 - 8.1 g/dL   Albumin 4.8 3.5 - 5.0 g/dL   AST 27 15 - 41 U/L   ALT 29 0 - 44 U/L   Alkaline Phosphatase 103 38 - 126 U/L   Total Bilirubin 0.6 0.3 - 1.2 mg/dL   GFR, Estimated >25 >42 mL/min    Comment: (NOTE) Calculated using the CKD-EPI Creatinine Equation (2021)    Anion gap 11 5 - 15    Comment: Performed at [facility], [address] [place], [place] [address]  Ethanol     Status: None   Collection Time: 03/08/22 10:24 PM  Result Value Ref Range   Alcohol, Ethyl (B) <10 <  10 mg/dL    Comment: (NOTE) Lowest detectable limit for serum alcohol is 10 mg/dL.  For medical purposes only. Performed at [facility], [address] [place], [place] [address]   Salicylate level     Status: Abnormal   Collection Time: 03/08/22 10:24 PM  Result Value Ref Range   Salicylate Lvl <7.0 (L) 7.0 - 30.0 mg/dL    Comment: Performed at [facility], [address] [place], [place] [address]  Acetaminophen level     Status: Abnormal   Collection Time: 03/08/22 10:24 PM  Result Value Ref Range   Acetaminophen (Tylenol), Serum <10 (L) 10 - 30 ug/mL    Comment: (NOTE) Therapeutic concentrations vary significantly. A range of 10-30 ug/mL  may be an effective concentration for many patients. However, some  are best treated at concentrations outside of this range. Acetaminophen concentrations >150 ug/mL at 4 hours after ingestion  and >50 ug/mL at 12 hours after ingestion are often associated with  toxic reactions.  Performed at [facility], [address] [place], [place] [address]   cbc     Status: None   Collection Time: 03/08/22 10:24 PM  Result Value Ref Range   WBC 10.0 4.0 - 10.5 K/uL   RBC 5.15 4.22 - 5.81 MIL/uL    Hemoglobin 14.6 13.0 - 17.0 g/dL   HCT 11.0 31.5 - 94.5 %   MCV 81.6 80.0 - 100.0 fL   MCH 28.3 26.0 - 34.0 pg   MCHC 34.8 30.0 - 36.0 g/dL   RDW 85.9 29.2 - 44.6 %   Platelets 339 150 - 400 K/uL   nRBC 0.0 0.0 - 0.2 %    Comment: Performed at [facility], [address], [place], [place] [address]  Rapid urine drug screen (hospital performed)     Status: Abnormal   Collection Time: 03/09/22 10:52 AM  Result Value Ref Range   Opiates POSITIVE (A) NONE DETECTED   Cocaine NONE DETECTED NONE DETECTED   Benzodiazepines NONE DETECTED NONE DETECTED   Amphetamines POSITIVE (A) NONE DETECTED   Tetrahydrocannabinol POSITIVE (A) NONE DETECTED   Barbiturates NONE DETECTED NONE DETECTED    Comment: (NOTE) DRUG SCREEN FOR MEDICAL PURPOSES ONLY.  IF CONFIRMATION IS NEEDED FOR ANY PURPOSE, NOTIFY LAB WITHIN 5 DAYS.  LOWEST DETECTABLE LIMITS FOR URINE DRUG SCREEN Drug Class                     Cutoff (ng/mL) Amphetamine and metabolites    1000 Barbiturate and metabolites    200 Benzodiazepine                 200 Opiates and metabolites        300 Cocaine and metabolites        300 THC                            50 Performed at [facility], [address] [place], [place] [address]     Medications:  Current Facility-Administered Medications  Medication Dose Route Frequency Provider Last Rate Last Admin   benztropine (COGENTIN) tablet 0.5 mg  0.5 mg Oral BID Zadie Rhine, MD   0.5 mg at 03/10/22 6579   gabapentin (NEURONTIN) capsule 600 mg  600 mg Oral TID Zadie Rhine, MD   600 mg at 03/10/22 0383   hydrOXYzine (ATARAX) tablet 50 mg  50 mg Oral Q6H PRN Zadie Rhine, MD  50 mg at 03/09/22 2332   ibuprofen (ADVIL) tablet 800 mg  800 mg Oral Q8H PRN Vanetta Mulders, MD       risperiDONE (RISPERDAL M-TABS) disintegrating tablet 2 mg  2 mg Oral Q8H PRN Zadie Rhine, MD   2 mg at 03/09/22 1134   And   LORazepam (ATIVAN) tablet 1 mg  1 mg Oral PRN Zadie Rhine, MD        nicotine (NICODERM CQ - dosed in mg/24 hours) patch 21 mg  21 mg Transdermal Daily Vanetta Mulders, MD   21 mg at 03/10/22 7322   QUEtiapine (SEROQUEL) tablet 200 mg  200 mg Oral QHS Zadie Rhine, MD   200 mg at 03/09/22 2114   traZODone (DESYREL) tablet 100 mg  100 mg Oral QHS PRN Zadie Rhine, MD   100 mg at 03/09/22 2332   Current Outpatient Medications  Medication Sig Dispense Refill   gabapentin (NEURONTIN) 300 MG capsule Take 2 capsules (600 mg total) by mouth 3 (three) times daily. 180 capsule 0   methocarbamol (ROBAXIN) 500 MG tablet Take 1 tablet (500 mg total) by mouth 3 (three) times daily. 90 tablet 0   OLANZapine (ZYPREXA) 10 MG tablet Take 10 mg by mouth at bedtime.     benztropine (COGENTIN) 0.5 MG tablet Take 1 tablet (0.5 mg total) by mouth 2 (two) times daily. (Patient not taking: Reported on 03/09/2022) 60 tablet 0   buprenorphine-naloxone (SUBOXONE) 8-2 mg SUBL SL tablet Place 1 tablet under the tongue daily. (Patient not taking: Reported on 03/09/2022) 60 tablet 0   hydrOXYzine (ATARAX) 50 MG tablet Take 1 tablet (50 mg total) by mouth every 6 (six) hours as needed for anxiety. (Patient not taking: Reported on 03/09/2022) 60 tablet 0   pantoprazole (PROTONIX) 40 MG tablet Take 1 tablet (40 mg total) by mouth 2 (two) times daily. (Patient not taking: Reported on 03/09/2022) 60 tablet 0   QUEtiapine (SEROQUEL) 200 MG tablet Take 1 tablet (200 mg total) by mouth at bedtime. (Patient not taking: Reported on 03/09/2022) 30 tablet 0   thiamine (VITAMIN B1) 100 MG tablet Take 1 tablet (100 mg total) by mouth daily. (Patient not taking: Reported on 03/09/2022) 30 tablet 0   traZODone (DESYREL) 100 MG tablet Take 1 tablet (100 mg total) by mouth at bedtime as needed for sleep. (Patient not taking: Reported on 03/09/2022) 30 tablet 0    Musculoskeletal: Strength & Muscle Tone: within normal limits Gait & Station: normal Patient leans: N/A    Psychiatric Specialty  Exam:  Presentation  General Appearance:  Casual  Eye Contact: Good  Speech: Clear and Coherent  Speech Volume: Normal  Handedness: Left   Mood and Affect  Mood: Euthymic  Affect: Appropriate; Congruent   Thought Process  Thought Processes: Coherent  Descriptions of Associations:Intact  Orientation:Full (Time, Place and Person)  Thought Content:Rumination  History of Schizophrenia/Schizoaffective disorder:No  Duration of Psychotic Symptoms:Less than six months  Hallucinations:Hallucinations: None  Ideas of Reference:None  Suicidal Thoughts:Suicidal Thoughts: No  Homicidal Thoughts:Homicidal Thoughts: No   Sensorium  Memory: Immediate Fair; Recent Fair; Remote Fair  Judgment: Fair  Insight: Poor   Executive Functions  Concentration: Fair  Attention Span: Fair  Recall: Fair  Fund of Knowledge: Fair  Language: Fair   Psychomotor Activity  Psychomotor Activity:Psychomotor Activity: Normal   Assets  Assets: Social Support; Desire for Improvement   Sleep  Sleep:Sleep: Fair    Physical Exam: Physical Exam Vitals and nursing note reviewed.  Cardiovascular:  Rate and Rhythm: Normal rate and regular rhythm.  Neurological:     Mental Status: He is alert.  Psychiatric:        Mood and Affect: Mood normal.        Thought Content: Thought content normal.    Review of Systems  Respiratory: Negative.    Cardiovascular: Negative.   Psychiatric/Behavioral:  Positive for substance abuse.   All other systems reviewed and are negative.  Blood pressure 114/82, pulse 86, temperature 98 F (36.7 C), temperature source Oral, resp. rate 18, height 5\' 7"  (1.702 m), weight 78 kg, SpO2 100 %. Body mass index is 26.94 kg/m.  Treatment Plan Summary: Daily contact with patient to assess and evaluate symptoms and progress in treatment   Will make 2 weeks medications available at discharge    Disposition: No evidence of imminent  risk to self or others at present.   Supportive therapy provided about ongoing stressors. Refer to IOP. Discussed crisis plan, support from social network, calling 911, coming to the Emergency Department, and calling Suicide Hotline.  This service was provided via telemedicine using a 2-way, interactive audio and video technology.  Names of all persons participating in this telemedicine service and their role in this encounter. Name: Kobi Mario Role: Patient   Name: T.Berlyn Malina  Role: NP          Derrill Center, NP 03/10/2022 10:06 AM

## 2022-03-10 NOTE — Discharge Instructions (Signed)
Follow-up at [facility] as instructed by behavioral health

## 2022-03-12 IMAGING — DX DG CHEST 1V PORT
1 series · 1 of 1 positions shown · non-contrast
Comparison: None.

CLINICAL DATA: Chest pain.

EXAM:
PORTABLE CHEST 1 VIEW

[chest ap]
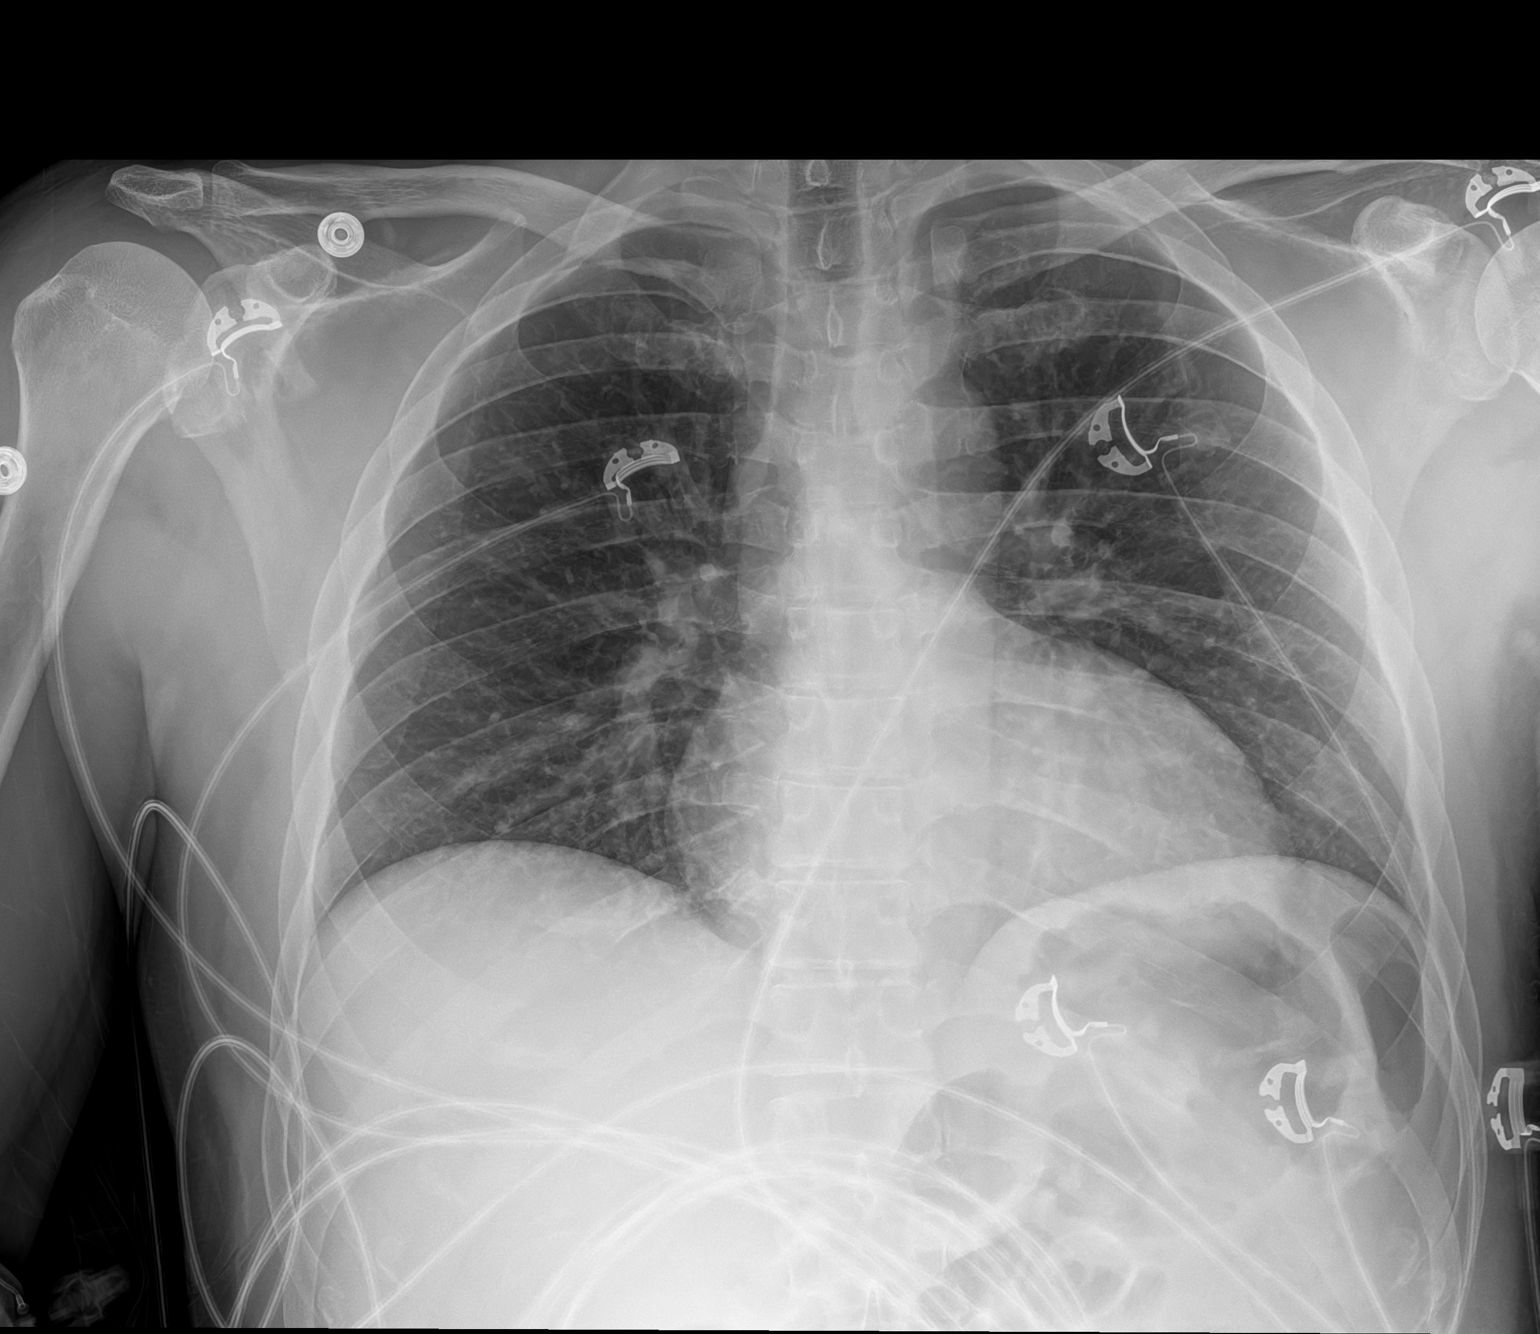

[1 of 1 positions shown; findings below may reference images not displayed]

FINDINGS: The heart size and mediastinal contours are within normal limits.
There is a 5 mm nodular density projecting over the left lower lung.
The lungs are otherwise clear. No acute fractures are seen.
IMPRESSION: 1. No acute cardiopulmonary process.
2. Nodular density projecting over the left lower lung,
indeterminate. Recommend follow-up nonemergent chest CT.

## 2022-03-13 IMAGING — US US EXTREM LOW VENOUS
1 series · 13 of 24 positions shown · non-contrast
Comparison: Chest XR, concurrent.

CLINICAL DATA: Bilateral lower extremity pain.



[Series 1: us venous img lower bilat (dvt) · portal-venous · 13 of 82 slices shown]
[im 1/82]
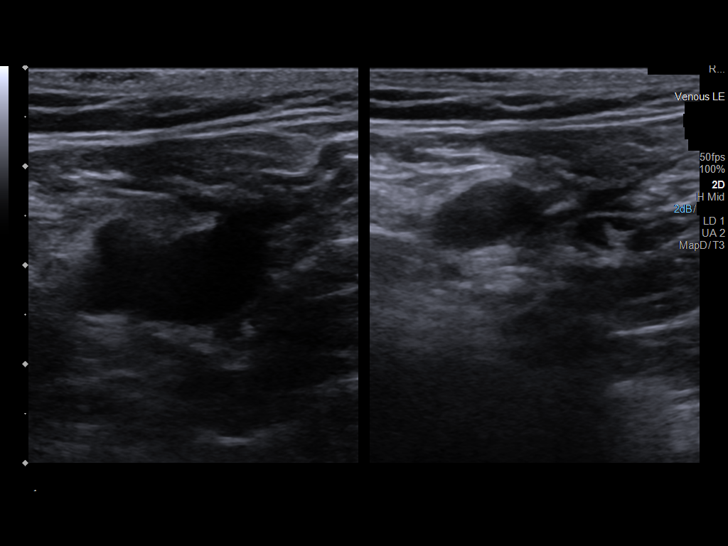
[im 8/82]
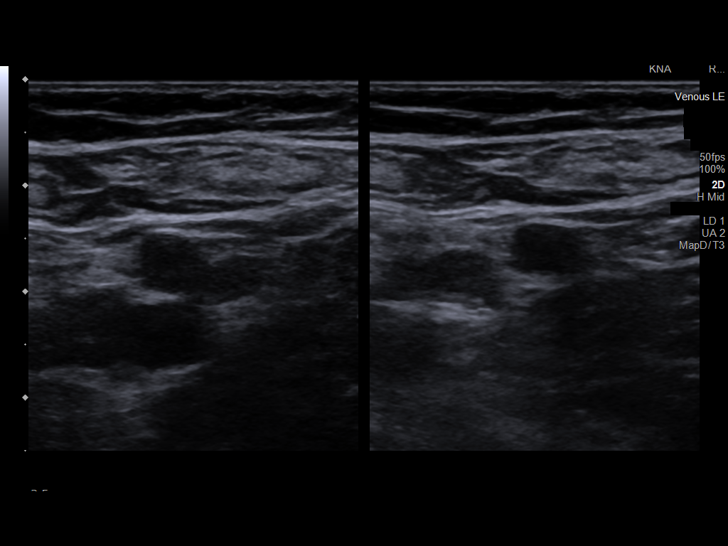
[im 15/82]
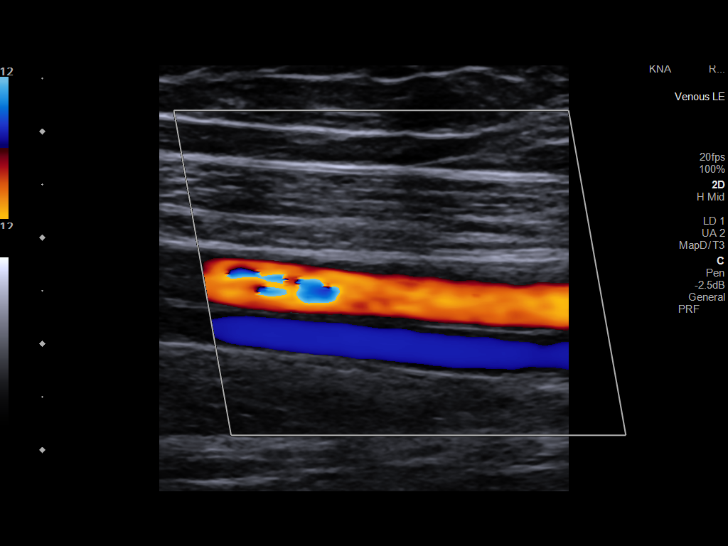
[im 22/82]
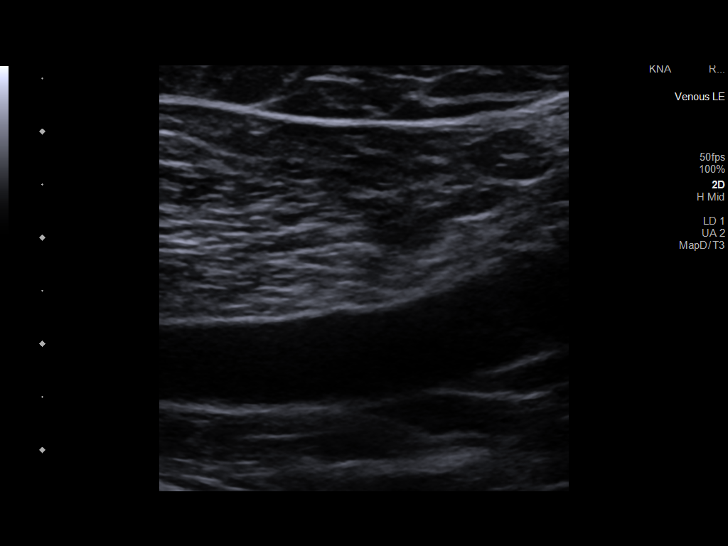
[im 29/82]
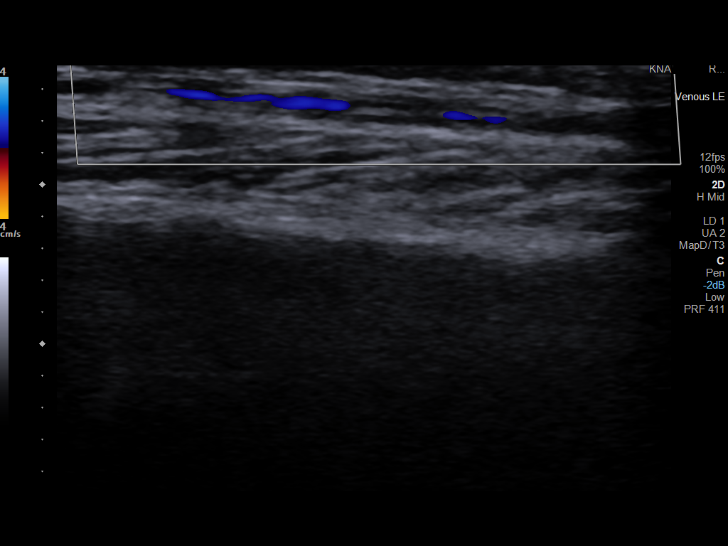
[im 36/82]
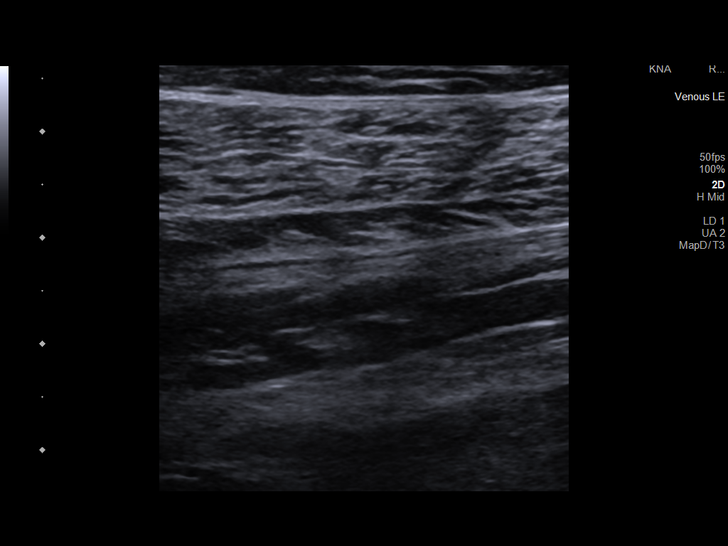
[im 43/82]
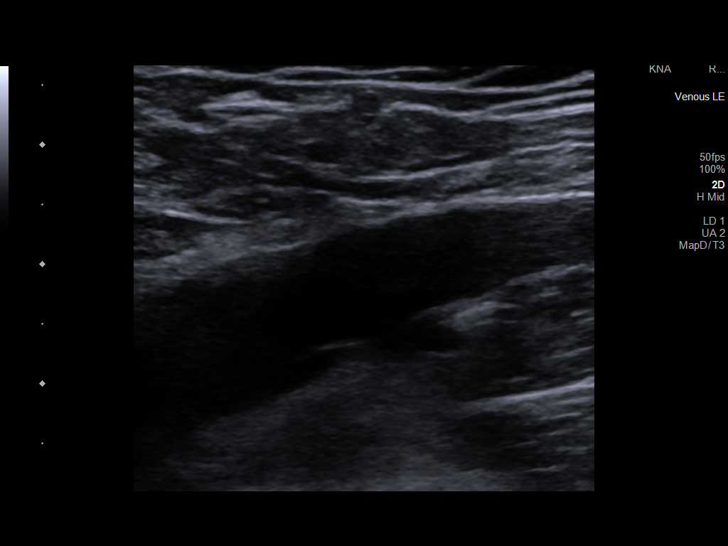
[im 46/82]
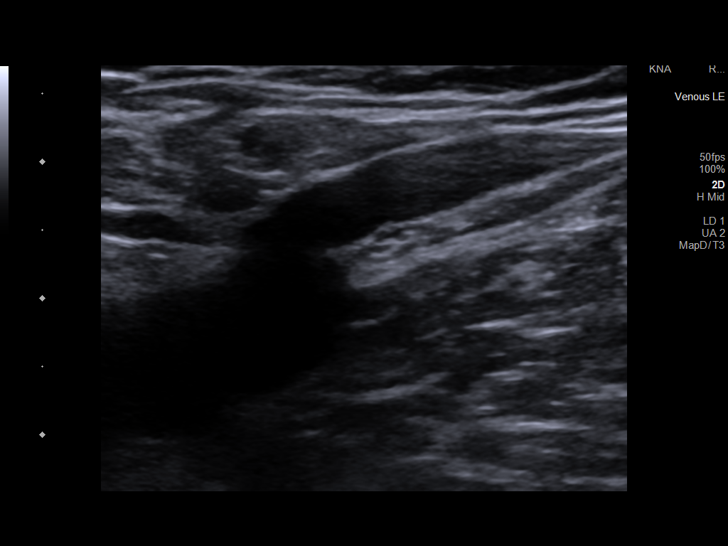
[im 53/82]
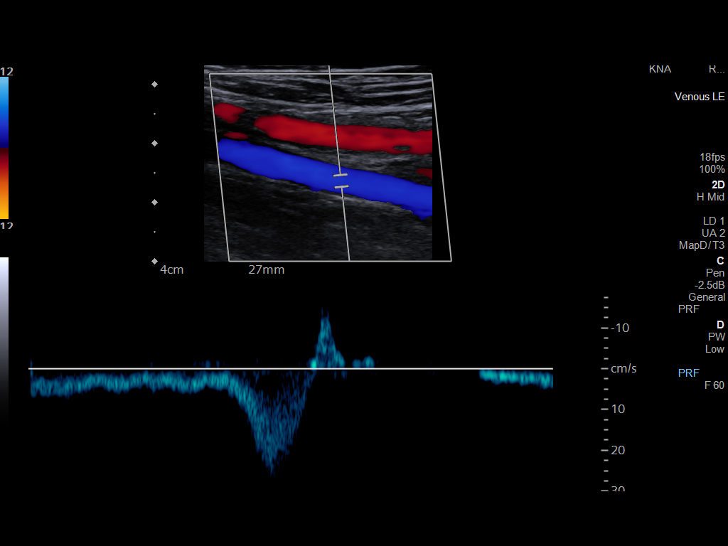
[im 60/82]
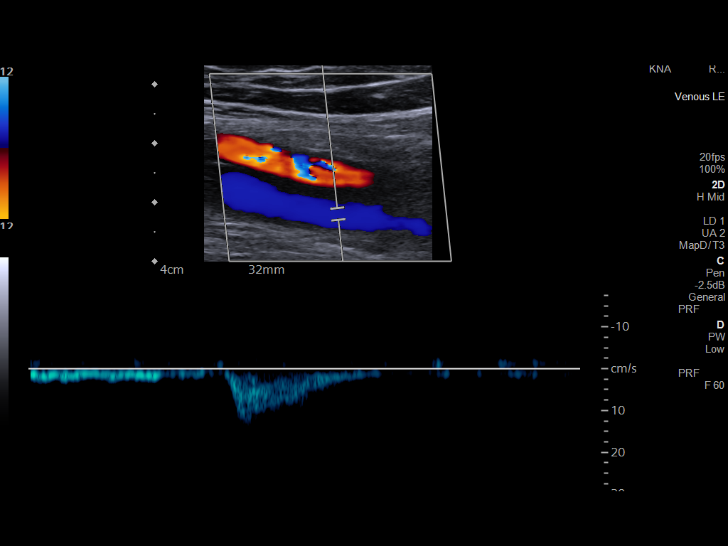
[im 67/82]
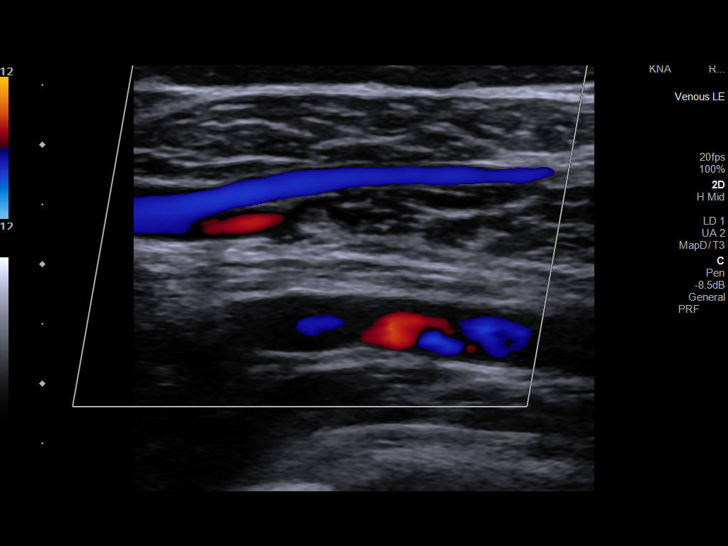
[im 74/82]
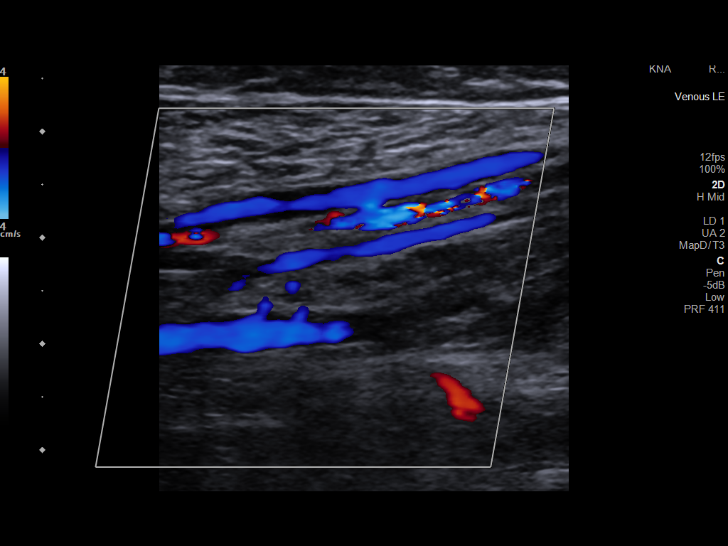
[im 82/82]
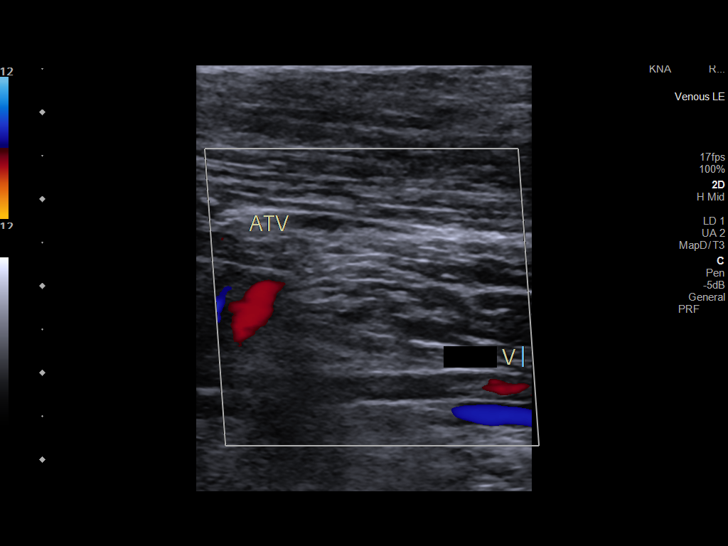

[13 of 24 positions shown; findings below may reference images not displayed]

FINDINGS: RIGHT LOWER EXTREMITY

VENOUS

Normal compressibility of the RIGHT common femoral, superficial
femoral, and popliteal veins, as well as the visualized calf veins.
Visualized portions of profunda femoral vein and great saphenous
vein unremarkable. No filling defects to suggest DVT on grayscale or
color Doppler imaging. Doppler waveforms show normal direction of
venous flow, normal respiratory plasticity and response to
augmentation.

OTHER

No evidence of superficial thrombophlebitis or abnormal fluid
collection.

Limitations: none

LEFT LOWER EXTREMITY

VENOUS

Normal compressibility of the LEFT common femoral, superficial
femoral, and popliteal veins, as well as the visualized calf veins.
Visualized portions of profunda femoral vein and great saphenous
vein unremarkable. No filling defects to suggest DVT on grayscale or
color Doppler imaging. Doppler waveforms show normal direction of
venous flow, normal respiratory plasticity and response to
augmentation.

OTHER

No evidence of superficial thrombophlebitis or abnormal fluid
collection.

Limitations: none
IMPRESSION: No evidence of femoropopliteal DVT within either lower extremity.

## 2022-03-14 IMAGING — CT CT ABD-PELV W/ CM
2 of 4 series · 15 of 46 positions shown, 17 images · IV contrast (Omnipaque or Isovue)
Comparison: None.

CLINICAL DATA: Mid abdominal pain times several weeks

EXAM:
CT ABDOMEN AND PELVIS WITH CONTRAST
TECHNIQUE: Multidetector CT imaging of the abdomen and pelvis was performed
using the standard protocol following bolus administration of
intravenous contrast.

[Series 2: axial st · axial · 0.79mm/px · z∈[+876,+1316]mm · 12 of 100 slices shown, 14 images]
[im 8/100  soft-tissue]
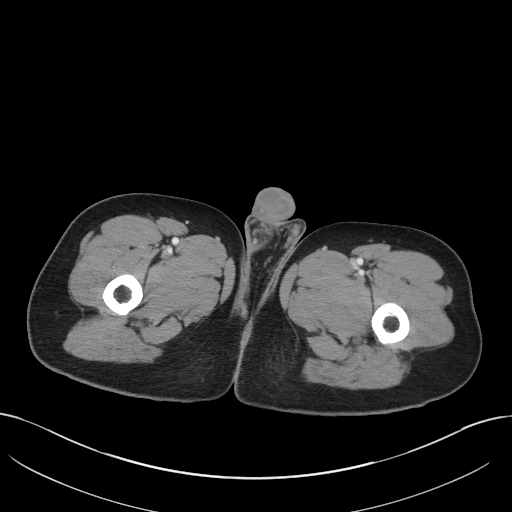
[im 8/100  bone]
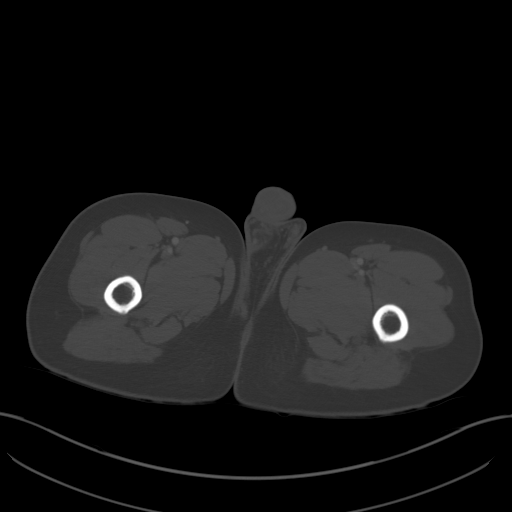
[im 16/100  soft-tissue]
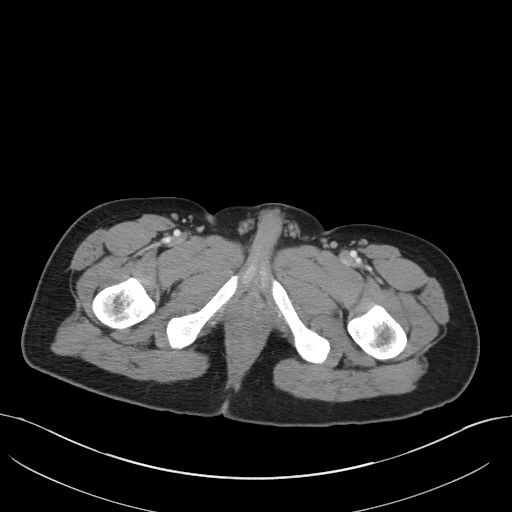
[im 24/100  soft-tissue]
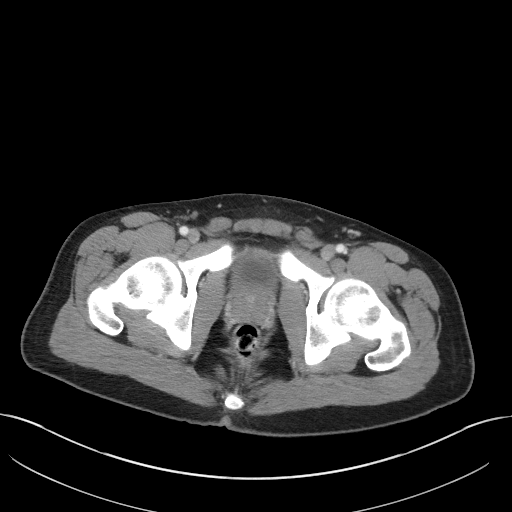
[im 32/100  soft-tissue]
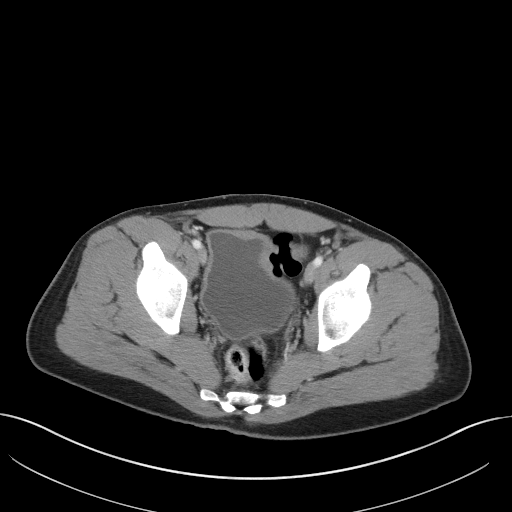
[im 40/100  soft-tissue]
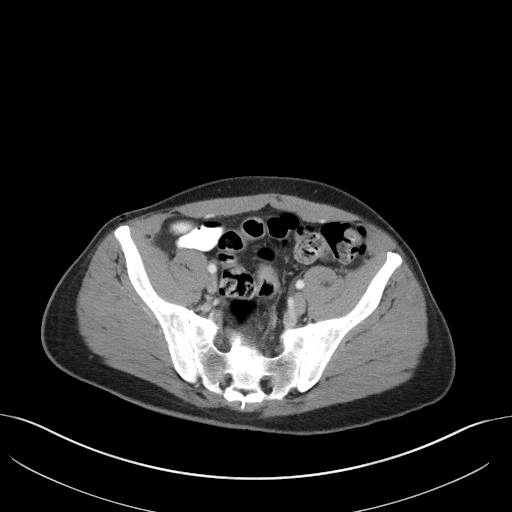
[im 48/100  soft-tissue]
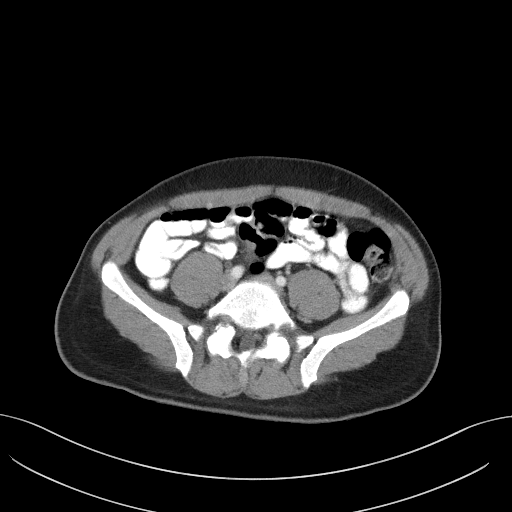
[im 56/100  soft-tissue]
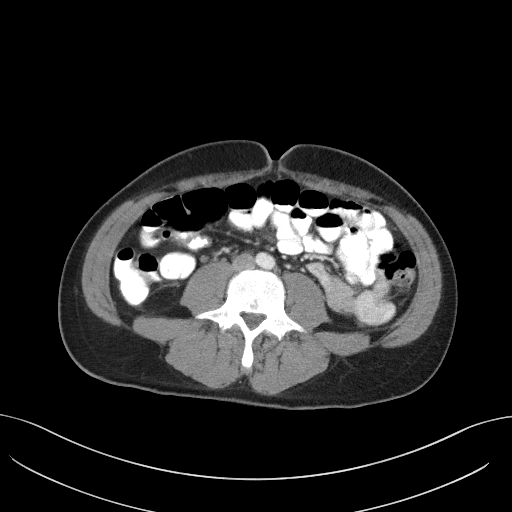
[im 64/100  soft-tissue]
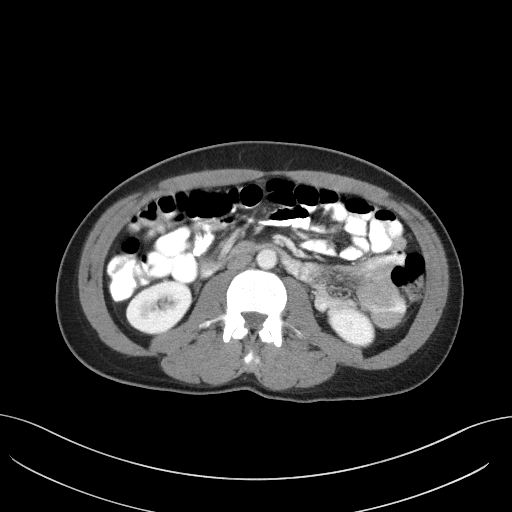
[im 72/100  soft-tissue]
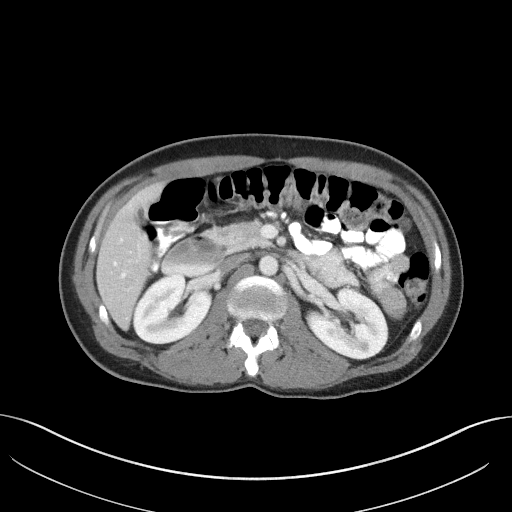
[im 72/100  bone]
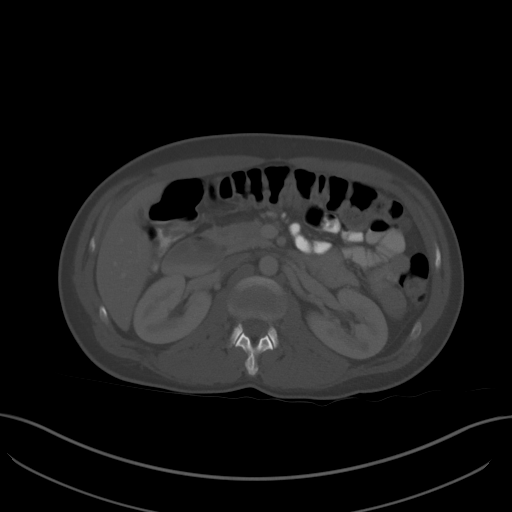
[im 80/100  soft-tissue]
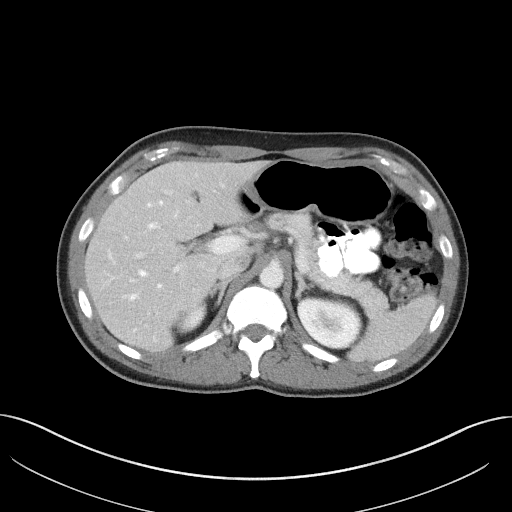
[im 88/100  soft-tissue]
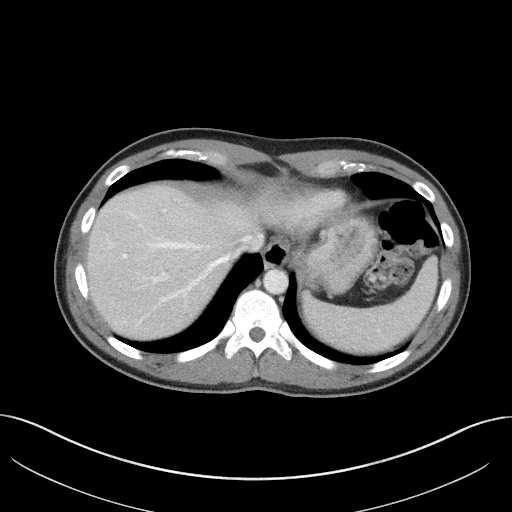
[im 96/100  soft-tissue]
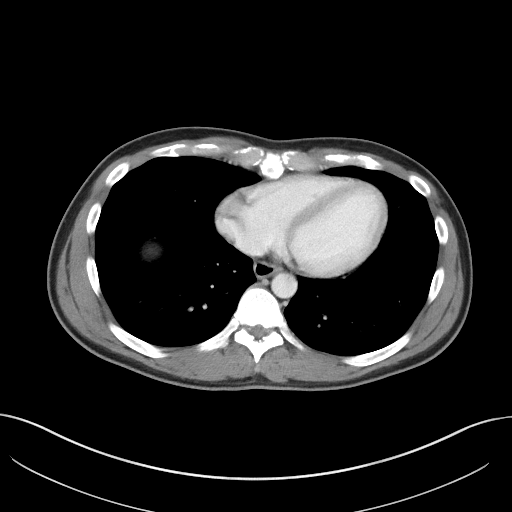

[Series 5: coronal st · coronal · 0.72mm/px · 3 of 88 slices shown]
[im 30/88  soft-tissue]
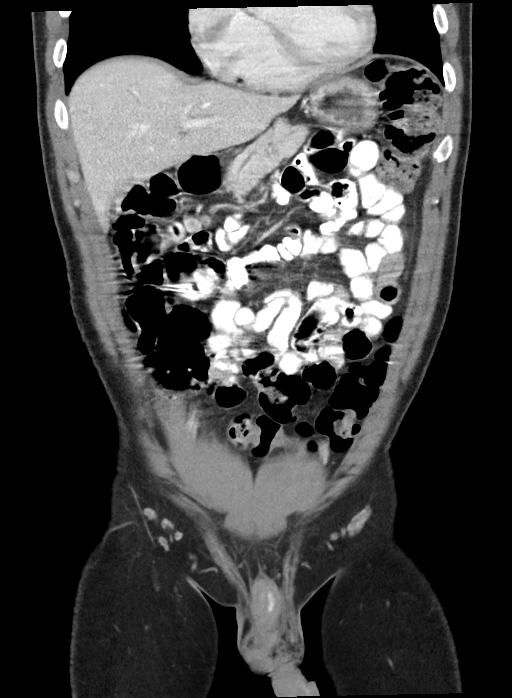
[im 39/88  soft-tissue]
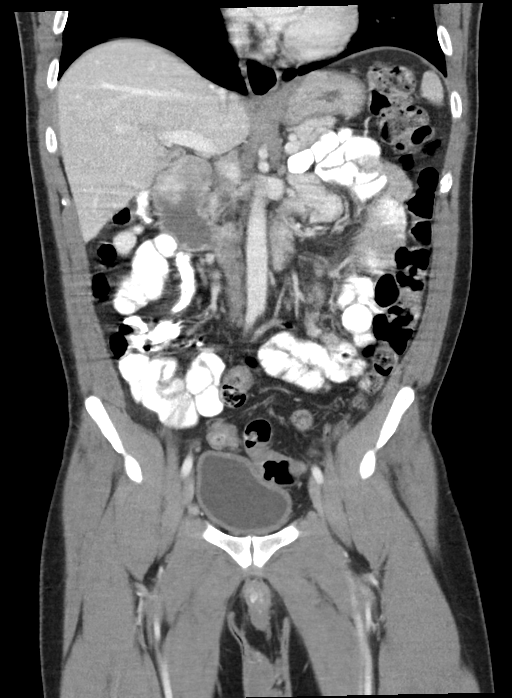
[im 49/88  soft-tissue]
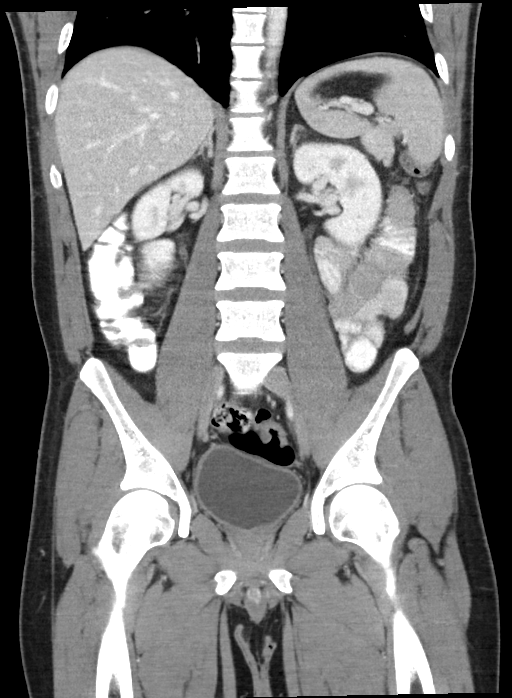

[15 of 46 positions shown; findings below may reference images not displayed]

RADIATION DOSE REDUCTION: This exam was performed according to the
departmental dose-optimization program which includes automated
exposure control, adjustment of the mA and/or kV according to
patient size and/or use of iterative reconstruction technique.

CONTRAST:  100mL OMNIPAQUE IOHEXOL 300 MG/ML  SOLN
FINDINGS: Lower chest: No acute abnormality.

Hepatobiliary: No suspicious hepatic lesion. Gallbladder is
decompressed. No biliary ductal dilation.

Pancreas: No pancreatic ductal dilation or evidence of acute
inflammation.

Spleen: Normal in size without focal abnormality.

Adrenals/Urinary Tract: Bilateral adrenal glands appear normal. No
hydronephrosis. Hypodense subcentimeter left interpolar renal lesion
is technically too small to accurately characterize but
statistically likely reflect cysts. Kidneys demonstrate symmetric
enhancement. Urinary bladder is unremarkable for degree of
distension.

Stomach/Bowel: Radiopaque enteric contrast material traverses the
ascending colon. Stomach is unremarkable for degree of distension.
No pathologic dilation of small or large bowel. Terminal ileum
appears normal. The appendix is not confidently identified however
there is no pericecal inflammation. Left-sided colonic
diverticulosis without findings of acute diverticulitis.

Vascular/Lymphatic: No significant vascular findings are present. No
enlarged abdominal or pelvic lymph nodes.

Reproductive: Prostate is unremarkable.

Other: No abdominal wall hernia or abnormality. No abdominopelvic
ascites.

Musculoskeletal: Left proximal femoral enchondroma. No acute osseous
abnormality or aggressive lytic or blastic lesion of bone.
IMPRESSION: 1. No acute abdominopelvic findings.
2. Left-sided colonic diverticulosis without findings of acute
diverticulitis.

## 2022-04-23 ENCOUNTER — Other Ambulatory Visit: Payer: Self-pay

## 2022-04-23 ENCOUNTER — Encounter (HOSPITAL_COMMUNITY): Payer: Self-pay

## 2022-04-23 ENCOUNTER — Emergency Department (HOSPITAL_COMMUNITY)
Admission: EM | Admit: 2022-04-23 | Discharge: 2022-04-24 | Disposition: A | Discharge status: Home / Self Care | Payer: Medicaid Other | Attending: Emergency Medicine | Admitting: Emergency Medicine

## 2022-04-23 ENCOUNTER — Emergency Department (HOSPITAL_COMMUNITY): Payer: Medicaid Other

## 2022-04-23 DIAGNOSIS — R110 Nausea: Secondary | ICD-10-CM

## 2022-04-23 DIAGNOSIS — F1193 Opioid use, unspecified with withdrawal: Secondary | ICD-10-CM

## 2022-04-23 DIAGNOSIS — R000 Tachycardia, unspecified: Secondary | ICD-10-CM | POA: Diagnosis not present

## 2022-04-23 DIAGNOSIS — Z1152 Encounter for screening for COVID-19: Secondary | ICD-10-CM | POA: Insufficient documentation

## 2022-04-23 DIAGNOSIS — D72829 Elevated white blood cell count, unspecified: Secondary | ICD-10-CM | POA: Diagnosis not present

## 2022-04-23 DIAGNOSIS — E876 Hypokalemia: Secondary | ICD-10-CM

## 2022-04-23 DIAGNOSIS — R112 Nausea with vomiting, unspecified: Secondary | ICD-10-CM | POA: Diagnosis present

## 2022-04-23 LAB — COMPREHENSIVE METABOLIC PANEL
ALT: 18 U/L (ref 0–44)
AST: 20 U/L (ref 15–41)
Albumin: 4.7 g/dL (ref 3.5–5.0)
Alkaline Phosphatase: 89 U/L (ref 38–126)
Anion gap: 14 (ref 5–15)
BUN: 32 mg/dL — ABNORMAL HIGH (ref 6–20)
CO2: 31 mmol/L (ref 22–32)
Calcium: 9.1 mg/dL (ref 8.9–10.3)
Chloride: 91 mmol/L — ABNORMAL LOW (ref 98–111)
Creatinine, Ser: 1.01 mg/dL (ref 0.61–1.24)
GFR, Estimated: >60 mL/min (ref >60)
Glucose, Bld: 120 mg/dL — ABNORMAL HIGH (ref 70–99)
Potassium: 2.5 mmol/L — CL (ref 3.5–5.1)
Sodium: 136 mmol/L (ref 135–145)
Total Bilirubin: 1 mg/dL (ref 0.3–1.2)
Total Protein: 8.4 g/dL — ABNORMAL HIGH (ref 6.5–8.1)

## 2022-04-23 LAB — CBC WITH DIFFERENTIAL/PLATELET
Abs Immature Granulocytes: 0.05 10*3/uL (ref 0.00–0.07)
Basophils Absolute: 0 10*3/uL (ref 0.0–0.1)
Basophils Relative: 0 %
Eosinophils Absolute: 0 10*3/uL (ref 0.0–0.5)
Eosinophils Relative: 0 %
HCT: 42.5 % (ref 39.0–52.0)
Hemoglobin: 15 g/dL (ref 13.0–17.0)
Immature Granulocytes: 0 %
Lymphocytes Relative: 13 %
Lymphs Abs: 2.2 10*3/uL (ref 0.7–4.0)
MCH: 27.9 pg (ref 26.0–34.0)
MCHC: 35.3 g/dL (ref 30.0–36.0)
MCV: 79.1 fL — ABNORMAL LOW (ref 80.0–100.0)
Monocytes Absolute: 1.6 10*3/uL — ABNORMAL HIGH (ref 0.1–1.0)
Monocytes Relative: 9 %
Neutro Abs: 13.3 10*3/uL — ABNORMAL HIGH (ref 1.7–7.7)
Neutrophils Relative %: 78 %
Platelets: 366 10*3/uL (ref 150–400)
RBC: 5.37 MIL/uL (ref 4.22–5.81)
RDW: 13.1 % (ref 11.5–15.5)
WBC: 17.3 10*3/uL — ABNORMAL HIGH (ref 4.0–10.5)
nRBC: 0 % (ref 0.0–0.2)

## 2022-04-23 LAB — LIPASE, BLOOD: Lipase: 28 U/L (ref 11–51)

## 2022-04-23 LAB — RESP PANEL BY RT-PCR (RSV, FLU A&B, COVID)  RVPGX2
Influenza A by PCR: NEGATIVE
Influenza B by PCR: NEGATIVE
Resp Syncytial Virus by PCR: NEGATIVE
SARS Coronavirus 2 by RT PCR: NEGATIVE

## 2022-04-23 LAB — MAGNESIUM: Magnesium: 2.4 mg/dL (ref 1.7–2.4)

## 2022-04-23 MED ORDER — ONDANSETRON HCL 4 MG/2ML IJ SOLN
4.0000 mg | Freq: Once | INTRAMUSCULAR | Status: AC
  Administered 2022-04-23: 4 mg via INTRAVENOUS
  Filled 2022-04-23: qty 2

## 2022-04-23 MED ORDER — SODIUM CHLORIDE 0.9 % IV BOLUS
1000.0000 mL | Freq: Once | INTRAVENOUS | Status: AC
  Administered 2022-04-23: 1000 mL via INTRAVENOUS

## 2022-04-23 MED ORDER — POTASSIUM CHLORIDE CRYS ER 20 MEQ PO TBCR
40.0000 meq | EXTENDED_RELEASE_TABLET | Freq: Once | ORAL | Status: AC
  Administered 2022-04-23: 40 meq via ORAL
  Filled 2022-04-23: qty 2

## 2022-04-23 MED ORDER — MORPHINE SULFATE (PF) 4 MG/ML IV SOLN
4.0000 mg | Freq: Once | INTRAVENOUS | Status: AC
  Administered 2022-04-23: 4 mg via INTRAVENOUS
  Filled 2022-04-23: qty 1

## 2022-04-23 MED ORDER — METOCLOPRAMIDE HCL 5 MG/ML IJ SOLN
10.0000 mg | Freq: Once | INTRAMUSCULAR | Status: AC
  Administered 2022-04-23: 10 mg via INTRAVENOUS
  Filled 2022-04-23: qty 2

## 2022-04-23 MED ORDER — DIAZEPAM 5 MG/ML IJ SOLN
2.5000 mg | Freq: Once | INTRAMUSCULAR | Status: AC
  Administered 2022-04-23: 2.5 mg via INTRAVENOUS
  Filled 2022-04-23: qty 2

## 2022-04-23 MED ORDER — POTASSIUM CHLORIDE 10 MEQ/100ML IV SOLN
10.0000 meq | INTRAVENOUS | Status: AC
  Administered 2022-04-23 – 2022-04-24 (×4): 10 meq via INTRAVENOUS
  Filled 2022-04-23 (×4): qty 100

## 2022-04-23 NOTE — ED Provider Notes (Incomplete)
Care assumed from [facility], PA-C, patient with emesis with severe hypokalemia, also leukocytosis.  He is getting IV fluids and potassium, urinalysis pending.  Anticipate discharge.

## 2022-04-23 NOTE — ED Provider Notes (Signed)
Care assumed from [facility], PA-C, patient with emesis with severe hypokalemia, also leukocytosis.  He is getting IV fluids and potassium, urinalysis pending.  Anticipate discharge.  Urinalysis shows no evidence of UTI.  Magnesium level is normal.  He continues to have nausea.  I have ordered additional IV fluids and prochlorperazine.  Following above-noted treatment, nausea is improved and he has tolerated oral fluids.  He is felt to be safe for discharge.  I am discharging him with a prescription for prochlorperazine and also potassium.  Patient states that he had lost his prescription for haloperidol 1 mg which she takes once a day.  I do not see this on his medication list, but I am also giving him a new prescription for that.  He is complaining of some shaking, it is noted that he is supposed to be taking benztropine but has been choosing not to take it.  I have recommended that he start taking it and I am giving him a new prescription for that medication.   Delora Fuel, MD [id number] [phone]

## 2022-04-23 NOTE — ED Notes (Signed)
Date and time results received: 04/23/22 1001   Test: Potassium Critical Value: 2.5  Name of Provider Notified: Philip Aspen  Orders Received? Or Actions Taken?: NA

## 2022-04-23 NOTE — ED Notes (Signed)
Pt to radiology via stretcher by RT.

## 2022-04-23 NOTE — ED Notes (Signed)
Dr. Patterson at bedside.  
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
## 2022-04-23 NOTE — ED Triage Notes (Signed)
Pt brought in by mom from home with c/o emesis that started 2 days ago, pt reports having abd pain at first, that has now subsided, but nausea and vomiting continued. Pt says he hasn't vomited in the past few hours, but still feels nauseated.

## 2022-04-24 LAB — URINALYSIS, MICROSCOPIC (REFLEX)

## 2022-04-24 LAB — URINALYSIS, ROUTINE W REFLEX MICROSCOPIC
Bilirubin Urine: NEGATIVE
Glucose, UA: NEGATIVE mg/dL
Hgb urine dipstick: NEGATIVE
Ketones, ur: 15 mg/dL — AB
Leukocytes,Ua: NEGATIVE
Nitrite: NEGATIVE
Protein, ur: 30 mg/dL — AB
Specific Gravity, Urine: 1.02 (ref 1.005–1.030)
pH: 7 (ref 5.0–8.0)

## 2022-04-24 MED ORDER — PROCHLORPERAZINE EDISYLATE 10 MG/2ML IJ SOLN
10.0000 mg | Freq: Once | INTRAMUSCULAR | Status: AC
  Administered 2022-04-24: 10 mg via INTRAVENOUS
  Filled 2022-04-24: qty 2

## 2022-04-24 MED ORDER — BENZTROPINE MESYLATE 0.5 MG PO TABS: 0.5000 mg | ORAL_TABLET | Freq: Two times a day (BID) | ORAL | 0 refills | Status: DC

## 2022-04-24 MED ORDER — HALOPERIDOL 1 MG PO TABS: 1.0000 mg | ORAL_TABLET | Freq: Every day | ORAL | 0 refills | Status: AC

## 2022-04-24 MED ORDER — DIAZEPAM 5 MG PO TABS
5.0000 mg | ORAL_TABLET | Freq: Once | ORAL | Status: AC
  Administered 2022-04-24: 5 mg via ORAL
  Filled 2022-04-24: qty 1

## 2022-04-24 MED ORDER — PROCHLORPERAZINE MALEATE 10 MG PO TABS: 10.0000 mg | ORAL_TABLET | Freq: Four times a day (QID) | ORAL | 0 refills | Status: AC | PRN

## 2022-04-24 MED ORDER — POTASSIUM CHLORIDE CRYS ER 20 MEQ PO TBCR: 20.0000 meq | EXTENDED_RELEASE_TABLET | Freq: Two times a day (BID) | ORAL | 0 refills | Status: AC

## 2022-04-24 MED ORDER — LACTATED RINGERS IV BOLUS
1000.0000 mL | Freq: Once | INTRAVENOUS | Status: AC
  Administered 2022-04-24: 1000 mL via INTRAVENOUS

## 2022-04-24 NOTE — ED Notes (Signed)
Dr. Glick at bedside.  

## 2022-05-12 ENCOUNTER — Ambulatory Visit: Payer: Medicaid Other | Admitting: Internal Medicine

## 2022-09-23 ENCOUNTER — Encounter (HOSPITAL_COMMUNITY): Payer: Self-pay

## 2022-09-23 ENCOUNTER — Other Ambulatory Visit: Payer: Self-pay

## 2022-09-23 ENCOUNTER — Emergency Department (HOSPITAL_COMMUNITY)
Admission: EM | Admit: 2022-09-23 | Discharge: 2022-09-24 | Disposition: A | Discharge status: Home / Self Care | Payer: MEDICAID | Attending: Emergency Medicine | Admitting: Emergency Medicine

## 2022-09-23 DIAGNOSIS — K649 Unspecified hemorrhoids: Secondary | ICD-10-CM | POA: Diagnosis not present

## 2022-09-23 DIAGNOSIS — K5641 Fecal impaction: Secondary | ICD-10-CM

## 2022-09-23 DIAGNOSIS — Z87891 Personal history of nicotine dependence: Secondary | ICD-10-CM | POA: Insufficient documentation

## 2022-09-23 NOTE — ED Triage Notes (Signed)
Pt c/o hemorrhoids causing pain x 4 days that won't let him sit, have a BM & is making him dizzy. Reports last BM 7 days ago that was difficult to pass & was as large as a brick.

## 2022-09-24 ENCOUNTER — Emergency Department (HOSPITAL_COMMUNITY): Payer: MEDICAID

## 2022-09-24 LAB — COMPREHENSIVE METABOLIC PANEL
ALT: 17 U/L (ref 0–44)
AST: 16 U/L (ref 15–41)
Albumin: 4.4 g/dL (ref 3.5–5.0)
Alkaline Phosphatase: 86 U/L (ref 38–126)
Anion gap: 10 (ref 5–15)
BUN: 11 mg/dL (ref 6–20)
CO2: 25 mmol/L (ref 22–32)
Calcium: 9.5 mg/dL (ref 8.9–10.3)
Chloride: 101 mmol/L (ref 98–111)
Creatinine, Ser: 0.87 mg/dL (ref 0.61–1.24)
GFR, Estimated: >60 mL/min (ref >60)
Glucose, Bld: 105 mg/dL — ABNORMAL HIGH (ref 70–99)
Potassium: 3.8 mmol/L (ref 3.5–5.1)
Sodium: 136 mmol/L (ref 135–145)
Total Bilirubin: 0.7 mg/dL (ref 0.3–1.2)
Total Protein: 7.7 g/dL (ref 6.5–8.1)

## 2022-09-24 LAB — CBC
HCT: 40.7 % (ref 39.0–52.0)
Hemoglobin: 14 g/dL (ref 13.0–17.0)
MCH: 28.4 pg (ref 26.0–34.0)
MCHC: 34.4 g/dL (ref 30.0–36.0)
MCV: 82.6 fL (ref 80.0–100.0)
Platelets: 243 10*3/uL (ref 150–400)
RBC: 4.93 MIL/uL (ref 4.22–5.81)
RDW: 12.2 % (ref 11.5–15.5)
WBC: 9.7 10*3/uL (ref 4.0–10.5)
nRBC: 0 % (ref 0.0–0.2)

## 2022-09-24 MED ORDER — KETOROLAC TROMETHAMINE 15 MG/ML IJ SOLN
30.0000 mg | Freq: Once | INTRAMUSCULAR | Status: AC
  Administered 2022-09-24: 30 mg via INTRAVENOUS
  Filled 2022-09-24: qty 2

## 2022-09-24 MED ORDER — MORPHINE SULFATE (PF) 4 MG/ML IV SOLN
4.0000 mg | Freq: Once | INTRAVENOUS | Status: AC
  Administered 2022-09-24: 4 mg via INTRAVENOUS
  Filled 2022-09-24: qty 1

## 2022-09-24 MED ORDER — IOHEXOL 300 MG/ML  SOLN
100.0000 mL | Freq: Once | INTRAMUSCULAR | Status: AC | PRN
  Administered 2022-09-24: 100 mL via INTRAVENOUS

## 2022-09-24 MED ORDER — BISACODYL 10 MG RE SUPP: 10.0000 mg | RECTAL | 0 refills | Status: AC | PRN

## 2022-09-24 MED ORDER — POLYETHYLENE GLYCOL 3350 17 G PO PACK: 17.0000 g | PACK | Freq: Every day | ORAL | 1 refills | Status: DC

## 2022-09-24 NOTE — Discharge Instructions (Signed)
You were evaluated in the Emergency Department and after careful evaluation, we did not find any emergent condition requiring admission or further testing in the hospital.  Your exam/testing today was overall reassuring.  Symptoms were due to a fecal impaction.  We did a disimpaction procedure here in the emergency department.  Important that you continue stool softeners, suppositories, etc. at home to treat any remaining constipation.  Please return to the Emergency Department if you experience any worsening of your condition.  Thank you for allowing [place] to be a part of your care.

## 2022-09-24 NOTE — ED Provider Notes (Signed)
[facility] DEPT [facility] Emergency Department Provider Note MRN:  [id number]  Arrival date & time: 09/24/22     Chief Complaint   Hemorrhoids   History of Present Illness   Ronald Hoffman is a 42 y.o. year-old male with a history of substance-induced psychotic disorder presenting to the ED with chief complaint of hemorrhoids.  Severe rectal pain, hesitancy to have bowel movements for the past day or 2.  Feeling sick and unwell.  Review of Systems  A thorough review of systems was obtained and all systems are negative except as noted in the HPI and PMH.   Patient's Health History    Past Medical History:  Diagnosis Date   Acute psychosis (HCC) 10/26/2019   Anxiety    Delusional disorder (HCC) 10/25/2019   Drug abuse (HCC) 03/19/2020   Hyperthyroidism    Lactic acidosis 02/21/2021   Seizures (HCC)    stress related; no more seizures since first one 10 years ago; on no meds.   Sepsis (HCC)    SIRS (systemic inflammatory response syndrome) (HCC) 02/20/2021   Substance-induced psychotic disorder (HCC) 03/19/2020   Tachycardia     Past Surgical History:  Procedure Laterality Date   HEMORRHOID SURGERY N/A 11/12/2015   Procedure: EXTENSIVE HEMORRHOIDECTOMY;  Surgeon: Franky Macho, MD;  Location: [facility] OR[facility];  Service: General;  Laterality: N/A;    History reviewed. No pertinent family history.  Social History   Socioeconomic History   Marital status: Single    Spouse name: Not on file   Number of children: Not on file   Years of education: Not on file   Highest education level: Not on file  Occupational History   Not on file  Tobacco Use   Smoking status: Former    Current packs/day: 0.50    Average packs/day: 0.5 packs/day for 10.0 years (5.0 ttl pk-yrs)    Types: Cigarettes   Smokeless tobacco: Never  Vaping Use   Vaping status: Every Day  Substance and Sexual Activity   Alcohol use: Yes    Comment: last drink 10/25/2019, pt said he had 3 beers   Drug use: Yes     Types: Heroin, Methamphetamines, Other-see comments    Comment: was using synthetic heroin daily for 2 years but stopped 2 months ago, methamphetamine daily for 3 months until June (last use 10/25/2019- "just a pinch"), "mushrooms yesterday," and "ketamine today"   Sexual activity: Never    Birth control/protection: None  Other Topics Concern   Not on file  Social History Narrative   Not on file   Social Determinants of Health   Financial Resource Strain: Low Risk  (06/24/2022)   Received from [facility]   Overall Financial Resource Strain (CARDIA)    Difficulty of Paying Living Expenses: Not hard at all  Food Insecurity: No Food Insecurity (06/24/2022)   Received [facility]   Hunger Vital Sign    Worried About Running Out of Food in the Last Year: Never true    Ran Out of Food in the Last Year: Never true  Transportation Needs: No Transportation Needs (06/24/2022)   Received from [facility]   PRAPARE - Transportation    Lack of Transportation (Medical): No    Lack of Transportation (Non-Medical): No  Physical Activity: Insufficiently Active (06/24/2022)   Received from [facility]   Exercise Vital Sign    Days of Exercise per Week: 4 days    Minutes of Exercise per Session: 30 min  Stress: Stress Concern Present (06/24/2022)   Received from [facility] of Occupational Health - Occupational Stress Questionnaire    Feeling of Stress : To some extent  Social Connections: Moderately Integrated (06/24/2022)   Received from [facility]   Social Connection and Isolation Panel [NHANES]    Frequency of Communication with Friends and Family: Twice a week    Frequency of Social Gatherings with Friends and Family: Twice a week    Attends Religious Services: 1 to 4 times per year    Active Member of Clubs or Organizations: No    Attends Banker Meetings: 1 to 4 times per year    Marital Status: Never married  Intimate Partner  Violence: Not At Risk (06/24/2022)   Received from [facility]   Humiliation, Afraid, Rape, and Kick questionnaire    Fear of Current or Ex-Partner: No    Emotionally Abused: No    Physically Abused: No    Sexually Abused: No     Physical Exam   Vitals:   09/24/22 0345 09/24/22 0400  BP: 111/67 (!) 145/128  Pulse: (!) 47 (!) 59  Resp:  18  Temp:    SpO2: 98% 97%    CONSTITUTIONAL: Chronically ill-appearing, NAD NEURO/PSYCH:  Alert and oriented x 3, no focal deficits EYES:  eyes equal and reactive ENT/NECK:  no LAD, no JVD CARDIO: Regular rate, well-perfused, normal S1 and S2 PULM:  CTAB no wheezing or rhonchi GI/GU:  non-distended, non-tender MSK/SPINE:  No gross deformities, no edema SKIN:  no rash, atraumatic   *Additional and/or pertinent findings included in MDM below  Diagnostic and Interventional Summary    EKG Interpretation Date/Time:    Ventricular Rate:    PR Interval:    QRS Duration:    QT Interval:    QTC Calculation:   R Axis:      Text Interpretation:         Labs Reviewed  COMPREHENSIVE METABOLIC PANEL - Abnormal; Notable for the following components:      Result Value   Glucose, Bld 105 (*)    All other components within normal limits  CBC    CT PELVIS W CONTRAST  Final Result      Medications  ketorolac (TORADOL) 15 MG/ML injection 30 mg (30 mg Intravenous Given 09/24/22 0236)  morphine (PF) 4 MG/ML injection 4 mg (4 mg Intravenous Given 09/24/22 0238)  iohexol (OMNIPAQUE) 300 MG/ML solution 100 mL (100 mLs Intravenous Contrast Given 09/24/22 0255)     Procedures  /  Critical Care Fecal disimpaction  Date/Time: 09/24/2022 4:08 AM  Performed by: Sabas Sous, MD Authorized by: Sabas Sous, MD  Consent: Verbal consent obtained. Risks and benefits: risks, benefits and alternatives were discussed Consent given by: patient Patient understanding: patient states understanding of the procedure being performed Imaging studies:  imaging studies available Patient identity confirmed: verbally with patient Time out: Immediately prior to procedure a "time out" was called to verify the correct patient, procedure, equipment, support staff and site/side marked as required. Local anesthesia used: no  Anesthesia: Local anesthesia used: no  Sedation: Patient sedated: no  Patient tolerance: patient tolerated the procedure well with no immediate complications Comments: Several large very firm stool balls removed from the rectal vault     ED Course and Medical Decision Making  Initial Impression and Ddx Patient having severe rectal pain and hesitancy to have bowel movements.  There is no obvious pathology  to the anus on exam, no hemorrhoids visible, no perianal abscess.  Question fecal impaction, perirectal abscess.  Past medical/surgical history that increases complexity of ED encounter: Psychiatric illness  Interpretation of Diagnostics I personally reviewed the laboratory assessment and my interpretation is as follows: No significant blood count or electrolyte disturbance  CT pelvis without signs of abscess.  Evidence of fecal impaction  Patient Reassessment and Ultimate Disposition/Management     Patient agrees to fecal disimpaction attempt, as described above.  Appropriate for discharge.  Patient management required discussion with the following services or consulting groups:  None  Complexity of Problems Addressed Acute illness or injury that poses threat of life of bodily function  Additional Data Reviewed and Analyzed Further history obtained from: Further history from spouse/family member  Additional Factors Impacting ED Encounter Risk Prescriptions, Minor Procedures, and Use of parenteral controlled substances  Elmer Sow. Pilar Plate, MD [facility] Emergency Medicine [facility] [email or web address]  Final Clinical Impressions(s) / ED Diagnoses     ICD-10-CM   1. Fecal impaction  (HCC)  K56.41       ED Discharge Orders          Ordered    bisacodyl (DULCOLAX) 10 MG suppository  As needed        09/24/22 0408    polyethylene glycol (MIRALAX) 17 g packet  Daily        09/24/22 0408             Discharge Instructions Discussed with and Provided to Patient:     Discharge Instructions      You were evaluated in the Emergency Department and after careful evaluation, we did not find any emergent condition requiring admission or further testing in the hospital.  Your exam/testing today was overall reassuring.  Symptoms were due to a fecal impaction.  We did a disimpaction procedure here in the emergency department.  Important that you continue stool softeners, suppositories, etc. at home to treat any remaining constipation.  Please return to the Emergency Department if you experience any worsening of your condition.  Thank you for allowing [place] to be a part of your care.        Sabas Sous, MD 09/24/22 [phone]

## 2022-09-24 NOTE — ED Notes (Addendum)
Patient states he feels better and does not want a CT scan. Patient standing and ambulating; in no apparent distress. MD notified

## 2022-09-24 NOTE — ED Notes (Signed)
Patient reports pain in sacrum that started "this bad today". Patient lying on bed in a ball, moaning in pain

## 2022-09-24 NOTE — ED Notes (Signed)
Patient transported to CT 

## 2022-10-08 ENCOUNTER — Other Ambulatory Visit (HOSPITAL_BASED_OUTPATIENT_CLINIC_OR_DEPARTMENT_OTHER): Payer: Self-pay

## 2023-03-12 ENCOUNTER — Emergency Department (HOSPITAL_COMMUNITY): Payer: Medicare Other

## 2023-03-12 ENCOUNTER — Emergency Department (HOSPITAL_COMMUNITY)
Admission: EM | Admit: 2023-03-12 | Discharge: 2023-03-12 | Disposition: A | Discharge status: Home / Self Care | Payer: Medicare Other | Attending: Emergency Medicine | Admitting: Emergency Medicine

## 2023-03-12 DIAGNOSIS — D72829 Elevated white blood cell count, unspecified: Secondary | ICD-10-CM | POA: Diagnosis not present

## 2023-03-12 DIAGNOSIS — R4182 Altered mental status, unspecified: Secondary | ICD-10-CM | POA: Diagnosis present

## 2023-03-12 DIAGNOSIS — K5904 Chronic idiopathic constipation: Secondary | ICD-10-CM | POA: Insufficient documentation

## 2023-03-12 DIAGNOSIS — F200 Paranoid schizophrenia: Secondary | ICD-10-CM | POA: Insufficient documentation

## 2023-03-12 DIAGNOSIS — K5900 Constipation, unspecified: Secondary | ICD-10-CM | POA: Diagnosis present

## 2023-03-12 LAB — CBC WITH DIFFERENTIAL/PLATELET
Abs Immature Granulocytes: 0.03 10*3/uL (ref 0.00–0.07)
Basophils Absolute: 0.1 10*3/uL (ref 0.0–0.1)
Basophils Relative: 0 %
Eosinophils Absolute: 0.1 10*3/uL (ref 0.0–0.5)
Eosinophils Relative: 1 %
HCT: 40.2 % (ref 39.0–52.0)
Hemoglobin: 14 g/dL (ref 13.0–17.0)
Immature Granulocytes: 0 %
Lymphocytes Relative: 10 %
Lymphs Abs: 1.4 10*3/uL (ref 0.7–4.0)
MCH: 28.8 pg (ref 26.0–34.0)
MCHC: 34.8 g/dL (ref 30.0–36.0)
MCV: 82.7 fL (ref 80.0–100.0)
Monocytes Absolute: 0.6 10*3/uL (ref 0.1–1.0)
Monocytes Relative: 4 %
Neutro Abs: 12.2 10*3/uL — ABNORMAL HIGH (ref 1.7–7.7)
Neutrophils Relative %: 85 %
Platelets: 384 10*3/uL (ref 150–400)
RBC: 4.86 MIL/uL (ref 4.22–5.81)
RDW: 12.8 % (ref 11.5–15.5)
WBC: 14.3 10*3/uL — ABNORMAL HIGH (ref 4.0–10.5)
nRBC: 0 % (ref 0.0–0.2)

## 2023-03-12 LAB — COMPREHENSIVE METABOLIC PANEL
ALT: 26 U/L (ref 0–44)
AST: 24 U/L (ref 15–41)
Albumin: 4.1 g/dL (ref 3.5–5.0)
Alkaline Phosphatase: 88 U/L (ref 38–126)
Anion gap: 13 (ref 5–15)
BUN: 13 mg/dL (ref 6–20)
CO2: 24 mmol/L (ref 22–32)
Calcium: 9.5 mg/dL (ref 8.9–10.3)
Chloride: 106 mmol/L (ref 98–111)
Creatinine, Ser: 0.79 mg/dL (ref 0.61–1.24)
GFR, Estimated: >60 mL/min (ref >60)
Glucose, Bld: 113 mg/dL — ABNORMAL HIGH (ref 70–99)
Potassium: 3.6 mmol/L (ref 3.5–5.1)
Sodium: 143 mmol/L (ref 135–145)
Total Bilirubin: 0.6 mg/dL (ref 0.0–1.2)
Total Protein: 7.4 g/dL (ref 6.5–8.1)

## 2023-03-12 LAB — I-STAT CHEM 8, ED
BUN: 12 mg/dL (ref 6–20)
Calcium, Ion: 1.19 mmol/L (ref 1.15–1.40)
Chloride: 108 mmol/L (ref 98–111)
Creatinine, Ser: 0.8 mg/dL (ref 0.61–1.24)
Glucose, Bld: 114 mg/dL — ABNORMAL HIGH (ref 70–99)
HCT: 42 % (ref 39.0–52.0)
Hemoglobin: 14.3 g/dL (ref 13.0–17.0)
Potassium: 3.6 mmol/L (ref 3.5–5.1)
Sodium: 144 mmol/L (ref 135–145)
TCO2: 25 mmol/L (ref 22–32)

## 2023-03-12 LAB — LIPASE, BLOOD: Lipase: 25 U/L (ref 11–51)

## 2023-03-12 MED ORDER — RISPERIDONE 1 MG PO TABS: 1.0000 mg | ORAL_TABLET | Freq: Every day | ORAL | Status: DC

## 2023-03-12 MED ORDER — LORAZEPAM 2 MG/ML IJ SOLN
1.0000 mg | Freq: Once | INTRAMUSCULAR | Status: AC
  Administered 2023-03-12: 1 mg via INTRAVENOUS
  Filled 2023-03-12: qty 1

## 2023-03-12 MED ORDER — POLYETHYLENE GLYCOL 3350 17 G PO PACK: 17.0000 g | PACK | Freq: Every day | ORAL | 1 refills | Status: AC

## 2023-03-12 MED ORDER — IOHEXOL 300 MG/ML  SOLN
100.0000 mL | Freq: Once | INTRAMUSCULAR | Status: AC | PRN
  Administered 2023-03-12: 75 mL via INTRAVENOUS

## 2023-03-12 MED ORDER — MORPHINE SULFATE (PF) 4 MG/ML IV SOLN
2.0000 mg | Freq: Once | INTRAVENOUS | Status: AC
  Administered 2023-03-12: 2 mg via INTRAVENOUS
  Filled 2023-03-12: qty 1

## 2023-03-12 NOTE — ED Notes (Addendum)
Patient got out of bed stood up and urinated in floor. Assisted patient back to bed. Floor cleaned. Patient still nonverbal, just grunting and moaning.

## 2023-03-12 NOTE — ED Notes (Signed)
Pt is actively vomiting with family at bedside. He states he is having pain but will not specify location or severity of discomfort.

## 2023-03-12 NOTE — ED Provider Notes (Signed)
[place] EMERGENCY DEPARTMENT AT [facility] Provider Note   CSN: [id number] Arrival date & time: 03/12/23  4098     History  Chief Complaint  Patient presents with   Constipation    Ronald Hoffman is a 43 y.o. male with PMHx substance-induced psychotic disorder, schizophrenia, anxiety, hyperthyroidism, seizures who presents to the ED concerned for constipation. EMS spoke to brother at home who states that patient has went 2 weeks without a BM. EMS stating that further history is difficult to obtain from patient given his psychotic disorder and patient being a poor historian. Patient with hx of hemorrhoids which cause difficulties with BM's. Patient making eye contact and answering some yes/no questions, but is not providing any further information.   Called contact number in chart at 11:54AM, 11:55AM, and 1:06PM for further information. Left HIPAA compliant voicemail. No response. Was eventually able to get in contact with Harriett Sine who states that patient has been having troubles with constipation and not taking his medications.  Patient's brother eventually appearing to bedside stating that patient has not been able to take his schizophrenia medications in 6 days because they have not picked up his prescriptions from the pharmacy. Brother also stating that patient ended up having a very large bowel movement just before EMS picked him up. Patient's father also eventually visiting patient in his room and provided patient with a vape which I confiscated from patient.    HPI     Home Medications Prior to Admission medications   Medication Sig Start Date End Date Taking? Authorizing Provider  benztropine (COGENTIN) 0.5 MG tablet Take 1 tablet (0.5 mg total) by mouth 2 (two) times daily. 04/24/22   Dione Booze, MD  bisacodyl (DULCOLAX) 10 MG suppository Place 1 suppository (10 mg total) rectally as needed for moderate constipation. 09/24/22   Sabas Sous, MD   buprenorphine-naloxone (SUBOXONE) 8-2 mg SUBL SL tablet Place 1 tablet under the tongue daily. Patient not taking: Reported on 03/09/2022 11/20/21   Clapacs, Jackquline Denmark, MD  gabapentin (NEURONTIN) 300 MG capsule Take 2 capsules (600 mg total) by mouth 3 (three) times daily. 11/19/21   Clapacs, Jackquline Denmark, MD  haloperidol (HALDOL) 1 MG tablet Take 1 tablet (1 mg total) by mouth daily. 04/24/22   Dione Booze, MD  hydrOXYzine (ATARAX) 50 MG tablet Take 1 tablet (50 mg total) by mouth every 6 (six) hours as needed for anxiety. Patient not taking: Reported on 03/09/2022 11/19/21   Clapacs, Jackquline Denmark, MD  methocarbamol (ROBAXIN) 500 MG tablet Take 1 tablet (500 mg total) by mouth 3 (three) times daily. 11/19/21   Clapacs, Jackquline Denmark, MD  OLANZapine (ZYPREXA) 10 MG tablet Take 10 mg by mouth at bedtime. 12/15/21   [provider]  pantoprazole (PROTONIX) 40 MG tablet Take 1 tablet (40 mg total) by mouth 2 (two) times daily. Patient not taking: Reported on 03/09/2022 11/19/21   Clapacs, Jackquline Denmark, MD  polyethylene glycol (MIRALAX) 17 g packet Take 17 g by mouth daily. 03/12/23   Valrie Hart F, PA-C  potassium chloride SA (KLOR-CON M) 20 MEQ tablet Take 1 tablet (20 mEq total) by mouth 2 (two) times daily. 04/24/22   Dione Booze, MD  prochlorperazine (COMPAZINE) 10 MG tablet Take 1 tablet (10 mg total) by mouth every 6 (six) hours as needed for nausea or vomiting. 04/24/22   Dione Booze, MD  QUEtiapine (SEROQUEL) 200 MG tablet Take 1 tablet (200 mg total) by mouth at bedtime. 03/10/22  Bethann Berkshire, MD  risperiDONE (RISPERDAL) 1 MG tablet Take 1.5 tablets (1.5 mg total) by mouth daily. 03/10/22   Bethann Berkshire, MD  thiamine (VITAMIN B1) 100 MG tablet Take 1 tablet (100 mg total) by mouth daily. Patient not taking: Reported on 03/09/2022 11/20/21   Clapacs, Jackquline Denmark, MD  traZODone (DESYREL) 100 MG tablet Take 1 tablet (100 mg total) by mouth at bedtime as needed for sleep. Patient not taking: Reported on 03/09/2022  11/19/21   Clapacs, Jackquline Denmark, MD      Allergies    Hydrocodone, Penicillin g, and Penicillins    Review of Systems   Review of Systems  Gastrointestinal:  Positive for constipation.    Physical Exam Updated Vital Signs BP (!) 102/57 (BP Location: Left Arm)   Pulse 79   Temp (!) 97.5 F (36.4 C) (Axillary)   Resp 18   SpO2 95%  Physical Exam Vitals and nursing note reviewed.  Constitutional:      General: He is not in acute distress.    Appearance: He is not ill-appearing or toxic-appearing.  HENT:     Head: Normocephalic and atraumatic.     Mouth/Throat:     Mouth: Mucous membranes are moist.  Eyes:     General: No scleral icterus.       Right eye: No discharge.        Left eye: No discharge.     Conjunctiva/sclera: Conjunctivae normal.  Cardiovascular:     Rate and Rhythm: Normal rate and regular rhythm.     Pulses: Normal pulses.     Heart sounds: Normal heart sounds. No murmur heard. Pulmonary:     Effort: Pulmonary effort is normal. No respiratory distress.     Breath sounds: Normal breath sounds. No wheezing, rhonchi or rales.  Abdominal:     General: Abdomen is flat. Bowel sounds are normal. There is no distension.     Palpations: Abdomen is soft. There is no mass.     Tenderness: There is no abdominal tenderness.  Musculoskeletal:     Right lower leg: No edema.     Left lower leg: No edema.  Skin:    General: Skin is warm and dry.     Findings: No rash.  Neurological:     General: No focal deficit present.     Mental Status: He is alert. Mental status is at baseline.  Psychiatric:     Comments: Patient initially with clenched jaw and responding to questions with head nodding.     ED Results / Procedures / Treatments   Labs (all labs ordered are listed, but only abnormal results are displayed) Labs Reviewed  CBC WITH DIFFERENTIAL/PLATELET - Abnormal; Notable for the following components:      Result Value   WBC 14.3 (*)    Neutro Abs 12.2 (*)     All other components within normal limits  COMPREHENSIVE METABOLIC PANEL - Abnormal; Notable for the following components:   Glucose, Bld 113 (*)    All other components within normal limits  I-STAT CHEM 8, ED - Abnormal; Notable for the following components:   Glucose, Bld 114 (*)    All other components within normal limits  LIPASE, BLOOD  RAPID URINE DRUG SCREEN, HOSP PERFORMED    EKG None  Radiology CT Head Wo Contrast Result Date: 03/12/2023 CLINICAL DATA:  Mental status change. EXAM: CT HEAD WITHOUT CONTRAST TECHNIQUE: Contiguous axial images were obtained from the base of the skull through the vertex without  intravenous contrast. RADIATION DOSE REDUCTION: This exam was performed according to the departmental dose-optimization program which includes automated exposure control, adjustment of the mA and/or kV according to patient size and/or use of iterative reconstruction technique. COMPARISON:  Five 4 2009 FINDINGS: Motion degradation. Brain: No acute intracranial hemorrhage. No focal mass lesion. No CT evidence of acute infarction. No midline shift or mass effect. No hydrocephalus. Basilar cisterns are patent. Vascular: No hyperdense vessel or unexpected calcification. Skull: Normal. Negative for fracture or focal lesion. Sinuses/Orbits: Paranasal sinuses and mastoid air cells are clear. Orbits are clear. Other: None. IMPRESSION: 1. No acute intracranial findings. 2. Motion degradation exam. Electronically Signed   By: Genevive Bi M.D.   On: 03/12/2023 16:47   CT ABDOMEN PELVIS W CONTRAST Result Date: 03/12/2023 CLINICAL DATA:  Abdominal pain.  Constipation for 2 weeks. EXAM: CT ABDOMEN AND PELVIS WITH CONTRAST TECHNIQUE: Multidetector CT imaging of the abdomen and pelvis was performed using the standard protocol following bolus administration of intravenous contrast. RADIATION DOSE REDUCTION: This exam was performed according to the departmental dose-optimization program which  includes automated exposure control, adjustment of the mA and/or kV according to patient size and/or use of iterative reconstruction technique. CONTRAST:  75mL OMNIPAQUE IOHEXOL 300 MG/ML  SOLN COMPARISON:  CT pelvis dated September 24, 2022. CT abdomen/pelvis dated November 09, 2021. FINDINGS: Lower chest: No acute abnormality. Hepatobiliary: No focal liver abnormality is seen. No gallstones, gallbladder wall thickening, or biliary dilatation. Pancreas: Unremarkable. No pancreatic ductal dilatation or surrounding inflammatory changes. Spleen: Normal in size without focal abnormality. Adrenals/Urinary Tract: Adrenal glands are unremarkable. Redemonstrated 8 mm focal hypodensity in the interpolar left kidney which measures 33 Hounsfield units and may represent a proteinaceous/hemorrhagic cyst. No renal calculi. Bladder is distended and otherwise unremarkable. Stomach/Bowel: Gaseous distention of the stomach. Appendix appears normal. The small bowel is collapsed, limiting detailed evaluation. There is diffuse gaseous distention with scattered stool throughout the colon, most compatible with colonic ileus. No definite evidence of obstruction. No focal inflammatory changes. Vascular/Lymphatic: Abdominal aorta is normal in caliber. No enlarged abdominal or pelvic lymph nodes. Reproductive: Prostate is unremarkable. Other: No abdominopelvic ascites. No intraperitoneal free air. No abdominal wall hernia. Musculoskeletal: No acute or significant osseous findings. IMPRESSION: 1. Diffuse gaseous distention with scattered stool throughout the colon, most compatible with colonic ileus. No definite evidence of obstruction. 2. Pronounced gaseous distention of the stomach is nonspecific and could be secondary to gastric outlet obstruction or gastroparesis. Electronically Signed   By: Hart Robinsons M.D.   On: 03/12/2023 12:07    Procedures Procedures    Medications Ordered in ED Medications  iohexol (OMNIPAQUE) 300 MG/ML  solution 100 mL (75 mLs Intravenous Contrast Given 03/12/23 1132)  LORazepam (ATIVAN) injection 1 mg (1 mg Intravenous Given 03/12/23 1653)  morphine (PF) 4 MG/ML injection 2 mg (2 mg Intravenous Given 03/12/23 1655)    ED Course/ Medical Decision Making/ A&P                                 Medical Decision Making Amount and/or Complexity of Data Reviewed Labs: ordered. Radiology: ordered.  Risk Prescription drug management.    This patient presents to the ED for concern of abdominal pain, this involves an extensive number of treatment options, and is a complaint that carries with it a high risk of complications and morbidity.  The differential diagnosis includes gastroenteritis, colitis, small bowel obstruction, appendicitis, cholecystitis,  pancreatitis, nephrolithiasis, UTI, pyelonephritis, constipation   Co morbidities that complicate the patient evaluation  substance-induced psychotic disorder, schizophrenia, anxiety, hyperthyroidism, seizures    Additional history obtained:  Additional history obtained from 09/2022 ED note: patient seen in ED for similar complaints, CT showing small stool impaction   Problem List / ED Course / Critical interventions / Medication management  Patient presented for constipation. Apparently patient had a very large BM just before EMS brought him to ED. Patient initially not verbally answering questions and jaw is clenched but intermittently nods yes/no to some questions. Brother stating that this is not baseline for patient but father later stating that these symptoms happen to patient at least once a month. Brother stating that patient has not taken any of his mental health medications for at least 6 days because they have not picked up prescription from pharmacy.  I Ordered, and personally interpreted labs.  Lipase within normal limits.  CMP reassuring.  CBC with leukocytosis of 14.3 which seems to be due to patient's recent abdominal pain and  constipation which has since resolved.   I ordered imaging studies including CT Abd/Pelvis with contrast/CT head: evaluate for structural/surgical etiology of patients' severe abdominal pain. I independently visualized and interpreted imaging and I agree with the radiologist interpretation of ileus without obstruction. CT head without acute concern. Patient with hx of polysubstance abuse which I believe is contributing to his symptoms of constipation. Brother also stating that patient may be suffering from withdrawals so I provided patient with 2mg  morphine.  Patient initially not answering questions verbally which resolved after 2mg  morphine and 1mg  ativan. Patient ambulated in ED without difficulties. Patient stating that he feels a lot better now that he had a large BM this morning. Denies any other infectious symptoms today. Had one episode of vomiting in ED but which has resolved and patient tolerating PO intake. Patient stating that he is ready to go home. Offered to prescribe patient his schizophrenia medications - patient and family member declined stating that the prescription is ready at [facility] and they just need to go and pick it up. Brother stating that he will drive patient home and stop by [facility] to get the medications. Will also prescribe Miralax to help with constipation/ileus. Recommended following up with PCP. Patient verbalized understanding of plan. I have reviewed the patients home medicines and have made adjustments as needed Patient afebrile with stable vitals.  Provided with return precautions.  Discharged in good condition.  Ddx: These are considered less likely due to history of present illness and physical exam. -gastroenteritis: No vomiting in ED; no fever; tolerating PO intake -colitis: Denies diarrhea, patient afebrile  -appendicitis: Negative McBurney point, rebound tenderness, psoas, obturator sign  -cholecystitis: Negative Murphy sign; liver enzymes within normal limits   -pancreatitis: No LUQ tenderness to palpation, lipase within normal limits  -nephrolithiasis: Denies flank pain and urinary complaints  -UTI/pyelonephritis: Denies urinary complaints    Social Determinants of Health:  schizophrenia          Final Clinical Impression(s) / ED Diagnoses Final diagnoses:  Chronic idiopathic constipation  Paranoid schizophrenia (HCC)    Rx / DC Orders ED Discharge Orders          Ordered    polyethylene glycol (MIRALAX) 17 g packet  Daily        03/12/23 1800              Dorthy Cooler, [place] 03/12/23 1923    Terrilee Files, MD  03/13/23 1036  

## 2023-03-12 NOTE — Discharge Instructions (Addendum)
It was a pleasure caring for you today. Workup today was reassuring. Please follow up with your primary care provider. Seek emergency care if experiencing any new or worsening symptoms

## 2023-03-12 NOTE — ED Notes (Signed)
Pt. Vomited small amount yellow bile.

## 2023-03-12 NOTE — ED Notes (Signed)
PO challenge attempted. Pt. Did drink about 60 mL of water but then would not take anymore.

## 2023-03-12 NOTE — ED Triage Notes (Signed)
Pt. BIB [facility]. On arrival pt. Could speak and would say "butt pain" and noted he has not pooped in "2 weeks." Pt.'s brother was on site and noted patient to have multiple personality disorder and schizophrenia and will go through interment periods where he doesn't speak, clenches his teeth, and will "not breathe." During triage he is not speaking, will look at me, but not follow commands. Pt. Is also shaky.

## 2023-04-14 ENCOUNTER — Encounter (HOSPITAL_COMMUNITY): Payer: Self-pay

## 2023-04-14 ENCOUNTER — Emergency Department (HOSPITAL_COMMUNITY)
Admission: EM | Admit: 2023-04-14 | Discharge: 2023-04-15 | Disposition: A | Discharge status: Home / Self Care | Attending: Student | Admitting: Student

## 2023-04-14 ENCOUNTER — Other Ambulatory Visit: Payer: Self-pay

## 2023-04-14 DIAGNOSIS — Z87891 Personal history of nicotine dependence: Secondary | ICD-10-CM | POA: Diagnosis not present

## 2023-04-14 DIAGNOSIS — R6884 Jaw pain: Secondary | ICD-10-CM | POA: Insufficient documentation

## 2023-04-14 DIAGNOSIS — G249 Dystonia, unspecified: Secondary | ICD-10-CM

## 2023-04-14 MED ORDER — BENZTROPINE MESYLATE 1 MG/ML IJ SOLN
2.0000 mg | Freq: Once | INTRAMUSCULAR | Status: DC
  Filled 2023-04-14: qty 2

## 2023-04-14 NOTE — ED Triage Notes (Signed)
 Pt arrived via POV c/o Lock Jaw X 2 days. Pt reports recently having a similar problem a couple of weeks ago and symptoms resolved after receiving IV fluids and morphine. Pt still able to talk and drink fluids through a straw. Pt denies injury.

## 2023-04-14 NOTE — ED Notes (Signed)
 Pt reports trying to see his doctor earlier today for this but was advised to come to the ER for treatment.

## 2023-04-15 MED ORDER — BENZTROPINE MESYLATE 0.5 MG PO TABS: 0.5000 mg | ORAL_TABLET | Freq: Two times a day (BID) | ORAL | 0 refills | Status: AC

## 2023-04-15 MED ORDER — DIPHENHYDRAMINE HCL 50 MG/ML IJ SOLN
50.0000 mg | Freq: Once | INTRAMUSCULAR | Status: AC
  Administered 2023-04-15: 50 mg via INTRAMUSCULAR
  Filled 2023-04-15: qty 1

## 2023-04-15 NOTE — ED Notes (Signed)
 Pt is able to eat crackers without any pain

## 2023-04-15 NOTE — ED Provider Notes (Signed)
 [place] EMERGENCY DEPARTMENT AT [facility] Provider Note  CSN: [id number] Arrival date & time: 04/14/23 1829  Chief Complaint(s) Jaw Pain  HPI Ronald Hoffman is a 43 y.o. male with PMH polysubstance abuse, seizure disorder, psychosis and schizophrenia on Risperdal who presents emergency room for evaluation of jaw pain and locked jaw.  Was previously on an antipsychotic regimen that included Cogentin but he was recently switched to Risperdal.  States that he is having difficulty opening his jaw eating drinking and talking.  Denies associated chest pain, shortness of breath, headache, fever or other systemic symptoms.   Past Medical History Past Medical History:  Diagnosis Date   Acute psychosis (HCC) 10/26/2019   Anxiety    Delusional disorder (HCC) 10/25/2019   Drug abuse (HCC) 03/19/2020   Hyperthyroidism    Lactic acidosis 02/21/2021   Seizures (HCC)    stress related; no more seizures since first one 10 years ago; on no meds.   Sepsis (HCC)    SIRS (systemic inflammatory response syndrome) (HCC) 02/20/2021   Substance-induced psychotic disorder (HCC) 03/19/2020   Tachycardia    Patient Active Problem List   Diagnosis Date Noted   Schizophrenia (HCC) 11/13/2021   Alcohol abuse 11/13/2021   Cocaine abuse (HCC) 11/13/2021   Delusional disorder, erotomanic type, first episode, currently in acute episode (HCC) 11/11/2021   Nausea & vomiting 11/09/2021   Coffee ground emesis 11/09/2021   SVT (supraventricular tachycardia) 11/09/2021   AKI (acute kidney injury) (HCC) 11/09/2021   Leukocytosis 11/09/2021   Hypokalemia 11/09/2021   Hyperthyroidism    Lactic acidosis 02/21/2021   Sepsis (HCC)    Tachycardia    SIRS (systemic inflammatory response syndrome) (HCC) 02/20/2021   Drug abuse (HCC) 03/19/2020   Substance-induced psychotic disorder (HCC) 03/19/2020   Acute psychosis (HCC) 10/26/2019   Delusional disorder (HCC) 10/25/2019   Home Medication(s) Prior to Admission  medications   Medication Sig Start Date End Date Taking? Authorizing Provider  benztropine (COGENTIN) 0.5 MG tablet Take 1 tablet (0.5 mg total) by mouth 2 (two) times daily. 04/24/22   Dione Booze, MD  bisacodyl (DULCOLAX) 10 MG suppository Place 1 suppository (10 mg total) rectally as needed for moderate constipation. 09/24/22   Sabas Sous, MD  buprenorphine-naloxone (SUBOXONE) 8-2 mg SUBL SL tablet Place 1 tablet under the tongue daily. Patient not taking: Reported on 03/09/2022 11/20/21   Clapacs, Jackquline Denmark, MD  gabapentin (NEURONTIN) 300 MG capsule Take 2 capsules (600 mg total) by mouth 3 (three) times daily. 11/19/21   Clapacs, Jackquline Denmark, MD  haloperidol (HALDOL) 1 MG tablet Take 1 tablet (1 mg total) by mouth daily. 04/24/22   Dione Booze, MD  hydrOXYzine (ATARAX) 50 MG tablet Take 1 tablet (50 mg total) by mouth every 6 (six) hours as needed for anxiety. Patient not taking: Reported on 03/09/2022 11/19/21   Clapacs, Jackquline Denmark, MD  methocarbamol (ROBAXIN) 500 MG tablet Take 1 tablet (500 mg total) by mouth 3 (three) times daily. 11/19/21   Clapacs, Jackquline Denmark, MD  OLANZapine (ZYPREXA) 10 MG tablet Take 10 mg by mouth at bedtime. 12/15/21   [provider]  pantoprazole (PROTONIX) 40 MG tablet Take 1 tablet (40 mg total) by mouth 2 (two) times daily. Patient not taking: Reported on 03/09/2022 11/19/21   Clapacs, Jackquline Denmark, MD  polyethylene glycol (MIRALAX) 17 g packet Take 17 g by mouth daily. 03/12/23   Valrie Hart F, PA-C  potassium chloride SA (KLOR-CON M) 20 MEQ tablet Take  1 tablet (20 mEq total) by mouth 2 (two) times daily. 04/24/22   Dione Booze, MD  prochlorperazine (COMPAZINE) 10 MG tablet Take 1 tablet (10 mg total) by mouth every 6 (six) hours as needed for nausea or vomiting. 04/24/22   Dione Booze, MD  QUEtiapine (SEROQUEL) 200 MG tablet Take 1 tablet (200 mg total) by mouth at bedtime. 03/10/22   Bethann Berkshire, MD  risperiDONE (RISPERDAL) 1 MG tablet Take 1.5 tablets (1.5 mg  total) by mouth daily. 03/10/22   Bethann Berkshire, MD  thiamine (VITAMIN B1) 100 MG tablet Take 1 tablet (100 mg total) by mouth daily. Patient not taking: Reported on 03/09/2022 11/20/21   Clapacs, Jackquline Denmark, MD  traZODone (DESYREL) 100 MG tablet Take 1 tablet (100 mg total) by mouth at bedtime as needed for sleep. Patient not taking: Reported on 03/09/2022 11/19/21   Clapacs, Jackquline Denmark, MD                                                                                                                                    Past Surgical History Past Surgical History:  Procedure Laterality Date   HEMORRHOID SURGERY N/A 11/12/2015   Procedure: EXTENSIVE HEMORRHOIDECTOMY;  Surgeon: Franky Macho, MD;  Location: [facility];  Service: General;  Laterality: N/A;   Family History History reviewed. No pertinent family history.  Social History Social History   Tobacco Use   Smoking status: Former    Current packs/day: 0.50    Average packs/day: 0.5 packs/day for 10.0 years (5.0 ttl pk-yrs)    Types: Cigarettes   Smokeless tobacco: Never  Vaping Use   Vaping status: Every Day  Substance Use Topics   Alcohol use: Yes    Comment: last drink 10/25/2019, pt said he had 3 beers   Drug use: Yes    Types: Heroin, Methamphetamines, Other-see comments    Comment: was using synthetic heroin daily for 2 years but stopped 2 months ago, methamphetamine daily for 3 months until June (last use 10/25/2019- "just a pinch"), "mushrooms yesterday," and "ketamine today"   Allergies Hydrocodone, Penicillin g, and Penicillins  Review of Systems Review of Systems  HENT:         Locked jaw    Physical Exam Vital Signs  I have reviewed the triage vital signs BP (!) 131/96 (BP Location: Right Arm)   Pulse (!) 116   Temp 98.2 F (36.8 C) (Oral)   Resp 16   Ht 5\' 5"  (1.651 m)   Wt 65.8 kg   SpO2 96%   BMI 24.14 kg/m   Physical Exam Vitals and nursing note reviewed.  Constitutional:      General: He is not in acute  distress.    Appearance: He is well-developed.  HENT:     Head: Normocephalic and atraumatic.  Eyes:     Conjunctiva/sclera: Conjunctivae normal.  Cardiovascular:     Rate and Rhythm: Normal rate  and regular rhythm.     Heart sounds: No murmur heard. Pulmonary:     Effort: Pulmonary effort is normal. No respiratory distress.     Breath sounds: Normal breath sounds.  Abdominal:     Palpations: Abdomen is soft.     Tenderness: There is no abdominal tenderness.  Musculoskeletal:        General: No swelling.     Cervical back: Neck supple.  Skin:    General: Skin is warm and dry.     Capillary Refill: Capillary refill takes less than 2 seconds.  Neurological:     Mental Status: He is alert.  Psychiatric:        Mood and Affect: Mood normal.     ED Results and Treatments Labs (all labs ordered are listed, but only abnormal results are displayed) Labs Reviewed - No data to display                                                                                                                        Radiology No results found.  Pertinent labs & imaging results that were available during my care of the patient were reviewed by me and considered in my medical decision making (see MDM for details).  Medications Ordered in ED Medications  diphenhydrAMINE (BENADRYL) injection 50 mg (50 mg Intramuscular Given 04/15/23 0048)                                                                                                                                     Procedures Procedures  (including critical care time)  Medical Decision Making / ED Course   This patient presents to the ED for concern of jaw stiffness, this involves an extensive number of treatment options, and is a complaint that carries with it a high risk of complications and morbidity.  The differential diagnosis includes dystonic reaction, substance use, medication side effect, tetanus, TMJ  MDM: Patient seen emergency  room for evaluation of jaw stiffness.  Physical exam with some mild trismus but is otherwise unremarkable.  Patient given intramuscular Benadryl and observed in the emergency room and for multiple hours.  On reevaluation, his jaw stiffness has resolved and he is able to ambulate, able to tolerate p.o. without difficulty.  States that he is feeling significant proved.  Do suspect likely dystonic reaction from his antipsychotic use.  Patient was previously prescribed benztropine and  may benefit from having this medication for either as needed or daily use.  I represcribed this medication and requested that the family call his primary care physician to ask how they would like him to take this medication with the Risperdal.  At this time with symptoms resolved he does not meet inpatient criteria for admission and will be discharged with outpatient follow-up.   Additional history obtained: -Additional history obtained from multiple family members -External records from outside source obtained and reviewed including: Chart review including previous notes, labs, imaging, consultation notes   Lab Tests: -I ordered, reviewed, and interpreted labs.   The pertinent results include:   Labs Reviewed - No data to display     Medicines ordered and prescription drug management: Meds ordered this encounter  Medications   DISCONTD: benztropine mesylate (COGENTIN) injection 2 mg   diphenhydrAMINE (BENADRYL) injection 50 mg    -I have reviewed the patients home medicines and have made adjustments as needed  Critical interventions none    Cardiac Monitoring: The patient was maintained on a cardiac monitor.  I personally viewed and interpreted the cardiac monitored which showed an underlying rhythm of: NSR  Social Determinants of Health:  Factors impacting patients care include: Polysubstance use history   Reevaluation: After the interventions noted above, I reevaluated the patient and found that they  have :improved  Co morbidities that complicate the patient evaluation  Past Medical History:  Diagnosis Date   Acute psychosis (HCC) 10/26/2019   Anxiety    Delusional disorder (HCC) 10/25/2019   Drug abuse (HCC) 03/19/2020   Hyperthyroidism    Lactic acidosis 02/21/2021   Seizures (HCC)    stress related; no more seizures since first one 10 years ago; on no meds.   Sepsis (HCC)    SIRS (systemic inflammatory response syndrome) (HCC) 02/20/2021   Substance-induced psychotic disorder (HCC) 03/19/2020   Tachycardia       Dispostion: I considered admission for this patient, but at this time he does not meet inpatient criteria for admission and will be discharged with outpatient follow-up.     Final Clinical Impression(s) / ED Diagnoses Final diagnoses:  None     @PCDICTATION @    Glendora Score, MD 04/15/23 [phone]

## 2023-05-19 DIAGNOSIS — Z5181 Encounter for therapeutic drug level monitoring: Secondary | ICD-10-CM | POA: Diagnosis not present

## 2023-05-19 DIAGNOSIS — F1110 Opioid abuse, uncomplicated: Secondary | ICD-10-CM | POA: Diagnosis not present

## 2023-05-19 DIAGNOSIS — F429 Obsessive-compulsive disorder, unspecified: Secondary | ICD-10-CM | POA: Diagnosis not present

## 2023-05-19 DIAGNOSIS — F250 Schizoaffective disorder, bipolar type: Secondary | ICD-10-CM | POA: Diagnosis not present

## 2023-05-19 DIAGNOSIS — F419 Anxiety disorder, unspecified: Secondary | ICD-10-CM | POA: Diagnosis not present

## 2023-07-09 DIAGNOSIS — F209 Schizophrenia, unspecified: Secondary | ICD-10-CM | POA: Diagnosis not present

## 2023-07-09 DIAGNOSIS — F19959 Other psychoactive substance use, unspecified with psychoactive substance-induced psychotic disorder, unspecified: Secondary | ICD-10-CM | POA: Diagnosis not present

## 2023-07-09 DIAGNOSIS — K5903 Drug induced constipation: Secondary | ICD-10-CM | POA: Diagnosis not present

## 2023-10-21 DIAGNOSIS — G249 Dystonia, unspecified: Secondary | ICD-10-CM | POA: Diagnosis not present

## 2023-10-21 DIAGNOSIS — F209 Schizophrenia, unspecified: Secondary | ICD-10-CM | POA: Diagnosis not present

## 2023-12-16 DIAGNOSIS — F200 Paranoid schizophrenia: Secondary | ICD-10-CM | POA: Diagnosis not present

## 2023-12-16 DIAGNOSIS — G249 Dystonia, unspecified: Secondary | ICD-10-CM | POA: Diagnosis not present

## 2023-12-16 DIAGNOSIS — F209 Schizophrenia, unspecified: Secondary | ICD-10-CM | POA: Diagnosis not present

## 2023-12-16 DIAGNOSIS — Z23 Encounter for immunization: Secondary | ICD-10-CM | POA: Diagnosis not present
